# Patient Record
Sex: Male | Born: 1960 | Race: Black or African American | Hispanic: No | Marital: Married | Smoking: Never smoker
Health system: Southern US, Community
[De-identification: ages and names within clinical notes are randomized; demographics above are authoritative.]

## PROBLEM LIST (undated history)

## (undated) DIAGNOSIS — Z8719 Personal history of other diseases of the digestive system: Secondary | ICD-10-CM

## (undated) DIAGNOSIS — R002 Palpitations: Secondary | ICD-10-CM

## (undated) DIAGNOSIS — Z9989 Dependence on other enabling machines and devices: Secondary | ICD-10-CM

## (undated) DIAGNOSIS — E7800 Pure hypercholesterolemia, unspecified: Secondary | ICD-10-CM

## (undated) DIAGNOSIS — I208 Other forms of angina pectoris: Secondary | ICD-10-CM

## (undated) DIAGNOSIS — R7303 Prediabetes: Secondary | ICD-10-CM

## (undated) DIAGNOSIS — I10 Essential (primary) hypertension: Secondary | ICD-10-CM

## (undated) DIAGNOSIS — K219 Gastro-esophageal reflux disease without esophagitis: Secondary | ICD-10-CM

## (undated) DIAGNOSIS — M1990 Unspecified osteoarthritis, unspecified site: Secondary | ICD-10-CM

## (undated) DIAGNOSIS — G4733 Obstructive sleep apnea (adult) (pediatric): Secondary | ICD-10-CM

## (undated) DIAGNOSIS — J302 Other seasonal allergic rhinitis: Secondary | ICD-10-CM

## (undated) DIAGNOSIS — I2089 Other forms of angina pectoris: Secondary | ICD-10-CM

## (undated) HISTORY — PX: FRACTURE SURGERY: SHX138

---
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
## 1990-07-15 HISTORY — PX: NASAL SEPTUM SURGERY: SHX37

## 1998-04-18 ENCOUNTER — Ambulatory Visit (HOSPITAL_COMMUNITY): Admission: RE | Admit: 1998-04-18 | Discharge: 1998-04-18 | Payer: Self-pay | Admitting: *Deleted

## 2000-11-26 ENCOUNTER — Ambulatory Visit (HOSPITAL_COMMUNITY): Admission: RE | Admit: 2000-11-26 | Discharge: 2000-11-26 | Payer: Self-pay | Admitting: Surgery

## 2000-11-26 ENCOUNTER — Encounter (INDEPENDENT_AMBULATORY_CARE_PROVIDER_SITE_OTHER): Payer: Self-pay | Admitting: Specialist

## 2002-07-15 HISTORY — PX: LIPOMA EXCISION: SHX5283

## 2003-10-10 ENCOUNTER — Ambulatory Visit (HOSPITAL_COMMUNITY): Admission: RE | Admit: 2003-10-10 | Discharge: 2003-10-10 | Payer: Self-pay | Admitting: Family Medicine

## 2004-07-15 HISTORY — PX: ANKLE HARDWARE REMOVAL: SHX1149

## 2004-07-20 ENCOUNTER — Ambulatory Visit (HOSPITAL_BASED_OUTPATIENT_CLINIC_OR_DEPARTMENT_OTHER): Admission: RE | Admit: 2004-07-20 | Discharge: 2004-07-20 | Payer: Self-pay | Admitting: Family Medicine

## 2004-07-20 ENCOUNTER — Ambulatory Visit: Payer: Self-pay | Admitting: Internal Medicine

## 2005-11-13 ENCOUNTER — Encounter: Payer: Self-pay | Admitting: *Deleted

## 2006-07-15 HISTORY — PX: UVULOPALATOPHARYNGOPLASTY: SHX827

## 2006-07-15 HISTORY — PX: TONSILLECTOMY: SUR1361

## 2006-08-15 ENCOUNTER — Encounter (INDEPENDENT_AMBULATORY_CARE_PROVIDER_SITE_OTHER): Payer: Self-pay | Admitting: Specialist

## 2006-08-15 ENCOUNTER — Ambulatory Visit (HOSPITAL_COMMUNITY): Admission: RE | Admit: 2006-08-15 | Discharge: 2006-08-16 | Payer: Self-pay | Admitting: Otolaryngology

## 2006-10-30 ENCOUNTER — Ambulatory Visit (HOSPITAL_BASED_OUTPATIENT_CLINIC_OR_DEPARTMENT_OTHER): Admission: RE | Admit: 2006-10-30 | Discharge: 2006-10-30 | Payer: Self-pay | Admitting: Otolaryngology

## 2006-11-02 ENCOUNTER — Ambulatory Visit: Payer: Self-pay | Admitting: Internal Medicine

## 2006-12-16 ENCOUNTER — Other Ambulatory Visit: Admission: RE | Admit: 2006-12-16 | Discharge: 2006-12-16 | Payer: Self-pay | Admitting: Otolaryngology

## 2009-04-10 ENCOUNTER — Encounter: Admission: RE | Admit: 2009-04-10 | Discharge: 2009-04-10 | Payer: Self-pay | Admitting: Family Medicine

## 2010-01-19 ENCOUNTER — Encounter: Admission: RE | Admit: 2010-01-19 | Discharge: 2010-01-19 | Payer: Self-pay | Admitting: Family Medicine

## 2010-08-06 ENCOUNTER — Encounter: Payer: Self-pay | Admitting: Family Medicine

## 2010-11-30 NOTE — Op Note (Signed)
NAMEAURTHUR, WINGERTER NO.:  [id number]   MEDICAL RECORD NO.:  [id number]          PATIENT TYPE:  OIB   LOCATION:  2550                         FACILITY:  [facility]   PHYSICIAN:  Kinnie Scales. Annalee Genta, M.D.DATE OF BIRTH:  09/29/1960   DATE OF PROCEDURE:  08/15/2006  DATE OF DISCHARGE:                               OPERATIVE REPORT   PREOPERATIVE DIAGNOSES:  1. Severe obstructive sleep apnea.  2. Uvulopalatal hypertrophy.  3. Tonsillar hypertrophy.   POSTOPERATIVE DIAGNOSES:  1. Severe obstructive sleep apnea.  2. Uvulopalatal hypertrophy.  3. Tonsillar hypertrophy.   INDICATIONS FOR SURGERY:  1. Severe obstructive sleep apnea.  2. Uvulopalatal hypertrophy.  3. Tonsillar hypertrophy.   SURGICAL PROCEDURES:  1. Tonsillectomy.  2. Uvulopalatopharyngoplasty.   SURGEON:  Kinnie Scales. Annalee Genta, M.D.   COMPLICATIONS:  None.   BLOOD LOSS:  Approximately 50 mL.   ANESTHESIA:  General endotracheal.   The patient was transferred from the operating room to the recovery room  in stable condition.   BRIEF HISTORY:  Mr. Fludd is a 50 year old black male who is  referred for evaluation of obstructive sleep apnea.  The patient has  significant sleep apnea and had attempted long-term use of CPAP with  some limited success.  He is unable to wear the device comfortably on a  nightly basis.  He had a history of prior nasal surgery and had an  anterior nasal septal perforation.  Examination in the office revealed  significant enlargement of the tonsils, palate, uvula, and tongue base.  Given his history and physical examination including prior sleep study,  I recommended that he undertake tonsillectomy and  uvulopalatopharyngoplasty.  The risks, benefits and possible  complications of the surgical procedure were discussed in detail with  the patient, who understood and concurred with our plan for surgery,  which was scheduled at [facility] OR under  general  anesthesia.   PROCEDURE:  The patient was brought into the operating room August 15, 2006, placed in the supine position on the operating table.  General  endotracheal anesthesia was established without difficulty.  When the  patient was adequately anesthetized his oral cavity and oropharynx were  examined.  There were no loose or broken teeth, and the hard and soft  palate were intact.  The surgical procedure was begun with  tonsillectomy.  Using Bovie electrocautery and dissecting in a  subcapsular fashion, the entire left tonsil was resected from superior  pole to tongue base.  The right tonsil was removed in a similar fashion  and tonsil tissue was sent to pathology for gross and microscopic  evaluation.  The tonsillar fossae were gently abraded a dry tonsil  sponge and several areas of point hemorrhage were cauterized using  suction cautery.   When the tonsillectomy had been completed, uvulopalatopharyngoplasty was  undertaken.  The posterior extent of the soft palate was measured, and  the patient had approximately 1-1.5 cm of excessive soft tissue  including a significantly thickened uvula.  Dissection was carried out  along the anterior tonsillar pillars, dissecting through the anterior  palatal mucosa with Bovie electrocautery.  The  palatal musculature was  then dissected and a posterior mucosal flap was elevated.  The entire  uvula and a portion of the palate was resected and sent to pathology.  The posterior tonsillar pillars were left intact.  The area was  cauterized at several points of bleeding and then the soft tissue was  reconstructed using a 3-0 Vicryl suture on a tapered needle in an  interrupted fashion.  Along the posterior tonsillar pillars, horizontal  mattressing sutures were placed to advance the posterior pillars  anteriorly and close the tonsillar fossae along the poster palatal  margin.  Interrupted Vicryl sutures were placed in order to   reapproximate the mucosa.  The patient's oral cavity and oropharynx were  irrigated and suctioned and an orogastric tube was passed.  Stomach  contents were aspirated.  The Crowe-Davis mouth gag was released  reapplied.  There was no active bleeding.  Mouth gag was removed.  There  were no loose or broken teeth.  The patient was then awakened from his  anesthetic.  He was extubated.  He was then transferred from the  operating room to the recovery room in stable condition.  No  complications.  Blood loss less than 50 mL.           ______________________________  Kinnie Scales. Annalee Genta, M.D.     DLS/MEDQ  D:  16/04/9603  T:  08/15/2006  Job:  [id number]

## 2010-11-30 NOTE — Op Note (Signed)
[facility]  Patient:    Joshua Stanton, Joshua Stanton                 MRN: [id number] Proc. Date: 11/26/00 Adm. Date:  [id number] Attending:  Charlton Haws CC:         Arvella Merles, M.D.   Operative Report  ACCOUNT:  [id number]  [id number]  PREOPERATIVE DIAGNOSIS:  Recurrent lipoma of right posterior neck.  POSTOPERATIVE DIAGNOSIS:  Recurrent lipoma of right posterior neck.  OPERATION:  Excision of lipoma right posterior neck.  SURGEON:  Currie Paris, M.D.  ANESTHESIA:  General.  CLINICAL HISTORY:  This patient is a 50 year old male, who has got a recurrent lipoma following liposuction x 2 in the past.  It was getting somewhat larger and causing some discomfort, so he desired to have this removed.  DESCRIPTION OF PROCEDURE:  In the holding area, the area in question was identified and marked.  He was taken to the operating room and after satisfactory general endotracheal anesthesia had been obtained, was placed on the operating table in the prone position with appropriate padding.  However, this man has a fairly short neck, and this did not give [place] exposure and, in fact, made the mass more difficult to see.  We therefore moved him back to the stretcher and put him in the left side down lateral position with appropriate padding, and this gave [place] excellent exposure to the mass.  The neck was then prepped and draped.  I made an incision over it and excised some of the subcutaneous fatty tissue, trying to get around where I thought the mass was, but it was actually deeper and below some scar tissue and below a little bit of muscle tissue, but once I got down to it, it came out as a nice, well circumscribed lipoma.  It was excised in toto.  Bleeders were controlled with the cautery.  Once everything appeared to be dry, I injected some 0.25% Marcaine in the deeper layers to help with postoperative pain relief and then closed in two layers  using 3-0 Vicryl, one to close what appeared to be some old scar or perhaps fascia.  It was difficult to identify because of the prior liposuction, and then tacked the skin flaps down with some 3-0 Vicryl and then closed the skin with 4-0 Monocryl subcuticular plus Dermabond.  The patient tolerated the procedure well with no operative complications.  All counts were correct. DD:  11/26/00 TD:  11/26/00 Job: [id number]

## 2010-11-30 NOTE — Procedures (Signed)
NAME:  Joshua Stanton, Joshua Stanton NO.:  [id number]   MEDICAL RECORD NO.:  [id number]          PATIENT TYPE:  OUT   LOCATION:  SLEEP CENTER                 FACILITY:  [facility]   PHYSICIAN:  Clinton D. Maple Hudson, M.D. DATE OF BIRTH:  07-22-1960   DATE OF STUDY:                              NOCTURNAL POLYSOMNOGRAM   REFERRING PHYSICIAN:  Dr. Noberto Retort, IV.   INDICATION FOR STUDY AND HISTORY:  Hypersomnia with sleep apnea.   Epworth sleepiness score 13/24, BMI 40.7, weight 260 pounds, neck size 20  inches.   SLEEP ARCHITECTURE:  Short total sleep time 281 minutes with sleep  efficiency of 80%.  Stage I was 7%; stage II, 79%; Stages III and IV 50.  REM was 9% of total sleep time.  Latency to sleep onset 7 minutes, latency  to REM 73 minutes.  Awake after sleep onset 54 minutes.  Arousal index 40.   RESPIRATORY DATA:  Split study protocol.  Respiratory disturbance index  (RDI) 92.6 obstructive events per hour before CPAP.  This included 151  obstructive apneas and 48 hypopneas before CPAP.  Events were not  positional.  REM RDI 65.9 per hour.  CPAP was titrated to 13 CWP, RDI 3.9  per hour using a medium Comfort Gel mask with heated humidifier.   OXYGEN DATA:  Very loud snoring with oxygen desaturation to a nadir of 73%  before CPAP. After CPAP control, saturation held 92 to 96% on room air.  Snoring was eliminated by CPAP.   CARDIAC DATA:  Normal sinus rhythm.   MOVEMENT AND PARASOMNIA:  Occasional arousal by a leg jerk at an  insignificant 1.3 per hour.  Bathroom x 1.   IMPRESSION AND RECOMMENDATIONS:  1.  Severe obstructive sleep apnea/hypopnea syndrome, RDI 92.6 per hour with      oxygen desaturation to 73%.  2.  Successful CPAP titration to 13 CWP, RDI 3.9 per hour, using a medium      Comfort Gel mask with heated humidifier.   Clinton D. Maple Hudson, M.D.  Diplomate, Biomedical engineer of Sleep Medicine                                                           Clinton  D. Maple Hudson, M.D.  Diplomate, [employer] Board   CDY/MEDQ  D:  07/22/2004 12:20:28  T:  07/22/2004 14:44:50  Job:  [id number]   cc:   Noberto Retort, IV

## 2010-11-30 NOTE — Procedures (Signed)
NAMEHEARL, HEIKES NO.:  [id number]   MEDICAL RECORD NO.:  [id number]          PATIENT TYPE:  OUT   LOCATION:  SLEEP CENTER                 FACILITY:  [facility]   PHYSICIAN:  Clinton D. Maple Hudson, MD, FCCP, FACPDATE OF BIRTH:  [birth year]08-1960   DATE OF STUDY:  10/30/2006                            NOCTURNAL POLYSOMNOGRAM   REFERRING PHYSICIAN:  Onalee Hua L. Annalee Genta, M.D.   INDICATION FOR STUDY:  Hypersomnia with sleep apnea.   EPWORTH SLEEPINESS SCORE:  10/24.  BMI 39.1, weight 250 pounds.   MEDICATIONS:  Home medications are listed and reviewed.   A baseline study on 07/20/2004 recorded an AHI of 92.6 per hour.  CPAP  titration at that time to 13 CWP gave an AHI of 3.9 per hour. The  patient has since had surgery.   SLEEP ARCHITECTURE:  Total sleep time 304 minutes with sleep efficiency  79%.  Stage I is 11%, stage II 78%, stages III and IV absent. REM 12% of  total sleep time. Sleep latency 19 minutes, REM latency 148 minutes,  awake after sleep onset 52 minutes, arousal index 15.8.  Ambien 10 mg  was taken at bedtime.   RESPIRATORY DATA:  Split study protocol.  Apnea/hypopnea index (AHI,  RDI) 58.9 obstructive events per hour indicating severe obstructive  sleep apnea/hypopnea syndrome, significantly improved from original  study.  There were 75 obstructive apnea's and 53 hypopnea's.  Events  were not positional. REM AHI 60.  CPAP was titrated to 10 CWP, AHI 25.5  per hour within the available time.   OXYGEN DATA:  Moderate snoring with oxygen desaturation to a nadir of  79% before CPAP.  With CPAP control saturation was still around 91% on  room air.   CARDIAC DATA:  Normal sinus rhythm.   MOVEMENT-PARASOMNIA:  Occasional limb jerk, insignificant.   IMPRESSIONS-RECOMMENDATIONS:  1. Severe obstructive sleep apnea/hypopnea syndrome, AHI 58.9 per hour      with non positional events. Moderate snoring and oxygen      desaturation to a nadir of 79%.  2. CPAP  titration ended at 10 CWP with a residual AHI of 25. 5 per      hour.  Consider target CPAP range around 13 CWP.  A comfort fusion      Respironics mask (small) was used with heated humidifier.  3. Prior sleep study on 07/20/2004 had recorded an AHI of 92.6 per hour      with CPAP titrated to 13 CWP for an AHI of 3.9 at that date.      Clinton D. Maple Hudson, MD, [facility], FACP  Diplomate, Biomedical engineer of Sleep Medicine  Electronically Signed     CDY/MEDQ  D:  11/02/2006 14:32:13  T:  11/02/2006 21:22:53  Job:  [id number]

## 2011-09-05 ENCOUNTER — Other Ambulatory Visit: Payer: Self-pay | Admitting: Orthopedic Surgery

## 2011-09-17 ENCOUNTER — Encounter (HOSPITAL_BASED_OUTPATIENT_CLINIC_OR_DEPARTMENT_OTHER): Payer: Self-pay | Admitting: *Deleted

## 2011-09-17 NOTE — Progress Notes (Signed)
Pt instructed npo p mn 3/7.  To [facility] 3/8 at 0645.  Needs istat on arrival. Pt to do hibiclens shower hs 3/7.  ekg requested from Dr. Holley Bouche.

## 2011-09-19 NOTE — H&P (Signed)
  CC- Joshua Stanton is a 51 y.o. male who presents with right knee pain.  HPI- . Knee Pain: Patient presents with knee pain involving the  right knee. Onset of the symptoms was several months ago. Inciting event: none known. Current symptoms include crepitus sensation, giving out, pain located medially and stiffness. Pain is aggravated by going up and down stairs, kneeling, pivoting and squatting.  Patient has had no prior knee problems. Evaluation to date: MRI: abnormal medial meniscal tear.   Past Medical History  Diagnosis Date  . Sleep apnea     uses cpap  . Hypertension   . GERD (gastroesophageal reflux disease)     Past Surgical History  Procedure Date  . Tonsillectomy   . Fracture surgery 1981    orif l ankle  . Nasal septum surgery 1992  . Lipoma excision 2004    rt neck  . Ankle hardware removal 2006    left   . Uvulopalatopharyngoplasty 2008    Prior to Admission medications   Medication Sig Start Date End Date Taking? Authorizing Provider  eszopiclone (LUNESTA) 1 MG TABS Take 2 mg by mouth at bedtime. Take immediately before bedtime   Yes Historical Provider, MD  fexofenadine (ALLEGRA) 180 MG tablet Take 180 mg by mouth as needed.   Yes Historical Provider, MD  fluticasone (FLONASE) 50 MCG/ACT nasal spray Place 2 sprays into the nose as needed.   Yes Historical Provider, MD  testosterone (ANDROGEL) 50 MG/5GM GEL Place 5 g onto the skin daily.   Yes Historical Provider, MD  triamterene-hydrochlorothiazide (MAXZIDE-25) 37.5-25 MG per tablet Take 1 tablet by mouth daily.   Yes Historical Provider, MD   Knee Exam antalgic gait, soft tissue tenderness over medial joint line, reduced range of motion, negative pivot-shift, collateral ligaments intact, normal ipsilateral hip exam  Physical Examination: General appearance - alert, well appearing, and in no distress Mental status - alert, oriented to person, place, and time Chest - clear to auscultation, no wheezes,  rales or rhonchi, symmetric air entry Heart - normal rate, regular rhythm, normal S1, S2, no murmurs, rubs, clicks or gallops Abdomen - soft, nontender, nondistended, no masses or organomegaly Neurological - alert, oriented, normal speech, no focal findings or movement disorder noted   Asessment/Plan--- Right knee medial meniscal tear- - Plan right knee arthroscopy with meniscal debridement. Procedure risks and potential comps discussed with patient who elects to proceed. Goals are decreased pain and increased function with a high likelihood of achieving both

## 2011-09-20 ENCOUNTER — Encounter (HOSPITAL_BASED_OUTPATIENT_CLINIC_OR_DEPARTMENT_OTHER): Payer: Self-pay | Admitting: Anesthesiology

## 2011-09-20 ENCOUNTER — Ambulatory Visit (HOSPITAL_BASED_OUTPATIENT_CLINIC_OR_DEPARTMENT_OTHER)
Admission: RE | Admit: 2011-09-20 | Discharge: 2011-09-20 | Disposition: A | Discharge status: Home / Self Care | Payer: 59 | Source: Ambulatory Visit | Attending: Orthopedic Surgery | Admitting: Orthopedic Surgery

## 2011-09-20 ENCOUNTER — Encounter (HOSPITAL_BASED_OUTPATIENT_CLINIC_OR_DEPARTMENT_OTHER): Payer: Self-pay | Admitting: *Deleted

## 2011-09-20 ENCOUNTER — Encounter (HOSPITAL_BASED_OUTPATIENT_CLINIC_OR_DEPARTMENT_OTHER)
Admission: RE | Disposition: A | Discharge status: Home / Self Care | Payer: Self-pay | Source: Ambulatory Visit | Attending: Orthopedic Surgery

## 2011-09-20 ENCOUNTER — Ambulatory Visit (HOSPITAL_BASED_OUTPATIENT_CLINIC_OR_DEPARTMENT_OTHER): Payer: 59 | Admitting: Anesthesiology

## 2011-09-20 DIAGNOSIS — IMO0002 Reserved for concepts with insufficient information to code with codable children: Secondary | ICD-10-CM | POA: Insufficient documentation

## 2011-09-20 DIAGNOSIS — R269 Unspecified abnormalities of gait and mobility: Secondary | ICD-10-CM | POA: Insufficient documentation

## 2011-09-20 DIAGNOSIS — X58XXXA Exposure to other specified factors, initial encounter: Secondary | ICD-10-CM | POA: Insufficient documentation

## 2011-09-20 DIAGNOSIS — M25569 Pain in unspecified knee: Secondary | ICD-10-CM | POA: Insufficient documentation

## 2011-09-20 DIAGNOSIS — S83241A Other tear of medial meniscus, current injury, right knee, initial encounter: Secondary | ICD-10-CM

## 2011-09-20 DIAGNOSIS — Z79899 Other long term (current) drug therapy: Secondary | ICD-10-CM | POA: Insufficient documentation

## 2011-09-20 DIAGNOSIS — I10 Essential (primary) hypertension: Secondary | ICD-10-CM | POA: Insufficient documentation

## 2011-09-20 DIAGNOSIS — G4730 Sleep apnea, unspecified: Secondary | ICD-10-CM | POA: Insufficient documentation

## 2011-09-20 DIAGNOSIS — M9420 Chondromalacia, unspecified site: Secondary | ICD-10-CM | POA: Insufficient documentation

## 2011-09-20 DIAGNOSIS — K219 Gastro-esophageal reflux disease without esophagitis: Secondary | ICD-10-CM | POA: Insufficient documentation

## 2011-09-20 HISTORY — PX: KNEE ARTHROSCOPY: SHX127

## 2011-09-20 HISTORY — DX: Gastro-esophageal reflux disease without esophagitis: K21.9

## 2011-09-20 HISTORY — DX: Essential (primary) hypertension: I10

## 2011-09-20 LAB — POCT I-STAT 4, (NA,K, GLUC, HGB,HCT): Sodium: 143 mEq/L (ref 135–145)

## 2011-09-20 SURGERY — ARTHROSCOPY, KNEE
Anesthesia: General | Site: Knee | Laterality: Right

## 2011-09-20 MED ORDER — ONDANSETRON HCL 4 MG/2ML IJ SOLN
INTRAMUSCULAR | Status: DC | PRN
  Administered 2011-09-20: 4 mg via INTRAVENOUS

## 2011-09-20 MED ORDER — OXYCODONE-ACETAMINOPHEN 5-325 MG PO TABS
1.0000 | ORAL_TABLET | ORAL | Status: AC | PRN
Start: 2011-09-20 — End: 2011-09-30

## 2011-09-20 MED ORDER — SODIUM CHLORIDE 0.9 % IR SOLN
Status: DC | PRN
  Administered 2011-09-20: 6000 mL

## 2011-09-20 MED ORDER — METHOCARBAMOL 500 MG PO TABS
500.0000 mg | ORAL_TABLET | Freq: Three times a day (TID) | ORAL | Status: AC
  Administered 2011-09-20: 500 mg via ORAL

## 2011-09-20 MED ORDER — FENTANYL CITRATE 0.05 MG/ML IJ SOLN
INTRAMUSCULAR | Status: DC | PRN
  Administered 2011-09-20: 25 ug via INTRAVENOUS
  Administered 2011-09-20: 50 ug via INTRAVENOUS
  Administered 2011-09-20: 25 ug via INTRAVENOUS
  Administered 2011-09-20: 50 ug via INTRAVENOUS
  Administered 2011-09-20 (×2): 25 ug via INTRAVENOUS

## 2011-09-20 MED ORDER — OXYCODONE-ACETAMINOPHEN 5-325 MG PO TABS
1.0000 | ORAL_TABLET | ORAL | Status: AC | PRN
  Administered 2011-09-20: 1 via ORAL

## 2011-09-20 MED ORDER — LACTATED RINGERS IV SOLN
INTRAVENOUS | Status: DC
  Administered 2011-09-20 (×3): via INTRAVENOUS

## 2011-09-20 MED ORDER — CHLORHEXIDINE GLUCONATE 4 % EX LIQD: 60.0000 mL | Freq: Once | CUTANEOUS | Status: DC

## 2011-09-20 MED ORDER — DEXAMETHASONE SODIUM PHOSPHATE 4 MG/ML IJ SOLN
INTRAMUSCULAR | Status: DC | PRN
  Administered 2011-09-20: 8 mg via INTRAVENOUS

## 2011-09-20 MED ORDER — LIDOCAINE HCL (CARDIAC) 20 MG/ML IV SOLN
INTRAVENOUS | Status: DC | PRN
  Administered 2011-09-20: 100 mg via INTRAVENOUS

## 2011-09-20 MED ORDER — FENTANYL CITRATE 0.05 MG/ML IJ SOLN
25.0000 ug | INTRAMUSCULAR | Status: DC | PRN
  Administered 2011-09-20: 50 ug via INTRAVENOUS
  Administered 2011-09-20 (×2): 25 ug via INTRAVENOUS

## 2011-09-20 MED ORDER — CEFAZOLIN SODIUM-DEXTROSE 2-3 GM-% IV SOLR: 2.0000 g | INTRAVENOUS | Status: DC

## 2011-09-20 MED ORDER — PROPOFOL 10 MG/ML IV EMUL
INTRAVENOUS | Status: DC | PRN
  Administered 2011-09-20: 250 mg via INTRAVENOUS

## 2011-09-20 MED ORDER — DEXAMETHASONE SODIUM PHOSPHATE 10 MG/ML IJ SOLN: 10.0000 mg | Freq: Once | INTRAMUSCULAR | Status: DC

## 2011-09-20 MED ORDER — SODIUM CHLORIDE 0.9 % IV SOLN: INTRAVENOUS | Status: DC

## 2011-09-20 MED ORDER — METHOCARBAMOL 500 MG PO TABS: 500.0000 mg | ORAL_TABLET | Freq: Four times a day (QID) | ORAL | Status: AC

## 2011-09-20 MED ORDER — BUPIVACAINE-EPINEPHRINE 0.25% -1:200000 IJ SOLN
INTRAMUSCULAR | Status: DC | PRN
  Administered 2011-09-20: 20 mL

## 2011-09-20 MED ORDER — ACETAMINOPHEN 10 MG/ML IV SOLN
1000.0000 mg | Freq: Once | INTRAVENOUS | Status: AC
  Administered 2011-09-20: 1000 mg via INTRAVENOUS

## 2011-09-20 MED ORDER — LACTATED RINGERS IV SOLN: INTRAVENOUS | Status: DC

## 2011-09-20 SURGICAL SUPPLY — 37 items
BANDAGE ELASTIC 6 VELCRO ST LF (GAUZE/BANDAGES/DRESSINGS) ×2 IMPLANT
BLADE 4.2CUDA (BLADE) ×2 IMPLANT
BLADE CUDA SHAVER 3.5 (BLADE) IMPLANT
BLADE CUTTER GATOR 3.5 (BLADE) IMPLANT
CANISTER SUCT LVC 12 LTR MEDI- (MISCELLANEOUS) ×2 IMPLANT
CANISTER SUCTION 2500CC (MISCELLANEOUS) IMPLANT
CLOTH BEACON ORANGE TIMEOUT ST (SAFETY) ×2 IMPLANT
DRAPE ARTHROSCOPY W/POUCH 114 (DRAPES) ×2 IMPLANT
DRSG EMULSION OIL 3X3 NADH (GAUZE/BANDAGES/DRESSINGS) ×2 IMPLANT
DRSG PAD ABDOMINAL 8X10 ST (GAUZE/BANDAGES/DRESSINGS) ×2 IMPLANT
DURAPREP 26ML APPLICATOR (WOUND CARE) ×2 IMPLANT
ELECT MENISCUS 165MM 90D (ELECTRODE) IMPLANT
ELECT REM PT RETURN 9FT ADLT (ELECTROSURGICAL)
ELECTRODE REM PT RTRN 9FT ADLT (ELECTROSURGICAL) IMPLANT
GLOVE BIO SURGEON STRL SZ8 (GLOVE) ×2 IMPLANT
GLOVE BIOGEL PI IND STRL 6.5 (GLOVE) ×1 IMPLANT
GLOVE BIOGEL PI INDICATOR 6.5 (GLOVE) ×1
GLOVE ECLIPSE 6.5 STRL STRAW (GLOVE) ×2 IMPLANT
GLOVE INDICATOR 8.0 STRL GRN (GLOVE) ×2 IMPLANT
GOWN PREVENTION PLUS LG XLONG (DISPOSABLE) ×4 IMPLANT
IV NS IRRIG 3000ML ARTHROMATIC (IV SOLUTION) ×2 IMPLANT
KNEE WRAP E Z 3 GEL PACK (MISCELLANEOUS) ×2 IMPLANT
PACK ARTHROSCOPY DSU (CUSTOM PROCEDURE TRAY) ×2 IMPLANT
PACK BASIN DAY SURGERY FS (CUSTOM PROCEDURE TRAY) ×2 IMPLANT
PADDING CAST ABS 4INX4YD NS (CAST SUPPLIES) ×1
PADDING CAST ABS COTTON 4X4 ST (CAST SUPPLIES) ×1 IMPLANT
PADDING CAST COTTON 6X4 STRL (CAST SUPPLIES) ×2 IMPLANT
PADDING WEBRIL 6 STERILE (GAUZE/BANDAGES/DRESSINGS) ×2 IMPLANT
PENCIL BUTTON HOLSTER BLD 10FT (ELECTRODE) IMPLANT
SET ARTHROSCOPY TUBING (MISCELLANEOUS) ×1
SET ARTHROSCOPY TUBING LN (MISCELLANEOUS) ×1 IMPLANT
SPONGE GAUZE 4X4 12PLY (GAUZE/BANDAGES/DRESSINGS) ×2 IMPLANT
SUT ETHILON 4 0 PS 2 18 (SUTURE) ×2 IMPLANT
TOWEL OR 17X24 6PK STRL BLUE (TOWEL DISPOSABLE) ×2 IMPLANT
WAND 30 DEG SABER W/CORD (SURGICAL WAND) IMPLANT
WAND 90 DEG TURBOVAC W/CORD (SURGICAL WAND) ×2 IMPLANT
WATER STERILE IRR 500ML POUR (IV SOLUTION) ×2 IMPLANT

## 2011-09-20 NOTE — Transfer of Care (Signed)
Immediate Anesthesia Transfer of Care Note  Patient: Joshua Stanton  Procedure(s) Performed: Procedure(s) (LRB): ARTHROSCOPY KNEE (Right)  Patient Location: Patient transported to PACU with oxygen via face mask at 4 Liters / Min  Anesthesia Type: General  Level of Consciousness: awake and alert   Airway & Oxygen Therapy: Patient Spontanous Breathing and Patient connected to face mask oxygen  Post-op Assessment: Report given to PACU RN and Post -op Vital signs reviewed and stable  Post vital signs: Reviewed and stable  Dentition: Teeth and oropharynx remain in pre-op condition  Complications: No apparent anesthesia complications

## 2011-09-20 NOTE — Anesthesia Preprocedure Evaluation (Addendum)
Anesthesia Evaluation  Patient identified by MRN, date of birth, ID band Patient awake    Reviewed: Allergy & Precautions, H&P , NPO status , Patient's Chart, lab work & pertinent test results, reviewed documented beta blocker date and time   Airway Mallampati: III TM Distance: >3 FB Neck ROM: full    Dental No notable dental hx. (+) Teeth Intact and Dental Advisory Given   Pulmonary neg pulmonary ROS, sleep apnea and Continuous Positive Airway Pressure Ventilation ,  breath sounds clear to auscultation  Pulmonary exam normal       Cardiovascular Exercise Tolerance: Good hypertension, Pt. on medications negative cardio ROS  Rhythm:regular Rate:Normal     Neuro/Psych negative neurological ROS  negative psych ROS   GI/Hepatic negative GI ROS, Neg liver ROS, GERD-  Controlled,  Endo/Other  negative endocrine ROS  Renal/GU negative Renal ROS  negative genitourinary   Musculoskeletal   Abdominal   Peds  Hematology negative hematology ROS (+)   Anesthesia Other Findings   Reproductive/Obstetrics negative OB ROS                          Anesthesia Physical Anesthesia Plan  ASA: III  Anesthesia Plan: General   Post-op Pain Management:    Induction:   Airway Management Planned: LMA  Additional Equipment:   Intra-op Plan:   Post-operative Plan:   Informed Consent: I have reviewed the patients History and Physical, chart, labs and discussed the procedure including the risks, benefits and alternatives for the proposed anesthesia with the patient or authorized representative who has indicated his/her understanding and acceptance.   Dental Advisory Given  Plan Discussed with: CRNA and Surgeon  Anesthesia Plan Comments:        Anesthesia Quick Evaluation

## 2011-09-20 NOTE — Op Note (Signed)
Preoperative diagnosis-  Right knee medial meniscal tear  Postoperative diagnosis Right- knee medial meniscal tear   Plus Right  Medial chondral defect  Procedure- Right knee arthroscopy with medial  Meniscal debridement and chondroplasty  Surgeon- Gus Rankin. Ronit Cranfield, MD  Anesthesia-General  EBL-  minimal Complications- None  Condition- PACU - hemodynamically stable.  Brief clinical note- -Joshua Stanton is a 51 y.o.  male with a several month history of right knee pain and mechanical symptoms. Exam and history suggested medial meniscal tear confirmed by MRI. The patient presents now for arthroscopy and debridement   Procedure in detail -       After successful administration of General anesthetic, a tourmiquet is placed high on the Right  thigh and the Right lower extremity is prepped and draped in the usual sterile fashion. Time out is performed by the surgical team. Standard superomedial and inferolateral portal sites are marked and incisions made with an 11 blade. The inflow cannula is passed through the superomedial portal and camera through the inferolateral portal and inflow is initiated. Arthroscopic visualization proceeds.      The undersurface of the patella and trochlea are visualized and they appear normal. The medial and lateral gutters are visualized and there are no loose bodies. Flexion and valgus force is applied to the knee and the medial compartment is entered. A spinal needle is passed into the joint through the site marked for the inferomedial portal. A small incision is made and the dilator passed into the joint. The findings for the medial compartment are unstable tear of the body and posterior horn of the medial meniscus with a 1 x 1 cm medial femoral chondral defect . The tear is debrided to a stable base with baskets and a shaver and sealed off with the Arthrocare. The shaver is used to debride the unstable cartilage to a stable  cartilaginous base with stable  edges. It is probed and found to be stable.    The intercondylar notch is visualized and the ACL appears attenuated but intact. The lateral compartment is entered and the findings are normal .       The joint is again inspected and there are no other tears, defects or loose bodies identified. The arthroscopic equipment is then removed from the inferior portals which are closed with interrupted 4-0 nylon. 20 ml of .25% Marcaine with epinephrine are injected through the inflow cannula and the cannula is then removed and the portal closed with nylon. The incisions are cleaned and dried and a bulky sterile dressing is applied. The patient is then awakened and transported to recovery in stable condition.   09/20/2011, 9:29 AM

## 2011-09-20 NOTE — Anesthesia Postprocedure Evaluation (Signed)
  Anesthesia Post-op Note  Patient: Joshua Stanton  Procedure(s) Performed: Procedure(s) (LRB): ARTHROSCOPY KNEE (Right)  Patient Location: PACU  Anesthesia Type: General  Level of Consciousness: awake and alert   Airway and Oxygen Therapy: Patient Spontanous Breathing  Post-op Pain: mild  Post-op Assessment: Post-op Vital signs reviewed, Patient's Cardiovascular Status Stable, Respiratory Function Stable, Patent Airway and No signs of Nausea or vomiting  Post-op Vital Signs: stable  Complications: No apparent anesthesia complications

## 2011-09-20 NOTE — Interval H&P Note (Signed)
History and Physical Interval Note:  09/20/2011 8:28 AM  Joshua Stanton  has presented today for surgery, with the diagnosis of RIGHT KNEE MEDIAL MENISCAL TEAR  The various methods of treatment have been discussed with the patient and family. After consideration of risks, benefits and other options for treatment, the patient has consented to  Procedure(s) (LRB): ARTHROSCOPY KNEE (Right) as a surgical intervention .  The patients' history has been reviewed, patient examined, no change in status, stable for surgery.  I have reviewed the patients' chart and labs.  Questions were answered to the patient's satisfaction.     Loanne Drilling

## 2011-09-23 ENCOUNTER — Encounter (HOSPITAL_BASED_OUTPATIENT_CLINIC_OR_DEPARTMENT_OTHER): Payer: Self-pay | Admitting: Orthopedic Surgery

## 2011-09-26 ENCOUNTER — Encounter (HOSPITAL_BASED_OUTPATIENT_CLINIC_OR_DEPARTMENT_OTHER): Payer: Self-pay

## 2013-01-19 ENCOUNTER — Other Ambulatory Visit: Payer: Self-pay | Admitting: Family Medicine

## 2013-01-19 ENCOUNTER — Ambulatory Visit
Admission: RE | Admit: 2013-01-19 | Discharge: 2013-01-19 | Disposition: A | Discharge status: Home / Self Care | Payer: 59 | Source: Ambulatory Visit | Attending: Family Medicine | Admitting: Family Medicine

## 2013-01-19 DIAGNOSIS — M25572 Pain in left ankle and joints of left foot: Secondary | ICD-10-CM

## 2013-04-01 ENCOUNTER — Encounter (HOSPITAL_COMMUNITY): Payer: Self-pay

## 2013-04-01 ENCOUNTER — Other Ambulatory Visit: Payer: Self-pay | Admitting: Orthopedic Surgery

## 2013-04-01 ENCOUNTER — Encounter (HOSPITAL_COMMUNITY)
Admission: RE | Admit: 2013-04-01 | Discharge: 2013-04-01 | Disposition: A | Discharge status: Home / Self Care | Payer: 59 | Source: Ambulatory Visit | Attending: Orthopedic Surgery | Admitting: Orthopedic Surgery

## 2013-04-01 ENCOUNTER — Ambulatory Visit (HOSPITAL_COMMUNITY)
Admission: RE | Admit: 2013-04-01 | Discharge: 2013-04-01 | Disposition: A | Discharge status: Home / Self Care | Payer: 59 | Source: Ambulatory Visit | Attending: Orthopedic Surgery | Admitting: Orthopedic Surgery

## 2013-04-01 DIAGNOSIS — Z01812 Encounter for preprocedural laboratory examination: Secondary | ICD-10-CM | POA: Insufficient documentation

## 2013-04-01 DIAGNOSIS — I10 Essential (primary) hypertension: Secondary | ICD-10-CM | POA: Insufficient documentation

## 2013-04-01 DIAGNOSIS — I4510 Unspecified right bundle-branch block: Secondary | ICD-10-CM | POA: Insufficient documentation

## 2013-04-01 DIAGNOSIS — Z01818 Encounter for other preprocedural examination: Secondary | ICD-10-CM | POA: Insufficient documentation

## 2013-04-01 DIAGNOSIS — Z01810 Encounter for preprocedural cardiovascular examination: Secondary | ICD-10-CM | POA: Insufficient documentation

## 2013-04-01 DIAGNOSIS — I517 Cardiomegaly: Secondary | ICD-10-CM | POA: Insufficient documentation

## 2013-04-01 HISTORY — DX: Other seasonal allergic rhinitis: J30.2

## 2013-04-01 HISTORY — DX: Unspecified osteoarthritis, unspecified site: M19.90

## 2013-04-01 LAB — CBC
HCT: 43.7 % (ref 39.0–52.0)
Hemoglobin: 14.9 g/dL (ref 13.0–17.0)
MCH: 29.4 pg (ref 26.0–34.0)
MCV: 86.2 fL (ref 78.0–100.0)
RBC: 5.07 MIL/uL (ref 4.22–5.81)

## 2013-04-01 LAB — BASIC METABOLIC PANEL
BUN: 10 mg/dL (ref 6–23)
CO2: 32 mEq/L (ref 19–32)
Calcium: 9.5 mg/dL (ref 8.4–10.5)
Creatinine, Ser: 0.93 mg/dL (ref 0.50–1.35)
Glucose, Bld: 99 mg/dL (ref 70–99)

## 2013-04-01 NOTE — Patient Instructions (Addendum)
[name] [facility]  04/01/2013   Your procedure is scheduled on:  04/07/13  [facility]  Report to [facility] at  1115     AM.  Call this number if you have problems the morning of surgery: [phone]     BRING CPAP MASK AND TUBING WITH YOU TO HOSPITAL  Remember:   Do not eat food  :After Midnight.TUESDAY NIGHT-  MAY DRINK CLEAR LIQUIDS UNTIL 0745 am-  THEN NOTHING BY MOUTH   Take these medicines the morning of surgery with A SIP OF WATER: DEXILIANT                                   May use Flonase  if needed,   May take [facility] or TRAMADOL IF NEEDED   .  Contacts, dentures or partial plates can not be worn to surgery  Leave suitcase in the car. After surgery it may be brought to your room.  For patients admitted to the hospital, checkout time is 11:00 AM day of  discharge.             SPECIAL INSTRUCTIONS- SEE [place] PREPARING FOR SURGERY INSTRUCTION SHEET-     DO NOT WEAR JEWELRY, LOTIONS, POWDERS, OR PERFUMES.  WOMEN-- DO NOT SHAVE LEGS OR UNDERARMS FOR 12 HOURS BEFORE SHOWERS. MEN MAY SHAVE FACE.  Patients discharged the day of surgery will not be allowed to drive home. IF going home the day of surgery, you must have a driver and someone to stay with you for the first 24 hours  Name and phone number of your driver:    [name]                                                                                                Joshua Stanton  PST [phone]  [phone]                 FAILURE TO FOLLOW THESE INSTRUCTIONS MAY RESULT IN  CANCELLATION   OF YOUR SURGERY                                                  Patient Signature _____________________________

## 2013-04-01 NOTE — Progress Notes (Signed)
Drew-  NEED PRE OP ORDERS please    Thank you

## 2013-04-01 NOTE — Progress Notes (Signed)
Faxed BMET and chest x ray to Dr Lequita Halt through [facility]

## 2013-04-02 ENCOUNTER — Other Ambulatory Visit: Payer: Self-pay | Admitting: Orthopedic Surgery

## 2013-04-02 NOTE — Progress Notes (Signed)
Preoperative surgical orders have been place into the Epic hospital system for Joshua Stanton on 04/02/2013, 12:23 PM  by Patrica Duel for surgery on 04/07/2013.  Preop Knee Scope orders including IV Tylenol and IV Decadron as long as there are no contraindications to the above medications. Avel Peace, PA-C

## 2013-04-06 DIAGNOSIS — S83249A Other tear of medial meniscus, current injury, unspecified knee, initial encounter: Secondary | ICD-10-CM

## 2013-04-06 NOTE — H&P (Signed)
  CC- Joshua Stanton is a 51 y.o. male who presents with left knee pain.  HPI- . Knee Pain: Patient presents with knee pain involving the  left knee. Onset of the symptoms was several months ago. Inciting event: none known. Current symptoms include giving out, pain located medial joint line, stiffness and swelling. Pain is aggravated by inactivity, lateral movements, pivoting, rising after sitting, squatting and walking.  Patient has had no prior knee problems. Evaluation to date: MRI: abnormal medial meniscal tear. Treatment to date: corticosteroid injection which was ineffective.  Past Medical History  Diagnosis Date  . Sleep apnea     uses cpap  . Hypertension   . GERD (gastroesophageal reflux disease)   . Seasonal allergies   . Arthritis     Past Surgical History  Procedure Laterality Date  . Tonsillectomy    . Fracture surgery  1981    orif l ankle  . Nasal septum surgery  1992  . Lipoma excision  2004    rt neck  . Ankle hardware removal  2006    left   . Uvulopalatopharyngoplasty  2008  . Knee arthroscopy  09/20/2011    Procedure: ARTHROSCOPY KNEE;  Surgeon: Loanne Drilling, MD;  Location: [facility];  Service: Orthopedics;  Laterality: Right;  Medial meniscal DEBRIDEMENT with chondroplasty, right knee    Prior to Admission medications   Medication Sig Start Date End Date Taking? Authorizing Provider  Dexlansoprazole (DEXILANT PO) Take 1 tablet by mouth as needed.     Historical Provider, MD  eszopiclone (LUNESTA) 1 MG TABS Take 2 mg by mouth at bedtime. Take immediately before bedtime    Historical Provider, MD  fexofenadine (ALLEGRA) 180 MG tablet Take 180 mg by mouth as needed.    Historical Provider, MD  fluticasone (FLONASE) 50 MCG/ACT nasal spray Place 2 sprays into the nose as needed.    Historical Provider, MD  naproxen (NAPROSYN) 500 MG tablet Take 500 mg by mouth 2 (two) times daily with a meal.    Historical Provider, MD  testosterone  (ANDROGEL) 50 MG/5GM GEL Place 5 g onto the skin daily.    Historical Provider, MD  traMADol (ULTRAM) 50 MG tablet Take 50 mg by mouth every 6 (six) hours as needed for pain.    Historical Provider, MD  triamterene-hydrochlorothiazide (MAXZIDE-25) 37.5-25 MG per tablet Take 1 tablet by mouth daily.    Historical Provider, MD   KNEE EXAM antalgic gait, soft tissue tenderness over medial joint line, effusion, reduced range of motion, negative pivot-shift, collateral ligaments intact  Physical Examination: General appearance - alert, well appearing, and in no distress Mental status - alert, oriented to person, place, and time Chest - clear to auscultation, no wheezes, rales or rhonchi, symmetric air entry Heart - normal rate, regular rhythm, normal S1, S2, no murmurs, rubs, clicks or gallops Abdomen - soft, nontender, nondistended, no masses or organomegaly Neurological - alert, oriented, normal speech, no focal findings or movement disorder noted   Asessment/Plan--- Left knee medial meniscal tear- - Plan left knee arthroscopy with meniscal debridement. Procedure risks and potential comps discussed with patient who elects to proceed. Goals are decreased pain and increased function with a high likelihood of achieving both

## 2013-04-07 ENCOUNTER — Encounter (HOSPITAL_COMMUNITY): Payer: Self-pay

## 2013-04-07 ENCOUNTER — Ambulatory Visit (HOSPITAL_COMMUNITY): Payer: 59 | Admitting: Certified Registered Nurse Anesthetist

## 2013-04-07 ENCOUNTER — Encounter (HOSPITAL_COMMUNITY)
Admission: RE | Disposition: A | Discharge status: Home / Self Care | Payer: Self-pay | Source: Ambulatory Visit | Attending: Orthopedic Surgery

## 2013-04-07 ENCOUNTER — Ambulatory Visit (HOSPITAL_COMMUNITY)
Admission: RE | Admit: 2013-04-07 | Discharge: 2013-04-07 | Disposition: A | Discharge status: Home / Self Care | Payer: 59 | Source: Ambulatory Visit | Attending: Orthopedic Surgery | Admitting: Orthopedic Surgery

## 2013-04-07 ENCOUNTER — Encounter (HOSPITAL_COMMUNITY): Payer: Self-pay | Admitting: Certified Registered Nurse Anesthetist

## 2013-04-07 DIAGNOSIS — M2240 Chondromalacia patellae, unspecified knee: Secondary | ICD-10-CM | POA: Insufficient documentation

## 2013-04-07 DIAGNOSIS — M25469 Effusion, unspecified knee: Secondary | ICD-10-CM | POA: Insufficient documentation

## 2013-04-07 DIAGNOSIS — I10 Essential (primary) hypertension: Secondary | ICD-10-CM | POA: Insufficient documentation

## 2013-04-07 DIAGNOSIS — Z79899 Other long term (current) drug therapy: Secondary | ICD-10-CM | POA: Insufficient documentation

## 2013-04-07 DIAGNOSIS — S83242A Other tear of medial meniscus, current injury, left knee, initial encounter: Secondary | ICD-10-CM

## 2013-04-07 DIAGNOSIS — IMO0002 Reserved for concepts with insufficient information to code with codable children: Secondary | ICD-10-CM | POA: Insufficient documentation

## 2013-04-07 DIAGNOSIS — S83249A Other tear of medial meniscus, current injury, unspecified knee, initial encounter: Secondary | ICD-10-CM

## 2013-04-07 DIAGNOSIS — G4730 Sleep apnea, unspecified: Secondary | ICD-10-CM | POA: Insufficient documentation

## 2013-04-07 DIAGNOSIS — K219 Gastro-esophageal reflux disease without esophagitis: Secondary | ICD-10-CM | POA: Insufficient documentation

## 2013-04-07 DIAGNOSIS — X58XXXA Exposure to other specified factors, initial encounter: Secondary | ICD-10-CM | POA: Insufficient documentation

## 2013-04-07 HISTORY — PX: KNEE ARTHROSCOPY: SHX127

## 2013-04-07 SURGERY — ARTHROSCOPY, KNEE
Anesthesia: General | Site: Knee | Laterality: Left | Wound class: Clean

## 2013-04-07 MED ORDER — PROPOFOL 10 MG/ML IV BOLUS
INTRAVENOUS | Status: DC | PRN
  Administered 2013-04-07: 300 mg via INTRAVENOUS

## 2013-04-07 MED ORDER — METHOCARBAMOL 500 MG PO TABS: 500.0000 mg | ORAL_TABLET | Freq: Four times a day (QID) | ORAL | Status: DC

## 2013-04-07 MED ORDER — PROMETHAZINE HCL 25 MG/ML IJ SOLN: 6.2500 mg | INTRAMUSCULAR | Status: DC | PRN

## 2013-04-07 MED ORDER — METHOCARBAMOL 500 MG PO TABS
500.0000 mg | ORAL_TABLET | Freq: Four times a day (QID) | ORAL | Status: DC
  Administered 2013-04-07: 500 mg via ORAL
  Filled 2013-04-07: qty 1

## 2013-04-07 MED ORDER — LACTATED RINGERS IV SOLN
INTRAVENOUS | Status: DC | PRN
  Administered 2013-04-07: 13:00:00 via INTRAVENOUS

## 2013-04-07 MED ORDER — HYDROCODONE-ACETAMINOPHEN 5-325 MG PO TABS
1.0000 | ORAL_TABLET | Freq: Four times a day (QID) | ORAL | Status: DC | PRN
  Administered 2013-04-07: 2 via ORAL
  Filled 2013-04-07: qty 2

## 2013-04-07 MED ORDER — HYDROCODONE-ACETAMINOPHEN 5-325 MG PO TABS: 1.0000 | ORAL_TABLET | Freq: Four times a day (QID) | ORAL | Status: DC | PRN

## 2013-04-07 MED ORDER — ONDANSETRON HCL 4 MG/2ML IJ SOLN
INTRAMUSCULAR | Status: DC | PRN
  Administered 2013-04-07: 4 mg via INTRAVENOUS

## 2013-04-07 MED ORDER — CHLORHEXIDINE GLUCONATE 4 % EX LIQD
60.0000 mL | Freq: Once | CUTANEOUS | Status: DC
  Filled 2013-04-07: qty 60

## 2013-04-07 MED ORDER — BUPIVACAINE-EPINEPHRINE PF 0.25-1:200000 % IJ SOLN
INTRAMUSCULAR | Status: AC
  Filled 2013-04-07: qty 30

## 2013-04-07 MED ORDER — CEFAZOLIN SODIUM-DEXTROSE 2-3 GM-% IV SOLR
INTRAVENOUS | Status: AC
  Filled 2013-04-07: qty 50

## 2013-04-07 MED ORDER — DEXTROSE 5 % IV SOLN
3.0000 g | INTRAVENOUS | Status: AC
  Administered 2013-04-07: 3 g via INTRAVENOUS
  Filled 2013-04-07: qty 3000

## 2013-04-07 MED ORDER — HYDROMORPHONE HCL PF 1 MG/ML IJ SOLN
0.2500 mg | INTRAMUSCULAR | Status: DC | PRN
  Administered 2013-04-07 (×4): 0.5 mg via INTRAVENOUS

## 2013-04-07 MED ORDER — BUPIVACAINE-EPINEPHRINE 0.25% -1:200000 IJ SOLN
INTRAMUSCULAR | Status: DC | PRN
  Administered 2013-04-07: 20 mL

## 2013-04-07 MED ORDER — MEPERIDINE HCL 50 MG/ML IJ SOLN: 6.2500 mg | INTRAMUSCULAR | Status: DC | PRN

## 2013-04-07 MED ORDER — STERILE WATER FOR IRRIGATION IR SOLN
Status: DC | PRN
  Administered 2013-04-07: 500 mL

## 2013-04-07 MED ORDER — HYDROMORPHONE HCL PF 1 MG/ML IJ SOLN
INTRAMUSCULAR | Status: AC
  Filled 2013-04-07: qty 1

## 2013-04-07 MED ORDER — SODIUM CHLORIDE 0.9 % IV SOLN: INTRAVENOUS | Status: DC

## 2013-04-07 MED ORDER — LIDOCAINE HCL (CARDIAC) 20 MG/ML IV SOLN
INTRAVENOUS | Status: DC | PRN
  Administered 2013-04-07: 100 mg via INTRAVENOUS

## 2013-04-07 MED ORDER — LACTATED RINGERS IR SOLN
Status: DC | PRN
  Administered 2013-04-07 (×2): 3000 mL

## 2013-04-07 MED ORDER — FENTANYL CITRATE 0.05 MG/ML IJ SOLN
INTRAMUSCULAR | Status: DC | PRN
  Administered 2013-04-07: 50 ug via INTRAVENOUS
  Administered 2013-04-07: 25 ug via INTRAVENOUS
  Administered 2013-04-07 (×2): 50 ug via INTRAVENOUS
  Administered 2013-04-07: 25 ug via INTRAVENOUS

## 2013-04-07 MED ORDER — ACETAMINOPHEN 10 MG/ML IV SOLN
1000.0000 mg | Freq: Once | INTRAVENOUS | Status: AC
  Administered 2013-04-07: 1000 mg via INTRAVENOUS
  Filled 2013-04-07: qty 100

## 2013-04-07 MED ORDER — CEFAZOLIN SODIUM 1-5 GM-% IV SOLN
INTRAVENOUS | Status: AC
  Filled 2013-04-07: qty 50

## 2013-04-07 MED ORDER — DEXAMETHASONE SODIUM PHOSPHATE 10 MG/ML IJ SOLN: 10.0000 mg | Freq: Once | INTRAMUSCULAR | Status: DC

## 2013-04-07 MED ORDER — MIDAZOLAM HCL 5 MG/5ML IJ SOLN
INTRAMUSCULAR | Status: DC | PRN
  Administered 2013-04-07: 2 mg via INTRAVENOUS

## 2013-04-07 SURGICAL SUPPLY — 24 items
BANDAGE ELASTIC 6 VELCRO ST LF (GAUZE/BANDAGES/DRESSINGS) ×2 IMPLANT
BLADE 4.2CUDA (BLADE) ×2 IMPLANT
CLOTH BEACON ORANGE TIMEOUT ST (SAFETY) ×2 IMPLANT
CUFF TOURN SGL QUICK 34 (TOURNIQUET CUFF) ×1
CUFF TRNQT CYL 34X4X40X1 (TOURNIQUET CUFF) ×1 IMPLANT
DRAPE U-SHAPE 47X51 STRL (DRAPES) ×2 IMPLANT
DRSG EMULSION OIL 3X3 NADH (GAUZE/BANDAGES/DRESSINGS) ×2 IMPLANT
DURAPREP 26ML APPLICATOR (WOUND CARE) ×2 IMPLANT
GLOVE BIO SURGEON STRL SZ8 (GLOVE) ×2 IMPLANT
GLOVE BIOGEL PI IND STRL 8 (GLOVE) ×2 IMPLANT
GLOVE BIOGEL PI INDICATOR 8 (GLOVE) ×2
GOWN PREVENTION PLUS LG XLONG (DISPOSABLE) ×2 IMPLANT
MANIFOLD NEPTUNE II (INSTRUMENTS) ×4 IMPLANT
PACK ARTHROSCOPY WL (CUSTOM PROCEDURE TRAY) ×2 IMPLANT
PACK ICE MAXI GEL EZY WRAP (MISCELLANEOUS) ×6 IMPLANT
PADDING CAST COTTON 6X4 STRL (CAST SUPPLIES) ×2 IMPLANT
POSITIONER SURGICAL ARM (MISCELLANEOUS) ×2 IMPLANT
SET ARTHROSCOPY TUBING (MISCELLANEOUS) ×1
SET ARTHROSCOPY TUBING LN (MISCELLANEOUS) ×1 IMPLANT
SPONGE GAUZE 4X4 12PLY (GAUZE/BANDAGES/DRESSINGS) ×2 IMPLANT
SUT ETHILON 4 0 PS 2 18 (SUTURE) ×2 IMPLANT
TOWEL OR 17X26 10 PK STRL BLUE (TOWEL DISPOSABLE) ×2 IMPLANT
WAND 90 DEG TURBOVAC W/CORD (SURGICAL WAND) ×2 IMPLANT
WRAP KNEE MAXI GEL POST OP (GAUZE/BANDAGES/DRESSINGS) ×4 IMPLANT

## 2013-04-07 NOTE — Preoperative (Signed)
Beta Blockers   Reason not to administer Beta Blockers:Not Applicable 

## 2013-04-07 NOTE — Transfer of Care (Signed)
Immediate Anesthesia Transfer of Care Note  Patient: Joshua Stanton  Procedure(s) Performed: Procedure(s) (LRB): LEFT KNEE ARTHROSCOPY WITH MEDIAL MENISCUS  DEBRIDEMENT  (Left)  Patient Location: PACU  Anesthesia Type: General  Level of Consciousness: sedated, patient cooperative and responds to stimulation  Airway & Oxygen Therapy: Patient Spontanous Breathing and Patient connected to face mask oxgen  Post-op Assessment: Report given to PACU RN and Post -op Vital signs reviewed and stable  Post vital signs: Reviewed and stable  Complications: No apparent anesthesia complications

## 2013-04-07 NOTE — Op Note (Signed)
Preoperative diagnosis-  Left knee medial meniscal tear  Postoperative diagnosis Left- knee medial meniscal tear   Procedure- Left knee arthroscopy with medial meniscal debridement     Surgeon- Gus Rankin. Lizabeth Fellner, MD  Anesthesia-General  EBL-  minimal Complications- None  Condition- PACU - hemodynamically stable.  Brief clinical note- -Joshua Stanton is a 52 y.o.  male with a several week history of left knee pain and mechanical symptoms. Exam and history suggested medial meniscal tear confirmed by MRI. The patient presents now for arthroscopy and debridement   Procedure in detail -       After successful administration of General anesthetic, a tourmiquet is placed high on the Left  thigh and the Left lower extremity is prepped and draped in the usual sterile fashion. Time out is performed by the surgical team. Standard superomedial and inferolateral portal sites are marked and incisions made with an 11 blade. The inflow cannula is passed through the superomedial portal and camera through the inferolateral portal and inflow is initiated. Arthroscopic visualization proceeds.      The undersurface of the patella and trochlea are visualized and there is mild patellar chondromalacia without any unstable cartilage lesions.. The medial and lateral gutters are visualized and there are  no loose bodies. Flexion and valgus force is applied to the knee and the medial compartment is entered. A spinal needle is passed into the joint through the site marked for the inferomedial portal. A small incision is made and the dilator passed into the joint. The findings for the medial compartment are unstable tear body and posterior horn medial meniscus with flipped fragment . The tear is debrided to a stable base with baskets and a shaver and sealed off with the Arthrocare. The chondral surfaces are normal.    The intercondylar notch is visualized and the ACL appears normal. The lateral compartment is entered  and the findings are normal .      The joint is again inspected and there are no other tears, defects or loose bodies identified. The arthroscopic equipment is then removed from the inferior portals which are closed with interrupted 4-0 nylon. 20 ml of .25% Marcaine with epinephrine are injected through the inflow cannula and the cannula is then removed and the portal closed with nylon. The incisions are cleaned and dried and a bulky sterile dressing is applied. The patient is then awakened and transported to recovery in stable condition.   04/07/2013, 3:12 PM

## 2013-04-07 NOTE — Anesthesia Preprocedure Evaluation (Signed)
Anesthesia Evaluation  Patient identified by MRN, date of birth, ID band Patient awake    Reviewed: Allergy & Precautions, H&P , NPO status , Patient's Chart, lab work & pertinent test results, reviewed documented beta blocker date and time   Airway Mallampati: III TM Distance: >3 FB Neck ROM: full    Dental no notable dental hx. (+) Teeth Intact and Dental Advisory Given   Pulmonary neg pulmonary ROS, sleep apnea and Continuous Positive Airway Pressure Ventilation ,  breath sounds clear to auscultation  Pulmonary exam normal       Cardiovascular Exercise Tolerance: Good hypertension, Pt. on medications negative cardio ROS  Rhythm:regular Rate:Normal     Neuro/Psych negative neurological ROS  negative psych ROS   GI/Hepatic negative GI ROS, Neg liver ROS, GERD-  Controlled,  Endo/Other  negative endocrine ROS  Renal/GU negative Renal ROS     Musculoskeletal   Abdominal   Peds  Hematology negative hematology ROS (+)   Anesthesia Other Findings   Reproductive/Obstetrics                           Anesthesia Physical  Anesthesia Plan  ASA: III  Anesthesia Plan: General   Post-op Pain Management:    Induction:   Airway Management Planned: LMA  Additional Equipment:   Intra-op Plan:   Post-operative Plan: Extubation in OR  Informed Consent: I have reviewed the patients History and Physical, chart, labs and discussed the procedure including the risks, benefits and alternatives for the proposed anesthesia with the patient or authorized representative who has indicated his/her understanding and acceptance.   Dental advisory given  Plan Discussed with: CRNA  Anesthesia Plan Comments:         Anesthesia Quick Evaluation

## 2013-04-07 NOTE — Interval H&P Note (Signed)
History and Physical Interval Note:  04/07/2013 2:13 PM  Joshua Stanton  has presented today for surgery, with the diagnosis of LEFT KNEE MEDIAL MENISCUS TEAR   The various methods of treatment have been discussed with the patient and family. After consideration of risks, benefits and other options for treatment, the patient has consented to  Procedure(s): LEFT KNEE ARTHROSCOPY WITH DEBRIDEMENT  (Left) as a surgical intervention .  The patient's history has been reviewed, patient examined, no change in status, stable for surgery.  I have reviewed the patient's chart and labs.  Questions were answered to the patient's satisfaction.     Loanne Drilling

## 2013-04-07 NOTE — Anesthesia Postprocedure Evaluation (Signed)
Anesthesia Post Note  Patient: Joshua Stanton  Procedure(s) Performed: Procedure(s) (LRB): LEFT KNEE ARTHROSCOPY WITH MEDIAL MENISCUS  DEBRIDEMENT  (Left)  Anesthesia type: General  Patient location: PACU  Post pain: Pain level controlled  Post assessment: Post-op Vital signs reviewed  Last Vitals:  Filed Vitals:   04/07/13 1614  BP: 145/72  Pulse: 80  Temp: 36.7 C  Resp: 16    Post vital signs: Reviewed  Level of consciousness: sedated  Complications: No apparent anesthesia complications

## 2013-04-08 ENCOUNTER — Encounter (HOSPITAL_COMMUNITY): Payer: Self-pay | Admitting: Orthopedic Surgery

## 2013-12-15 ENCOUNTER — Other Ambulatory Visit: Payer: Self-pay | Admitting: Family Medicine

## 2013-12-15 DIAGNOSIS — R110 Nausea: Secondary | ICD-10-CM

## 2013-12-15 DIAGNOSIS — R109 Unspecified abdominal pain: Secondary | ICD-10-CM

## 2013-12-16 ENCOUNTER — Ambulatory Visit
Admission: RE | Admit: 2013-12-16 | Discharge: 2013-12-16 | Disposition: A | Discharge status: Home / Self Care | Payer: 59 | Source: Ambulatory Visit | Attending: Family Medicine | Admitting: Family Medicine

## 2013-12-16 DIAGNOSIS — R109 Unspecified abdominal pain: Secondary | ICD-10-CM

## 2013-12-16 DIAGNOSIS — R110 Nausea: Secondary | ICD-10-CM

## 2013-12-16 MED ORDER — IOHEXOL 300 MG/ML  SOLN
125.0000 mL | Freq: Once | INTRAMUSCULAR | Status: AC | PRN
  Administered 2013-12-16: 125 mL via INTRAVENOUS

## 2014-09-09 ENCOUNTER — Other Ambulatory Visit: Payer: Self-pay | Admitting: Gastroenterology

## 2015-09-13 HISTORY — PX: TRANSTHORACIC ECHOCARDIOGRAM: SHX275

## 2015-09-20 ENCOUNTER — Observation Stay (HOSPITAL_COMMUNITY)
Admission: EM | Admit: 2015-09-20 | Discharge: 2015-09-22 | Disposition: A | Discharge status: Home / Self Care | Payer: 59 | Attending: Internal Medicine | Admitting: Internal Medicine

## 2015-09-20 ENCOUNTER — Emergency Department (HOSPITAL_COMMUNITY): Payer: 59

## 2015-09-20 ENCOUNTER — Encounter (HOSPITAL_COMMUNITY): Payer: Self-pay

## 2015-09-20 DIAGNOSIS — Z791 Long term (current) use of non-steroidal anti-inflammatories (NSAID): Secondary | ICD-10-CM | POA: Diagnosis not present

## 2015-09-20 DIAGNOSIS — G4733 Obstructive sleep apnea (adult) (pediatric): Secondary | ICD-10-CM | POA: Diagnosis present

## 2015-09-20 DIAGNOSIS — K219 Gastro-esophageal reflux disease without esophagitis: Secondary | ICD-10-CM | POA: Diagnosis not present

## 2015-09-20 DIAGNOSIS — R9431 Abnormal electrocardiogram [ECG] [EKG]: Secondary | ICD-10-CM | POA: Diagnosis present

## 2015-09-20 DIAGNOSIS — Z79890 Hormone replacement therapy (postmenopausal): Secondary | ICD-10-CM | POA: Diagnosis not present

## 2015-09-20 DIAGNOSIS — Z79899 Other long term (current) drug therapy: Secondary | ICD-10-CM | POA: Insufficient documentation

## 2015-09-20 DIAGNOSIS — I208 Other forms of angina pectoris: Secondary | ICD-10-CM | POA: Diagnosis not present

## 2015-09-20 DIAGNOSIS — R079 Chest pain, unspecified: Secondary | ICD-10-CM

## 2015-09-20 DIAGNOSIS — R7303 Prediabetes: Secondary | ICD-10-CM | POA: Diagnosis not present

## 2015-09-20 DIAGNOSIS — I10 Essential (primary) hypertension: Secondary | ICD-10-CM | POA: Diagnosis not present

## 2015-09-20 DIAGNOSIS — I209 Angina pectoris, unspecified: Secondary | ICD-10-CM

## 2015-09-20 DIAGNOSIS — R7989 Other specified abnormal findings of blood chemistry: Secondary | ICD-10-CM

## 2015-09-20 DIAGNOSIS — R748 Abnormal levels of other serum enzymes: Secondary | ICD-10-CM | POA: Insufficient documentation

## 2015-09-20 LAB — BASIC METABOLIC PANEL
Anion gap: 11 (ref 5–15)
BUN: 13 mg/dL (ref 6–20)
CALCIUM: 9.4 mg/dL (ref 8.9–10.3)
CO2: 29 mmol/L (ref 22–32)
CREATININE: 1.13 mg/dL (ref 0.61–1.24)
Chloride: 101 mmol/L (ref 101–111)
GFR calc Af Amer: >60 mL/min (ref >60)
GLUCOSE: 90 mg/dL (ref 65–99)
Potassium: 3.1 mmol/L — ABNORMAL LOW (ref 3.5–5.1)
Sodium: 141 mmol/L (ref 135–145)

## 2015-09-20 LAB — D-DIMER, QUANTITATIVE: D-Dimer, Quant: 0.29 ug/mL-FEU (ref 0.00–0.50)

## 2015-09-20 LAB — TROPONIN I: Troponin I: 0.08 ng/mL — ABNORMAL HIGH (ref <0.031)

## 2015-09-20 LAB — I-STAT TROPONIN, ED: Troponin i, poc: 0.06 ng/mL (ref 0.00–0.08)

## 2015-09-20 LAB — CBC
HEMATOCRIT: 42.3 % (ref 39.0–52.0)
Hemoglobin: 14.3 g/dL (ref 13.0–17.0)
MCH: 29.5 pg (ref 26.0–34.0)
MCHC: 33.8 g/dL (ref 30.0–36.0)
MCV: 87.2 fL (ref 78.0–100.0)
PLATELETS: 256 10*3/uL (ref 150–400)
RBC: 4.85 MIL/uL (ref 4.22–5.81)
RDW: 13.6 % (ref 11.5–15.5)
WBC: 8.4 10*3/uL (ref 4.0–10.5)

## 2015-09-20 MED ORDER — ACETAMINOPHEN 325 MG PO TABS
650.0000 mg | ORAL_TABLET | ORAL | Status: DC | PRN
  Administered 2015-09-21 – 2015-09-22 (×4): 650 mg via ORAL
  Filled 2015-09-20 (×4): qty 2

## 2015-09-20 MED ORDER — ONDANSETRON HCL 4 MG/2ML IJ SOLN: 4.0000 mg | Freq: Four times a day (QID) | INTRAMUSCULAR | Status: DC | PRN

## 2015-09-20 MED ORDER — ASPIRIN EC 325 MG PO TBEC
325.0000 mg | DELAYED_RELEASE_TABLET | Freq: Every day | ORAL | Status: DC
  Administered 2015-09-21 – 2015-09-22 (×2): 325 mg via ORAL
  Filled 2015-09-20 (×2): qty 1

## 2015-09-20 MED ORDER — ZOLPIDEM TARTRATE 5 MG PO TABS
5.0000 mg | ORAL_TABLET | Freq: Every evening | ORAL | Status: DC | PRN
  Administered 2015-09-21 (×2): 5 mg via ORAL
  Filled 2015-09-20 (×2): qty 1

## 2015-09-20 MED ORDER — POTASSIUM CHLORIDE CRYS ER 20 MEQ PO TBCR
40.0000 meq | EXTENDED_RELEASE_TABLET | Freq: Once | ORAL | Status: AC
  Administered 2015-09-20: 40 meq via ORAL
  Filled 2015-09-20: qty 2

## 2015-09-20 MED ORDER — HEPARIN SODIUM (PORCINE) 5000 UNIT/ML IJ SOLN
5000.0000 [IU] | Freq: Three times a day (TID) | INTRAMUSCULAR | Status: DC
  Administered 2015-09-20 – 2015-09-22 (×5): 5000 [IU] via SUBCUTANEOUS
  Filled 2015-09-20 (×5): qty 1

## 2015-09-20 NOTE — ED Provider Notes (Signed)
CSN: [id number]     Arrival date & time 09/20/15  1825 History   First MD Initiated Contact with Patient 09/20/15 1825     Chief Complaint  Patient presents with  . Chest Pain     (Consider location/radiation/quality/duration/timing/severity/associated sxs/prior Treatment) HPI  55 year old male with a history of hypertension, sleep apnea, and prediabetes presents with chest pain that started around 9 AM. Patient states that after he got to work he noticed it. It is hard for the patient described occasionally was sharp. It seemed to wax and wane all day. He went to his PCPs office or he was given 4 baby aspirin and one nitroglycerin. After that his pain has resolved and stayed away. No associate shorts of breath, back pain, nausea, vomiting, or diaphoresis. No pain similar to this. Patient states he worked out this morning but did not think he injured his chest. Sent from PCP due to new EKG changes per his PCP's office. No known CAD.  Past Medical History  Diagnosis Date  . Sleep apnea     uses cpap  . Hypertension   . GERD (gastroesophageal reflux disease)   . Seasonal allergies   . Arthritis    Past Surgical History  Procedure Laterality Date  . Tonsillectomy    . Fracture surgery  1981    orif l ankle  . Nasal septum surgery  1992  . Lipoma excision  2004    rt neck  . Ankle hardware removal  2006    left   . Uvulopalatopharyngoplasty  2008  . Knee arthroscopy  09/20/2011    Procedure: ARTHROSCOPY KNEE;  Surgeon: Gearlean Alf, MD;  Location: [facility];  Service: Orthopedics;  Laterality: Right;  Medial meniscal DEBRIDEMENT with chondroplasty, right knee  . Knee arthroscopy Left 04/07/2013    Procedure: LEFT KNEE ARTHROSCOPY WITH MEDIAL MENISCUS  DEBRIDEMENT ;  Surgeon: Gearlean Alf, MD;  Location: [facility];  Service: Orthopedics;  Laterality: Left;   No family history on file. Social History  Substance Use Topics  . Smoking status: Never Smoker   .  Smokeless tobacco: Former Systems developer    Quit date: 09/16/2001  . Alcohol Use: Yes     Comment: socially    Review of Systems  Constitutional: Negative for fever.  Respiratory: Negative for shortness of breath.   Cardiovascular: Positive for chest pain. Negative for leg swelling.  Gastrointestinal: Negative for nausea and abdominal pain.  Musculoskeletal: Negative for back pain.  All other systems reviewed and are negative.     Allergies  Oxycodone-acetaminophen  Home Medications   Prior to Admission medications   Medication Sig Start Date End Date Taking? Authorizing Provider  HYDROcodone-acetaminophen (NORCO) 5-325 MG per tablet Take 1-2 tablets by mouth every 6 (six) hours as needed for pain. 04/07/13   Gaynelle Arabian, MD  methocarbamol (ROBAXIN) 500 MG tablet Take 1 tablet (500 mg total) by mouth 4 (four) times daily. As needed for muscle spasm 04/07/13   Gaynelle Arabian, MD  naproxen sodium (ANAPROX) 550 MG tablet Take 550 mg by mouth 2 (two) times daily with a meal.    Historical Provider, MD  ranitidine (ZANTAC) 150 MG tablet Take 150 mg by mouth 2 (two) times daily.    Historical Provider, MD  testosterone (ANDROGEL) 50 MG/5GM GEL Place 5 g onto the skin daily.    Historical Provider, MD  traMADol-acetaminophen (ULTRACET) 37.5-325 MG per tablet Take 1 tablet by mouth every 6 (six) hours as needed.  02/25/13   Historical Provider, MD  triamterene-hydrochlorothiazide (MAXZIDE-25) 37.5-25 MG per tablet Take 1 tablet by mouth daily.    Historical Provider, MD  zolpidem (AMBIEN) 10 MG tablet Take 10 mg by mouth at bedtime as needed for sleep.    Historical Provider, MD   BP 152/92 mmHg  Pulse 68  Temp(Src) 98.1 F (36.7 C) (Oral)  Resp 18  SpO2 98% Physical Exam  Constitutional: He is oriented to person, place, and time. He appears well-developed and well-nourished.  HENT:  Head: Normocephalic and atraumatic.  Right Ear: External ear normal.  Left Ear: External ear normal.  Nose:  Nose normal.  Eyes: Right eye exhibits no discharge. Left eye exhibits no discharge.  Neck: Neck supple.  Cardiovascular: Normal rate, regular rhythm, normal heart sounds and intact distal pulses.   Pulmonary/Chest: Effort normal and breath sounds normal. He exhibits no tenderness.  Abdominal: Soft. He exhibits no distension. There is no tenderness.  Musculoskeletal: He exhibits no edema.  Neurological: He is alert and oriented to person, place, and time.  Skin: Skin is warm and dry. He is not diaphoretic.  Nursing note and vitals reviewed.   ED Course  Procedures (including critical care time) Labs Review Labs Reviewed  BASIC METABOLIC PANEL - Abnormal; Notable for the following:    Potassium 3.1 (*)    All other components within normal limits   All other components within normal limits  MRSA PCR SCREENING  CBC  D-DIMER, QUANTITATIVE (NOT AT [facility])  TROPONIN I  TROPONIN I  I-STAT TROPOININ, ED    Imaging Review Dg Chest 2 View  09/20/2015  CLINICAL DATA:  Chest pain since 0900 today EXAM: CHEST  2 VIEW COMPARISON:  04/01/2013 and multiple prior studies FINDINGS: Heart size upper normal. Vascular pattern normal. Lungs clear. No pleural effusion. IMPRESSION: No active cardiopulmonary disease. Electronically Signed   By: Skipper Cliche M.D.   On: 09/20/2015 19:45   I have personally reviewed and evaluated these images and lab results as part of my medical decision-making.   EKG Interpretation   Date/Time:  Wednesday September 20 2015 18:46:45 EST Ventricular Rate:  67 PR Interval:  167 QRS Duration: 116 QT Interval:  432 QTC Calculation: 456 R Axis:   14 Text Interpretation:  Sinus rhythm Nonspecific intraventricular conduction  delay Repol abnrm, severe global ischemia (LM/MVD) ST depressions V4-6 are  new compared to 2014 Confirmed by Saylah Ketner  MD, [place] ([id number]) on 09/20/2015  6:50:52 PM      MDM   Final diagnoses:  None  Chest Pain EKG changes  Patient presents  with atypical chest pain but new EKG changes since 2014. Patient's EKG likely represents LVH. His EKG has not changed from the doctor's office when he was having acute pain to now when he is pain-free. He has remained pain-free in the ED. Low risk for pulmonary embolism and his d-dimer is negative, no further workup indicated. Given his ST depressions, his HEART score is a 5. Already given aspirin and nitroglycerin by PCP. Discussed with cardiology, Dr. Radford Pax, who recommends hospitalist admission and they will consult in the morning.    Sherwood Gambler, MD 09/21/15 0010

## 2015-09-20 NOTE — H&P (Signed)
[name] History and Physical  Joshua Stanton [id number] DOB: 18-Jun-1961 DOA: 09/20/2015  Referring physician:  PCP: [name], MD   Chief Complaint: "I kept having this pain in my chest."  HPI: Joshua Stanton is a 55 y.o. male   Pt states he's emngaged in pghsyciocal acttvity this AM. Did well. On and off CP from 9am-2pm and then dedcided to get checked out. Denies radiuation of pain, N/V & diaphoresis.  Pt went to his PCP's office for his CP. Had EKG done at the office. They gave him asa and ntg in office and brought by EMS to the ED due to EKG changes.  No hx of MI, VTE. No trauma. No recent URI in last 30d.  No fmhx of MI.  MEDS - no recent change   Review of Systems:  Constitutional: No  Fevers, chills, fatigue.  HEENT:  No headaches, Difficulty swallowing Cardio-vascular: See HPI GI:  No abdominal pain, nausea, vomiting, diarrhea, change in bowel habits, loss of appetite  Resp:  No shortness of breath with exertion or at rest. No excess mucus, no productive cough, No non-productive cough, No coughing up of blood.No change in color of mucus.No wheezing. Skin:  no rash or lesions.  GU:  no dysuria, change in color of urine, no urgency or frequency Musculoskeletal:   No joint pain or swelling. No decreased range of motion. Psych:  No change in mood or affect. No depression or anxiety. No memory loss.  Neuro:  No change in sensation, unilateral strength, or cognitive abilities  All other systems were reviewed and are negative.  Past Medical History  Diagnosis Date  . Sleep apnea     uses cpap  . Hypertension   . GERD (gastroesophageal reflux disease)   . Seasonal allergies   . Arthritis    Past Surgical History  Procedure Laterality Date  . Tonsillectomy    . Fracture surgery  1981    orif l ankle  . Nasal septum surgery  1992  . Lipoma excision  2004    rt neck  . Ankle hardware removal  2006    left   .  Uvulopalatopharyngoplasty  2008  . Knee arthroscopy  09/20/2011    Procedure: ARTHROSCOPY KNEE;  Surgeon: Gearlean Alf, MD;  Location: [facility];  Service: Orthopedics;  Laterality: Right;  Medial meniscal DEBRIDEMENT with chondroplasty, right knee  . Knee arthroscopy Left 04/07/2013    Procedure: LEFT KNEE ARTHROSCOPY WITH MEDIAL MENISCUS  DEBRIDEMENT ;  Surgeon: Gearlean Alf, MD;  Location: [facility];  Service: Orthopedics;  Laterality: Left;   Social History:  reports that he has never smoked. He quit smokeless tobacco use about 14 years ago. He reports that he drinks alcohol. He reports that he uses illicit drugs (Marijuana).  Allergies  Allergen Reactions  . Oxycodone-Acetaminophen Other (See Comments)    Nausea    No family history on file.   Prior to Admission medications   Medication Sig Start Date End Date Taking? Authorizing Provider  naproxen sodium (ANAPROX) 550 MG tablet Take 550 mg by mouth daily as needed for mild pain.    Yes Historical Provider, MD  testosterone (ANDROGEL) 50 MG/5GM GEL Place 5 g onto the skin daily.   Yes Historical Provider, MD  triamterene-hydrochlorothiazide (MAXZIDE-25) 37.5-25 MG per tablet Take 1 tablet by mouth daily.   Yes Historical Provider, MD  zolpidem (AMBIEN) 10 MG tablet Take 10 mg by mouth at bedtime as needed for  sleep.   Yes Historical Provider, MD  HYDROcodone-acetaminophen (NORCO) 5-325 MG per tablet Take 1-2 tablets by mouth every 6 (six) hours as needed for pain. Patient not taking: Reported on 09/20/2015 04/07/13   Gaynelle Arabian, MD  methocarbamol (ROBAXIN) 500 MG tablet Take 1 tablet (500 mg total) by mouth 4 (four) times daily. As needed for muscle spasm Patient not taking: Reported on 09/20/2015 04/07/13   Gaynelle Arabian, MD  ranitidine (ZANTAC) 150 MG tablet Take 150 mg by mouth 2 (two) times daily. Reported on 09/20/2015    Historical Provider, MD  traMADol-acetaminophen (ULTRACET) 37.5-325 MG per tablet Take 1 tablet  by mouth every 6 (six) hours as needed. Reported on 09/20/2015 02/25/13   Historical Provider, MD   Meds - triemteren, testin, zolpidem  Physical Exam: Filed Vitals:   09/20/15 1900 09/20/15 1915 09/20/15 1930  BP: 138/89 119/75 144/75  Pulse: 63 60 62  Resp: 19 13 14   SpO2: 100% 98% 98%    Wt Readings from Last 3 Encounters:  04/01/13 125.646 kg (277 lb)  09/17/11 120.203 kg (265 lb)    General: Appears calm and comfortable Eyes:  PERRL, EOMI, normal lids, iris ENT: grossly normal hearing, lips & tongue; uvula surgically excised Neck:  no LAD, masses or thyromegaly Cardiovascular: RRR, no m/r/g. No LE edema.  Respiratory: CTA bilaterally, no w/r/r. Normal respiratory effort. Abdomen: soft, ntnd Skin: no rash or induration seen on limited exam Musculoskeletal: grossly normal tone BUE/BLE Psychiatric: grossly normal mood and affect, speech fluent and appropriate Neurologic: CN 2-12 grossly intact, moves all extremities in coordinated fashion.          Labs on Admission:  Basic Metabolic Panel:  Recent Labs Lab 09/20/15 1932  NA 141  K 3.1*  CL 101  CO2 29  GLUCOSE 90  BUN 13  CREATININE 1.13  CALCIUM 9.4   Liver Function Tests: No results for input(s): AST, ALT, ALKPHOS, BILITOT, PROT, ALBUMIN in the last 168 hours. No results for input(s): LIPASE, AMYLASE in the last 168 hours. No results for input(s): AMMONIA in the last 168 hours. CBC:  Recent Labs Lab 09/20/15 1932  WBC 8.4  HGB 14.3  HCT 42.3  MCV 87.2  PLT 256   Cardiac Enzymes: No results for input(s): CKTOTAL, CKMB, CKMBINDEX, TROPONINI in the last 168 hours.  BNP (last 3 results) No results for input(s): BNP in the last 8760 hours.  ProBNP (last 3 results) No results for input(s): PROBNP in the last 8760 hours.   Creatinine clearance cannot be calculated (Unknown ideal weight.)  CBG: No results for input(s): GLUCAP in the last 168 hours.  Radiological Exams on Admission: Dg Chest 2  View  09/20/2015  CLINICAL DATA:  Chest pain since 0900 today EXAM: CHEST  2 VIEW COMPARISON:  04/01/2013 and multiple prior studies FINDINGS: Heart size upper normal. Vascular pattern normal. Lungs clear. No pleural effusion. IMPRESSION: No active cardiopulmonary disease. Electronically Signed   By: Skipper Cliche M.D.   On: 09/20/2015 19:45    EKG: Independently reviewed. No stemi. Flipped t waves in V4-V6. ST depression in V5-V6.  Assessment/Plan Principal Problem:   Chest pain Active Problems:   Atypical chest pain   HTN (hypertension)   Obstructive sleep apnea   1) CP Given hear score of 5 in ED, twave abn - CP zero in Ed after ntg - serial trop ordered, initial neg - prn EKG CP - prn moprhine CP; premedicate with benadryl - prn ntg cp - asa  in ED and QD - ECHO and Cardiology ordered for AM - tele bed, cardiac monitoring - ambien for sleep prn - zofran prn for nausea Update - called about trop on floor and CP. Pt given nitro. 2nd trop ordered stat. Poss start heparin based on next trop.  Sleep apnea - CPAP  HTN - prn hydralazine - holding op triamterene   Low K - replace PO  GERD - pepcid qd  Code Status: Full DVT Prophylaxis: heparin Family Communication: Wife Disposition Plan: Pending Improvement    Elwin Mocha, MD Family Medicine [name] [email or web address] Password [email or web address]

## 2015-09-20 NOTE — ED Notes (Signed)
PER EMS: pt here for intermittent left sided, sharp CP, onset 9am. He went to his PCP and was noted to have some changes to his EKG so his PCP sent him here. Pt was given 1 nitro at doctors office and his CP has been relieved and has not had any more CP since the one nitro. He also received 324 aspirin by EMS. A&Ox4. BP-160/94, HR-74, O2-99%. Posterior EKG done by EMS as well.

## 2015-09-21 ENCOUNTER — Encounter (HOSPITAL_COMMUNITY): Payer: Self-pay | Admitting: Cardiology

## 2015-09-21 DIAGNOSIS — I10 Essential (primary) hypertension: Secondary | ICD-10-CM | POA: Diagnosis not present

## 2015-09-21 DIAGNOSIS — E876 Hypokalemia: Secondary | ICD-10-CM | POA: Diagnosis not present

## 2015-09-21 DIAGNOSIS — K219 Gastro-esophageal reflux disease without esophagitis: Secondary | ICD-10-CM | POA: Diagnosis not present

## 2015-09-21 DIAGNOSIS — R9431 Abnormal electrocardiogram [ECG] [EKG]: Secondary | ICD-10-CM | POA: Diagnosis present

## 2015-09-21 DIAGNOSIS — G4733 Obstructive sleep apnea (adult) (pediatric): Secondary | ICD-10-CM | POA: Diagnosis present

## 2015-09-21 DIAGNOSIS — R7989 Other specified abnormal findings of blood chemistry: Secondary | ICD-10-CM | POA: Diagnosis not present

## 2015-09-21 DIAGNOSIS — I208 Other forms of angina pectoris: Secondary | ICD-10-CM

## 2015-09-21 DIAGNOSIS — R079 Chest pain, unspecified: Secondary | ICD-10-CM | POA: Diagnosis not present

## 2015-09-21 DIAGNOSIS — I209 Angina pectoris, unspecified: Secondary | ICD-10-CM

## 2015-09-21 DIAGNOSIS — R7303 Prediabetes: Secondary | ICD-10-CM | POA: Diagnosis not present

## 2015-09-21 LAB — MRSA PCR SCREENING: MRSA BY PCR: NEGATIVE

## 2015-09-21 LAB — PHOSPHORUS: Phosphorus: 3.4 mg/dL (ref 2.5–4.6)

## 2015-09-21 LAB — LIPID PANEL
CHOL/HDL RATIO: 4.8 ratio
Cholesterol: 184 mg/dL (ref 0–200)
HDL: 38 mg/dL — ABNORMAL LOW (ref >40)
LDL Cholesterol: 125 mg/dL — ABNORMAL HIGH (ref 0–99)
Triglycerides: 106 mg/dL (ref <150)
VLDL: 21 mg/dL (ref 0–40)

## 2015-09-21 LAB — TROPONIN I
TROPONIN I: 0.08 ng/mL — AB (ref <0.031)
TROPONIN I: 0.06 ng/mL — AB (ref <0.031)

## 2015-09-21 LAB — MAGNESIUM: MAGNESIUM: 2.1 mg/dL (ref 1.7–2.4)

## 2015-09-21 MED ORDER — SODIUM CHLORIDE 0.9 % WEIGHT BASED INFUSION: 375.0000 mL/h | INTRAVENOUS | Status: AC

## 2015-09-21 MED ORDER — CARVEDILOL 3.125 MG PO TABS
3.1250 mg | ORAL_TABLET | Freq: Two times a day (BID) | ORAL | Status: DC
Start: 2015-09-21 — End: 2015-09-22
  Administered 2015-09-21 – 2015-09-22 (×3): 3.125 mg via ORAL
  Filled 2015-09-21 (×3): qty 1

## 2015-09-21 MED ORDER — METHOCARBAMOL 500 MG PO TABS
500.0000 mg | ORAL_TABLET | Freq: Three times a day (TID) | ORAL | Status: DC
  Administered 2015-09-21 – 2015-09-22 (×6): 500 mg via ORAL
  Filled 2015-09-21 (×6): qty 1

## 2015-09-21 MED ORDER — HYDRALAZINE HCL 20 MG/ML IJ SOLN: 10.0000 mg | Freq: Three times a day (TID) | INTRAMUSCULAR | Status: DC | PRN

## 2015-09-21 MED ORDER — SODIUM CHLORIDE 0.9% FLUSH
3.0000 mL | Freq: Two times a day (BID) | INTRAVENOUS | Status: DC
  Administered 2015-09-21 – 2015-09-22 (×2): 3 mL via INTRAVENOUS

## 2015-09-21 MED ORDER — LORAZEPAM 0.5 MG PO TABS: 0.5000 mg | ORAL_TABLET | Freq: Once | ORAL | Status: DC

## 2015-09-21 MED ORDER — DIPHENHYDRAMINE HCL 50 MG/ML IJ SOLN
25.0000 mg | Freq: Every day | INTRAMUSCULAR | Status: DC | PRN
  Filled 2015-09-21: qty 1

## 2015-09-21 MED ORDER — ATORVASTATIN CALCIUM 80 MG PO TABS
80.0000 mg | ORAL_TABLET | Freq: Every day | ORAL | Status: DC
  Administered 2015-09-21 – 2015-09-22 (×2): 80 mg via ORAL
  Filled 2015-09-21 (×2): qty 1

## 2015-09-21 MED ORDER — SODIUM CHLORIDE 0.9 % WEIGHT BASED INFUSION: 125.0000 mL/h | INTRAVENOUS | Status: DC

## 2015-09-21 MED ORDER — SODIUM CHLORIDE 0.9 % IV SOLN: 250.0000 mL | INTRAVENOUS | Status: DC | PRN

## 2015-09-21 MED ORDER — POTASSIUM CHLORIDE CRYS ER 20 MEQ PO TBCR
20.0000 meq | EXTENDED_RELEASE_TABLET | Freq: Once | ORAL | Status: AC
  Administered 2015-09-21: 20 meq via ORAL
  Filled 2015-09-21: qty 1

## 2015-09-21 MED ORDER — FAMOTIDINE 20 MG PO TABS
20.0000 mg | ORAL_TABLET | Freq: Every day | ORAL | Status: DC
  Administered 2015-09-21 – 2015-09-22 (×2): 20 mg via ORAL
  Filled 2015-09-21 (×2): qty 1

## 2015-09-21 MED ORDER — ASPIRIN 81 MG PO CHEW
81.0000 mg | CHEWABLE_TABLET | ORAL | Status: AC
  Administered 2015-09-22: 81 mg via ORAL
  Filled 2015-09-21: qty 1

## 2015-09-21 MED ORDER — NITROGLYCERIN 0.4 MG SL SUBL
0.4000 mg | SUBLINGUAL_TABLET | SUBLINGUAL | Status: DC | PRN
  Administered 2015-09-21: 0.4 mg via SUBLINGUAL

## 2015-09-21 MED ORDER — SODIUM CHLORIDE 0.9% FLUSH: 3.0000 mL | INTRAVENOUS | Status: DC | PRN

## 2015-09-21 MED ORDER — MORPHINE SULFATE (PF) 2 MG/ML IV SOLN
2.0000 mg | INTRAVENOUS | Status: DC | PRN
  Administered 2015-09-21 (×2): 2 mg via INTRAVENOUS
  Filled 2015-09-21 (×2): qty 1

## 2015-09-21 NOTE — Consult Note (Signed)
Patient ID: Joshua Stanton MRN: [id number], DOB/AGE: 1961-06-21   Admit date: 09/20/2015   Primary Physician: [name], MD Primary Cardiologist: New "Dr. [name]"  Pt. Profile:  55 year-old African-American male with history of treated hypertension as well as prediabetes and history of GERD, admitted to the hospital for chest pain evaluation.   Problem List  Past Medical History  Diagnosis Date  . Sleep apnea     uses cpap  . Hypertension   . GERD (gastroesophageal reflux disease)   . Seasonal allergies   . Arthritis     Past Surgical History  Procedure Laterality Date  . Tonsillectomy    . Fracture surgery  1981    orif l ankle  . Nasal septum surgery  1992  . Lipoma excision  2004    rt neck  . Ankle hardware removal  2006    left   . Uvulopalatopharyngoplasty  2008  . Knee arthroscopy  09/20/2011    Procedure: ARTHROSCOPY KNEE;  Surgeon: Gearlean Alf, MD;  Location: [facility];  Service: Orthopedics;  Laterality: Right;  Medial meniscal DEBRIDEMENT with chondroplasty, right knee  . Knee arthroscopy Left 04/07/2013    Procedure: LEFT KNEE ARTHROSCOPY WITH MEDIAL MENISCUS  DEBRIDEMENT ;  Surgeon: Gearlean Alf, MD;  Location: [facility];  Service: Orthopedics;  Laterality: Left;     Allergies  Allergies  Allergen Reactions  . Oxycodone-Acetaminophen Other (See Comments)    Nausea    HPI  55 y/o AA male with no prior cardiac history, admitted for Chest pain evaluation. His cardiac risk factors are significant for HTN and prediabetes. Additional PMH includes OSA compliant with CPAP and GERD. He denies any family history of cardiac disease or sudden cardiac death. No history of tobacco use. He is active, engaging a routine physical activity. He participates in a cycling class several days a week and also does light weight training. He has been attempting to manage his prediabetes through diet and exercise. He has been going to the gym  routinely since August 2016 and has not had any issues with exertional chest pain or dyspnea.  He reports that he was at the gym 2 nights ago doing his usual cycle routine and had no difficulties. He also did some light weight lifting after that session. Later that night while at home he felt piercing chest discomfort localized to his left chest. This did not radiate. The piercing/stabbing pain was brief only lasting seconds at a time. He denies any chest pressure or tightness. No associated dyspnea. Symptoms were not worsened by exertion, movement or palpation. This continued off and on the following day, prompting him to report to his PCP office for evaluation. He reports that his PCP obtained an EKG and was concerned by its appearance. While at his PCP office he was given sublingual nitroglycerin and aspirin and transferred to [facility]. He reports that his discomfort resolved after taking the aspirin and nitroglycerin. He denies any recurrent pain since that time. He was admitted by internal medicine. Cardiac enzymes were cycled 3 and were mildly abnormal at 0.08, 0.08 and 0.06. He is currently CP free. He does not feel that he may have pulled a muscle at the gym.   Home Medications  Prior to Admission medications   Medication Sig Start Date End Date Taking? Authorizing Provider  methocarbamol (ROBAXIN) 500 MG tablet Take 1 tablet (500 mg total) by mouth 4 (four) times daily. As needed for muscle  spasm Patient not taking: Reported on 09/20/2015 04/07/13   Gaynelle Arabian, MD  ranitidine (ZANTAC) 150 MG tablet Take 150 mg by mouth 2 (two) times daily. Reported on 09/20/2015    Historical Provider, MD    Family History  Family History  Problem Relation Age of Onset  . Hypertension Mother     Social History  Social History   Social History  . Marital Status: Married    Spouse Name: N/A  . Number of Children: N/A  . Years of Education: N/A   Occupational History  . Not on file.    Social History Main Topics  . Smoking status: Never Smoker   . Smokeless tobacco: Former Systems developer    Quit date: 09/16/2001  . Alcohol Use: Yes     Comment: socially  . Drug Use: Yes    Special: Marijuana     Comment: 20 years ago  . Sexual Activity: Not on file   Other Topics Concern  . Not on file   Social History Narrative     Review of Systems General:  No chills, fever, night sweats or weight changes.  Cardiovascular:  No chest pain, dyspnea on exertion, edema, orthopnea, palpitations, paroxysmal nocturnal dyspnea. Dermatological: No rash, lesions/masses Respiratory: No cough, dyspnea Urologic: No hematuria, dysuria Abdominal:   No nausea, vomiting, diarrhea, bright red blood per rectum, melena, or hematemesis Neurologic:  No visual changes, wkns, changes in mental status. All other systems reviewed and are otherwise negative except as noted above.  Physical Exam  Blood pressure 146/79, pulse 77, temperature 97.8 F (36.6 C), temperature source Oral, resp. rate 17, SpO2 95 %.  General: Pleasant, NAD Psych: Normal affect. Neuro: Alert and oriented X 3. Moves all extremities spontaneously. HEENT: Normal  Neck: Supple without bruits or JVD. Lungs:  Resp regular and unlabored, CTA. Heart: RRR no s3, s4, or murmurs. Abdomen: Soft, non-tender, non-distended, BS + x 4.  Extremities: No clubbing, cyanosis or edema. DP/PT/Radials 2+ and equal bilaterally.  Labs  Troponin [facility] Test)  Recent Labs  09/20/15 1944  TROPIPOC 0.06    Recent Labs  09/20/15 2151 09/21/15 0207 09/21/15 0448  TROPONINI 0.08* 0.08* 0.06*   Lab Results  Component Value Date   WBC 8.4 09/20/2015   HGB 14.3 09/20/2015   HCT 42.3 09/20/2015   MCV 87.2 09/20/2015   PLT 256 09/20/2015     Recent Labs Lab 09/20/15 1932  NA 141  K 3.1*  CL 101  CO2 29  BUN 13  CREATININE 1.13  CALCIUM 9.4  GLUCOSE 90   Lab Results  Component Value Date   CHOL 184 09/21/2015   HDL 38*  09/21/2015   LDLCALC 125* 09/21/2015   TRIG 106 09/21/2015   Lab Results  Component Value Date   DDIMER 0.29 09/20/2015     Radiology/Studies  Dg Chest 2 View  09/20/2015  CLINICAL DATA:  Chest pain since 0900 today EXAM: CHEST  2 VIEW COMPARISON:  04/01/2013 and multiple prior studies FINDINGS: Heart size upper normal. Vascular pattern normal. Lungs clear. No pleural effusion. IMPRESSION: No active cardiopulmonary disease. Electronically Signed   By: Skipper Cliche M.D.   On: 09/20/2015 19:45    ECG  NSR. Left ventricular hypertrophy with repolarization abnormality; there are T-wave inversions noted in I and aVL that seem to be chronic, however there are biphasic T-wave inversions mild ST depression in V5 and V6 with biphasic T waves in the 3 and V4.  These findings are  new when compared to 2014.   ASSESSMENT AND PLAN  Principal Problem:   Atypical angina (HCC) Active Problems:   New-onset angina ([place])   Essential hypertension   Chest pain   Obstructive sleep apnea    1. ACS/unstable angina (new onset atypical angina) The patient's recent symptoms are atypical however he's had mildly elevated troponins 3 although very flat trend. He also had relief of symptoms with nitroglycerin and aspirin. His admission EKG was also concerning for possible inferolateral ischemia given ST/T-wave abnormalities. This is new compared to prior EKG in 2014. He is currently CP free and resting comfortably. Given his concerning EKGs, we will need to  r/o underlying coronary ischemia. We will arrange for definitive LHC in the am. For now continue ASA. Recommend statin given elevated LDL of 125. We will start Lipitor 80 mg.   2. HTN: His BP is mildly elevated. Given concerns for CAD, he will benefit from addition of a BB. Will add Coreg 3.25 mg BID.   3. HLD: Recommend statin given elevated LDL of 125. Will start Lipitor 80 mg.    4. OSA: compliant with CPAP.    Signed, Lyda Jester,  PA-C 09/21/2015, 3:05 PM  I saw and evaluated the patient this afternoon after Ellen Henri, PA-C saw him. We reviewed the patient's chart and all available data. We discussed his presentation. I agree with her findings, examination and recommendations.  Long talk with Mr. Wiland about his presentation. His symptoms of pinching pinpoint types of chest discomfort is very atypical for anginal symptom. He could not tell me that it was sterilely exacerbated by any particular activity. It occurred after exercising. He has not tried to necessarily exert himself since the onset of symptoms however.  The symptoms are relatively atypical, however they have now become persistent over the last 2 days. What is more concerning is that there is a low level troponin elevation and abnormal findings on EKG that are concerning for ischemia.  I discussed several options with the patient as far as how to manage this. My inclination would be that with the EKG changes and mild troponin elevation that we should consider this to be ACS in which case I would recommend conclusive evaluation with cardiac catheterization. I did tell him the options would be to consider a Myoview stress test tomorrow versus cardiac catheterization tomorrow. He indicated that he would prefer for her to discuss with his wife and "pray on it".  He is on aspirin. We have started statin and low-dose carvedilol given concerns for possible CAD.  I have tentatively scheduled him for cardiac catheterization tomorrow afternoon, but also tentatively ordered a nuclear stress test. We will discuss his thoughts in the morning.  Continue CPAP.   Leonie Man, M.D., M.S. Interventional Cardiologist   Pager # [phone] Phone # [phone] [address] [place], [place] [address]

## 2015-09-21 NOTE — Progress Notes (Signed)
[name] PROGRESS NOTE  Joshua Stanton [id number] DOB: 01-Dec-1960 DOA: 09/20/2015 PCP: [name], MD  HPI Joshua Stanton is a 54 year old male who presented to the ED on 09/20/2015 due to recurrent chest pain from 0900-1400 that day. He describes the pain as "throbbing, pulsing" adjacent the lower left sternal boarder, not accompanied by diaphoresis, pain radiation or SOB.   Subjective: Patient reports feeling "great" today. He endorses one episode of CP early this morning, which was relieved by NTG. He calls CP "annoying". Each episode lasts 10-20 seconds and is a 6-7/10 in severity.   Assessment/Plan:  Principal Problem:  Chest pain: with atypical features, not similar to his usual GERD symptoms per patient.However has T-wave inversions in EKG. D Dimer negative. Await cardiology evaluation for risk stratification prior to discharge.   Active Problems:  HTN (hypertension): BP fairly well controlled since admission. Continue hydralazine as needed.Resume Maxzide prior to discharge.  Obstructive sleep apnea: Continue using CPAP.  GERD: Largely controlled with diet and exercise.  Continue Pepcid.  Hypokalemia: Appears to be a chronic problem likely due to diuretics. Oral potassium supplement provided x 2 days.  Code Status: Full Family Communication: None at bedside Disposition Plan: Remain inpatient DVT Prophylaxis: Heparin   Consultants:  Cardiology  Procedures:  None  Antibiotics:  None    Objective: Filed Vitals:   09/21/15 0128 09/21/15 0602  BP: 126/81 114/62  Pulse: 75 64  Temp:  97.8 F (36.6 C)  Resp:  18    Intake/Output Summary (Last 24 hours) at 09/21/15 1107 Last data filed at 09/21/15 0730  Gross per 24 hour  Intake      0 ml  Output      0 ml  Net      0 ml   There were no vitals filed for this visit.  Exam:   General: WDWN, in NAD. Alert. Supine in bed.   Cardiovascular: RRR no M/R/G. No  edema.  Respiratory: CTABL. No adventitious breath sounds.  Abdomen: Soft, NT, ND. +BS.  Musculoskeletal: Good tone. Moves all extremities against gravity.   Data Reviewed: Basic Metabolic Panel:  Recent Labs Lab 09/20/15 1932 09/21/15 0254  NA 141  --   K 3.1*  --   CL 101  --   CO2 29  --   GLUCOSE 90  --   BUN 13  --   CREATININE 1.13  --   CALCIUM 9.4  --   MG  --  2.1  PHOS  --  3.4   Liver Function Tests: No results for input(s): AST, ALT, ALKPHOS, BILITOT, PROT, ALBUMIN in the last 168 hours. No results for input(s): LIPASE, AMYLASE in the last 168 hours. No results for input(s): AMMONIA in the last 168 hours. CBC:  Recent Labs Lab 09/20/15 1932  WBC 8.4  HGB 14.3  HCT 42.3  MCV 87.2  PLT 256   Cardiac Enzymes:  Recent Labs Lab 09/20/15 2151 09/21/15 0207 09/21/15 0448  TROPONINI 0.08* 0.08* 0.06*   BNP (last 3 results) No results for input(s): BNP in the last 8760 hours.  ProBNP (last 3 results) No results for input(s): PROBNP in the last 8760 hours.  CBG: No results for input(s): GLUCAP in the last 168 hours.  Recent Results (from the past 240 hour(s))  MRSA PCR Screening     Status: None   Collection Time: 09/20/15 11:50 PM  Result Value Ref Range Status   MRSA by PCR NEGATIVE NEGATIVE Final  Comment:        The GeneXpert MRSA Assay (FDA approved for NASAL specimens only), is one component of a comprehensive MRSA colonization surveillance program. It is not intended to diagnose MRSA infection nor to guide or monitor treatment for MRSA infections.      Studies: Dg Chest 2 View  09/20/2015  CLINICAL DATA:  Chest pain since 0900 today EXAM: CHEST  2 VIEW COMPARISON:  04/01/2013 and multiple prior studies FINDINGS: Heart size upper normal. Vascular pattern normal. Lungs clear. No pleural effusion. IMPRESSION: No active cardiopulmonary disease. Electronically Signed   By: Skipper Cliche M.D.   On: 09/20/2015 19:45    Scheduled  Meds: . aspirin EC  325 mg Oral Daily  . famotidine  20 mg Oral Daily  . heparin  5,000 Units Subcutaneous 3 times per day  . LORazepam  0.5 mg Oral Once  . methocarbamol  500 mg Oral TID AC & HS   Continuous Infusions:   Time spent 30 minutes-Greater than 50% of this time was spent in counseling, explanation of diagnosis, planning of further management, and coordination of care.   Marya Amsler, PhD, PA-S  [name] . If 7PM-7AM, please contact night-coverage at [email or web address], password [facility] 09/21/2015, 11:07 AM  LOS: 1 day    Attending MD note  Patient was seen, examined,treatment plan was discussed with the PA-S.  I have personally reviewed the clinical findings, lab, imaging studies and management of this patient in detail. I agree with the documentation, as recorded by the PA-S.   Patient is a 55 yo male with hx of HTN-presented with chest pain-mostly with atypical features-EKG with t wave inversions-mostly in lateral leads-D Dimer negative. Exam-pain is not reproducible. Await cards eval for risk stratification.   Rest as above.   [facility]

## 2015-09-21 NOTE — Progress Notes (Signed)
Patient lying in bed, pain meds given. Advised patient of cath in the morning. He does not want it at this time, feels like he should have a stress test done first. States he was not advised of the plan to skip the stress test and have the cath. Wants to talk to the physician in the morning. Advised patient that he would still be NPO after midnight. Call light within reach.

## 2015-09-21 NOTE — Progress Notes (Addendum)
RN paged because pt was c/o CP on the unit. NTG x 1 given and CP relieved. EKG repeated and the ST depression seen earlier is not appreciated on this EKG. VSS. 1st troponin .08. Continue to cycle. DDimer 0.29. The H&P for pt wasn't complete, so this NP called admitting MD, Dr. Aggie Moats, to talk about pt. Discussed labs and CP at present. Dr. Aggie Moats stated he was not thinking pt had PE-no risk factors, no hypoxia and no tachycardia. Will continue to cycle troponins and follow. Call cardiology as warranted.  KJKG, NP [facility]  Update: 2nd troponin being run. Went to bedside to talk with pt. Pt says his CP now is a 0/10. Says CP was a 7/10 upon admission and a 5/10 at last occurrence. He describes the pain as "throbbing, pulsing" pain that is dull sometimes and sharp others.Location is left sternum. No radiation of pain to the jaw or arm. No n/v or diaphoresis associated with the pain. No SOB. He denies any "heart" problems in the past. Had a negative stress test "years ago". Never had MI. Doesn't take cholesterol meds. The pain is not reproducible with palpation on exam. He appears very well and comfortable at present. No tachycardia. O2 sat normal on RA. BP stable.  Troponin pending now. If further elevated, will consider Heparin gtt and call cards. Check troponins also at 5am and 11am. Check lipids at 0900. NPO except water and black coffee til then. Sugars normal since admission. No hx DM II on chart.  KJKG, NP [facility] Update: 2nd troponin same at 0.08. Calling cardiology to discuss pt.  Discussed case with cardio fellow, Dr. Marsa Aris. He reviewed chart and EKG x 2. Agrees that 2nd EKG appears better than one in ED. Since troponins are flat, will not start Heparin at this time. Continue to cycle troponins. Can get official cards consult in am if needed.  KJKG, NP [facility] Update: 3rd troponin decreased to 0.06. No further c/o CP. VS good.  KJKG, NP [facility]

## 2015-09-22 ENCOUNTER — Inpatient Hospital Stay (HOSPITAL_COMMUNITY): Payer: 59

## 2015-09-22 ENCOUNTER — Inpatient Hospital Stay (HOSPITAL_BASED_OUTPATIENT_CLINIC_OR_DEPARTMENT_OTHER): Payer: 59

## 2015-09-22 ENCOUNTER — Other Ambulatory Visit: Payer: Self-pay | Admitting: Cardiology

## 2015-09-22 ENCOUNTER — Encounter (HOSPITAL_COMMUNITY)
Admission: EM | Disposition: A | Discharge status: Home / Self Care | Payer: Self-pay | Source: Home / Self Care | Attending: Family Medicine

## 2015-09-22 DIAGNOSIS — K219 Gastro-esophageal reflux disease without esophagitis: Secondary | ICD-10-CM

## 2015-09-22 DIAGNOSIS — I209 Angina pectoris, unspecified: Secondary | ICD-10-CM

## 2015-09-22 DIAGNOSIS — I208 Other forms of angina pectoris: Secondary | ICD-10-CM | POA: Diagnosis not present

## 2015-09-22 DIAGNOSIS — I10 Essential (primary) hypertension: Secondary | ICD-10-CM | POA: Diagnosis not present

## 2015-09-22 DIAGNOSIS — R7989 Other specified abnormal findings of blood chemistry: Secondary | ICD-10-CM | POA: Diagnosis not present

## 2015-09-22 DIAGNOSIS — G4733 Obstructive sleep apnea (adult) (pediatric): Secondary | ICD-10-CM | POA: Diagnosis not present

## 2015-09-22 DIAGNOSIS — R9431 Abnormal electrocardiogram [ECG] [EKG]: Secondary | ICD-10-CM | POA: Diagnosis not present

## 2015-09-22 LAB — CBC
HEMATOCRIT: 43.4 % (ref 39.0–52.0)
Hemoglobin: 14 g/dL (ref 13.0–17.0)
MCH: 28.6 pg (ref 26.0–34.0)
MCHC: 32.3 g/dL (ref 30.0–36.0)
MCV: 88.6 fL (ref 78.0–100.0)
PLATELETS: 234 10*3/uL (ref 150–400)
RBC: 4.9 MIL/uL (ref 4.22–5.81)
RDW: 13.7 % (ref 11.5–15.5)
WBC: 7.4 10*3/uL (ref 4.0–10.5)

## 2015-09-22 LAB — BASIC METABOLIC PANEL
ANION GAP: 10 (ref 5–15)
BUN: 11 mg/dL (ref 6–20)
CHLORIDE: 104 mmol/L (ref 101–111)
CO2: 27 mmol/L (ref 22–32)
Calcium: 8.9 mg/dL (ref 8.9–10.3)
Creatinine, Ser: 1.09 mg/dL (ref 0.61–1.24)
Glucose, Bld: 112 mg/dL — ABNORMAL HIGH (ref 65–99)
POTASSIUM: 3.3 mmol/L — AB (ref 3.5–5.1)
SODIUM: 141 mmol/L (ref 135–145)

## 2015-09-22 LAB — PROTIME-INR
INR: 1.03 (ref 0.00–1.49)
PROTHROMBIN TIME: 13.7 s (ref 11.6–15.2)

## 2015-09-22 LAB — NM MYOCAR MULTI W/SPECT W/WALL MOTION / EF
CHL CUP NUCLEAR SDS: 0
LHR: 0.33
LV sys vol: 91 mL
LVDIAVOL: 163 mL (ref 62–150)
NUC STRESS TID: 1.2
SRS: 5
SSS: 5

## 2015-09-22 SURGERY — LEFT HEART CATH AND CORONARY ANGIOGRAPHY

## 2015-09-22 MED ORDER — TECHNETIUM TC 99M SESTAMIBI GENERIC - CARDIOLITE
10.0000 | Freq: Once | INTRAVENOUS | Status: AC | PRN
  Administered 2015-09-22: 10 via INTRAVENOUS

## 2015-09-22 MED ORDER — RANITIDINE HCL 150 MG PO TABS: 150.0000 mg | ORAL_TABLET | Freq: Two times a day (BID) | ORAL | Status: DC

## 2015-09-22 MED ORDER — ATORVASTATIN CALCIUM 20 MG PO TABS: 20.0000 mg | ORAL_TABLET | Freq: Every day | ORAL | Status: DC

## 2015-09-22 MED ORDER — ASPIRIN 81 MG PO TBEC: 81.0000 mg | DELAYED_RELEASE_TABLET | Freq: Every day | ORAL | Status: DC

## 2015-09-22 MED ORDER — CARVEDILOL 6.25 MG PO TABS
6.2500 mg | ORAL_TABLET | Freq: Two times a day (BID) | ORAL | Status: DC
Start: 2015-09-22 — End: 2015-10-10

## 2015-09-22 MED ORDER — REGADENOSON 0.4 MG/5ML IV SOLN
0.4000 mg | Freq: Once | INTRAVENOUS | Status: AC
  Administered 2015-09-22: 0.4 mg via INTRAVENOUS
  Filled 2015-09-22: qty 5

## 2015-09-22 MED ORDER — POTASSIUM CHLORIDE CRYS ER 20 MEQ PO TBCR
40.0000 meq | EXTENDED_RELEASE_TABLET | Freq: Once | ORAL | Status: AC
  Administered 2015-09-22: 40 meq via ORAL
  Filled 2015-09-22: qty 2

## 2015-09-22 MED ORDER — REGADENOSON 0.4 MG/5ML IV SOLN
INTRAVENOUS | Status: AC
  Filled 2015-09-22: qty 5

## 2015-09-22 MED ORDER — NITROGLYCERIN 0.4 MG SL SUBL: 0.4000 mg | SUBLINGUAL_TABLET | SUBLINGUAL | Status: DC | PRN

## 2015-09-22 NOTE — Progress Notes (Signed)
.    Patient Profile: 54 year-old African-American male with history of treated hypertension as well as prediabetes and history of GERD, admitted to the hospital for chest pain evaluation. Also with mild troponin elevation and abnormal EKG.  Subjective: No issues overnight. He denies any further CP. He remains hesitant about undergoing LHC. He considered his options overnight and wishes to start with NST first. He understands that if his stress test is abnormal, LHC would be the next step in making a diagnosis. He also has concerns regarding BB therapy, particularly potential side effect of ED.   Objective: Vital signs in last 24 hours: Temp:  [97.8 F (36.6 C)-98.2 F (36.8 C)] 98.2 F (36.8 C) (03/10 0316) Pulse Rate:  [64-80] 64 (03/10 0316) Resp:  [17-18] 18 (03/10 0316) BP: (110-148)/(63-93) 110/63 mmHg (03/10 0316) SpO2:  [93 %-98 %] 93 % (03/10 0316) Last BM Date: 09/21/15  Intake/Output from previous day: 03/09 0701 - 03/10 0700 In: 600 [P.O.:600] Out: 2000 [Urine:2000] Intake/Output this shift:    Medications Current Facility-Administered Medications  Medication Dose Route Frequency Provider Last Rate Last Dose  . 0.9 %  sodium chloride infusion  250 mL Intravenous PRN Brittainy Erie Noe, PA-C      . 0.9% sodium chloride infusion  125 mL/hr Intravenous Continuous Brittainy M Simmons, PA-C      . acetaminophen (TYLENOL) tablet 650 mg  650 mg Oral Q4H PRN Elwin Mocha, MD   650 mg at 09/22/15 0427  . aspirin EC tablet 325 mg  325 mg Oral Daily Elwin Mocha, MD   325 mg at 09/21/15 0841  . atorvastatin (LIPITOR) tablet 80 mg  80 mg Oral q1800 Brittainy Erie Noe, PA-C   80 mg at 09/21/15 1754  . carvedilol (COREG) tablet 3.125 mg  3.125 mg Oral BID WC Brittainy M Simmons, PA-C   3.125 mg at 09/22/15 [id number]  . diphenhydrAMINE (BENADRYL) injection 25 mg  25 mg Intravenous Daily PRN Elwin Mocha, MD      . famotidine (PEPCID) tablet 20 mg  20 mg Oral Daily Elwin Mocha, MD   20 mg at 09/21/15 0841  . heparin injection 5,000 Units  5,000 Units Subcutaneous 3 times per day Elwin Mocha, MD   5,000 Units at 09/22/15 [phone]  . hydrALAZINE (APRESOLINE) injection 10 mg  10 mg Intravenous Q8H PRN Elwin Mocha, MD      . LORazepam (ATIVAN) tablet 0.5 mg  0.5 mg Oral Once Gardiner Barefoot, NP      . methocarbamol (ROBAXIN) tablet 500 mg  500 mg Oral TID AC & HS Elwin Mocha, MD   500 mg at 09/21/15 2107  . morphine 2 MG/ML injection 2 mg  2 mg Intravenous Q2H PRN Elwin Mocha, MD   2 mg at 09/21/15 2105  . nitroGLYCERIN (NITROSTAT) SL tablet 0.4 mg  0.4 mg Sublingual Q5 min PRN Gardiner Barefoot, NP   0.4 mg at 09/21/15 0123  . ondansetron (ZOFRAN) injection 4 mg  4 mg Intravenous Q6H PRN Elwin Mocha, MD      . sodium chloride flush (NS) 0.9 % injection 3 mL  3 mL Intravenous Q12H Brittainy Erie Noe, PA-C   3 mL at 09/21/15 2107  . sodium chloride flush (NS) 0.9 % injection 3 mL  3 mL Intravenous PRN Brittainy M Simmons, PA-C      . zolpidem (AMBIEN) tablet 5 mg  5 mg Oral QHS PRN,MR X 1  Elwin Mocha, MD   5 mg at 09/21/15 2347    PE: General appearance: alert, cooperative and no distress Neck: no carotid bruit and no JVD Lungs: clear to auscultation bilaterally Heart: regular rate and rhythm, S1, S2 normal, no murmur, click, rub or gallop Extremities: no LEE Pulses: 2+ and symmetric Skin: warm and dry Neurologic: Grossly normal  Lab Results:   Recent Labs  09/20/15 1932 09/22/15 0302  WBC 8.4 7.4  HGB 14.3 14.0  HCT 42.3 43.4  PLT 256 234   BMET  Recent Labs  09/20/15 1932 09/22/15 0302  NA 141 141  K 3.1* 3.3*  CL 101 104  CO2 29 27  GLUCOSE 90 112*  BUN 13 11  CREATININE 1.13 1.09  CALCIUM 9.4 8.9   PT/INR  Recent Labs  09/22/15 0302  LABPROT 13.7  INR 1.03   Lipid Panel     Component Value Date/Time   CHOL 184 09/21/2015 0915   TRIG 106 09/21/2015 0915   HDL 38* 09/21/2015 0915   CHOLHDL 4.8  09/21/2015 0915   VLDL 21 09/21/2015 0915   LDLCALC 125* 09/21/2015 0915     Cardiac Panel (last 3 results)  Recent Labs  09/20/15 2151 09/21/15 0207 09/21/15 0448  TROPONINI 0.08* 0.08* 0.06*    Studies/Results: NST- pending   Assessment/Plan    Principal Problem:   Atypical angina (HCC) Active Problems:   Chest pain   Essential hypertension   Obstructive sleep apnea   New-onset angina (HCC)   Abnormal finding on EKG - anterolateral biphasic T waves with ST depression   Elevated troponin   1. ACS/unstable angina (new onset atypical angina):  remains CP free. No events overnight. NSR on telemetry. Definitive LHC was recommended, however patient remains hesitant at this point. He considered his options overnight and wishes to start with NST first, to assess for ischemia. He understands that if his stress test is abnormal, LHC would be the next step in making a diagnosis. For now, continue medical therapy with ASA, high intensity statin and BB.   2. HTN: BP is better controlled this morning. Given concerns for CAD, we added a low dose BB, Coreg 3.25 mg BID. BP and HR are both stable. Patient now has concerns regarding BB therapy, particularly potential side effect of ED. Will discuss with MD potentially changing him to nebivolol.   3. HLD: LDL is abnormal at 125 mg/dL. He has been started on high intensity statin therapy with Lipitor, 80 mg nightly. If ischemic w/u is positive for CAD, his LDL goal will be <70 mg/dL. However if negative ischemic w/u, can target LDL goal to <100 mg/dL. He will need repeat FLP and HFTs in 6 weeks.   4. Hypokalemia: K is 3.3. Will give supplemental K-Dur, 40 mEq x 1.     LOS: 2 days    Brittainy M. Rosita Fire, PA-C 09/22/2015 8:01 AM  I have seen, examined and evaluated the patient this AM along with Ms. Simmons,PA-c.  After reviewing all the available data and chart,  I agree with her findings, examination as well as impression  recommendations.  Concerning symptoms of atypical angina. He has had no further episodes and she has been in the hospital. He decided overnight. Prefer to proceed with noninvasive evaluation. Therefore he is on the schedule for a Myoview stress test today. We could do treadmill Myoview to get physiologic date as well. He is on aspirin and statin. I started low-dose beta blocker. He is concerns  about possible erectile dysfunction. If he does have a side effect, we can consider switching him to [employer].  His main focus is to exclude any significant coronary disease or other risks that would keep him from being alert get back into exercising. He would like to get back to his exercise routine in order to continue losing weight and work on glycemic control.  More to follow pending results of his nuclear stress test.   Leonie Man, M.D., M.S. Interventional Cardiologist   Pager # [phone] Phone # [phone] [address]. [place], [place] [address]

## 2015-09-22 NOTE — Discharge Summary (Signed)
PATIENT DETAILS Name: Joshua Stanton Age: 55 y.o. Sex: male Date of Birth: 08/24/1960 MRN: [id number]. Admitting Physician: Annita Brod, MD [id number], Gwyndolyn Saxon, MD  Admit Date: 09/20/2015 Discharge date: 09/22/2015  Recommendations for Outpatient Follow-up:  1. Please ensure follow-up with cardiology  2. If continues to have chest pain-may need a cardiac catheterization in the future.  3. New medications-aspirin, Lipitor, Coreg.  4. Please repeat CBC/BMET at next visit   PRIMARY DISCHARGE DIAGNOSIS:  Principal Problem:   Atypical angina (HCC) Active Problems:   Chest pain   Essential hypertension   Obstructive sleep apnea   New-onset angina (HCC)   Abnormal finding on EKG - anterolateral biphasic T waves with ST depression   Elevated troponin      PAST MEDICAL HISTORY: Past Medical History  Diagnosis Date  . Sleep apnea     uses cpap  . Hypertension   . GERD (gastroesophageal reflux disease)   . Seasonal allergies   . Arthritis     DISCHARGE MEDICATIONS: Current Discharge Medication List    START taking these medications   Details  aspirin EC 81 MG EC tablet Take 1 tablet (81 mg total) by mouth daily.    atorvastatin (LIPITOR) 20 MG tablet Take 1 tablet (20 mg total) by mouth daily at 6 PM. Qty: 30 tablet, Refills: 0    carvedilol (COREG) 6.25 MG tablet Take 1 tablet (6.25 mg total) by mouth 2 (two) times daily with a meal. Qty: 60 tablet, Refills: 0    nitroGLYCERIN (NITROSTAT) 0.4 MG SL tablet Place 1 tablet (0.4 mg total) under the tongue every 5 (five) minutes as needed for chest pain. Qty: 20 tablet, Refills: 0      CONTINUE these medications which have CHANGED   Details  ranitidine (ZANTAC) 150 MG tablet Take 1 tablet (150 mg total) by mouth 2 (two) times daily. Reported on 09/20/2015 Qty: 60 tablet, Refills: 0      STOP taking these medications     methocarbamol (ROBAXIN) 500 MG tablet         ALLERGIES:   Allergies  Allergen  Reactions  . Oxycodone-Acetaminophen Other (See Comments)    Nausea    BRIEF HPI:  See H&P, Labs, Consult and Test reports for all details in brief, patient was admitted for evaluation of chest pain.  CONSULTATIONS:   cardiology  PERTINENT RADIOLOGIC STUDIES: Dg Chest 2 View  09/20/2015  CLINICAL DATA:  Chest pain since 0900 today EXAM: CHEST  2 VIEW COMPARISON:  04/01/2013 and multiple prior studies FINDINGS: Heart size upper normal. Vascular pattern normal. Lungs clear. No pleural effusion. IMPRESSION: No active cardiopulmonary disease. Electronically Signed   By: Skipper Cliche M.D.   On: 09/20/2015 19:45   Nm Myocar Multi W/spect W/wall Motion / Ef  09/22/2015   T wave inversion was noted during stress.  This is a low risk study.  The left ventricular ejection fraction is moderately decreased (30-44%).  Nuclear stress EF: 44%.  1. Fixed, small mild basal to mid inferolateral defect.  Possible attenuation.  No ischemia. 2. EF 44% with diffuse hypokinesis. 3. Overall low risk study.     PERTINENT LAB RESULTS: CBC:  Recent Labs  09/20/15 1932 09/22/15 0302  WBC 8.4 7.4  HGB 14.3 14.0  HCT 42.3 43.4  PLT 256 234   CMET CMP     Component Value Date/Time   NA 141 09/22/2015 0302   K 3.3* 09/22/2015 0302   CL 104 09/22/2015 0302  CO2 27 09/22/2015 0302   GLUCOSE 112* 09/22/2015 0302   BUN 11 09/22/2015 0302   CREATININE 1.09 09/22/2015 0302   CALCIUM 8.9 09/22/2015 0302   GFRNONAA >60 09/22/2015 0302   GFRAA >60 09/22/2015 0302    GFR CrCl cannot be calculated (Unknown ideal weight.). No results for input(s): LIPASE, AMYLASE in the last 72 hours.  Recent Labs  09/20/15 2151 09/21/15 0207 09/21/15 0448  TROPONINI 0.08* 0.08* 0.06*   Invalid input(s): POCBNP  Recent Labs  09/20/15 1932  DDIMER 0.29   No results for input(s): HGBA1C in the last 72 hours.  Recent Labs  09/21/15 0915  CHOL 184  HDL 38*  LDLCALC 125*  TRIG 106  CHOLHDL 4.8   No  results for input(s): TSH, T4TOTAL, T3FREE, THYROIDAB in the last 72 hours.  Invalid input(s): FREET3 No results for input(s): VITAMINB12, FOLATE, FERRITIN, TIBC, IRON, RETICCTPCT in the last 72 hours. Coags:  Recent Labs  09/22/15 0302  INR 1.03   Microbiology: Recent Results (from the past 240 hour(s))  MRSA PCR Screening     Status: None   Collection Time: 09/20/15 11:50 PM  Result Value Ref Range Status   MRSA by PCR NEGATIVE NEGATIVE Final    Comment:        The GeneXpert MRSA Assay (FDA approved for NASAL specimens only), is one component of a comprehensive MRSA colonization surveillance program. It is not intended to diagnose MRSA infection nor to guide or monitor treatment for MRSA infections.      BRIEF HOSPITAL COURSE:   Principal Problem:  Chest pain: With mostly atypical features, however had new T-wave inversions. Cardiology was consulted, patient was offered a cardiac catheterization, however he refused and wanted to have a nuclear stress test instead. He subsequently underwent a nuclear stress test on 09/22/15, nuclear stress stress is read as a low risk study. Case was discussed with Dr. Nicole Cella the phone, he recommends that we continue antiplatelets, statin and beta blockers on discharge. If patient were to have recurrent chest pain in the future, at that point a cardiac catheterization would be indicated. This was discussed with the patient and family at bedside, he was agreeable. Please note, d-dimer was negative. Please also note no further chest pain since admission.  Active Problems: Hypertension: Will transition to Coreg on discharge. We will discontinue Maxzide. Please evaluate and optimize at next visit.  Obstructive sleep apnea: Continue CPAP.  Hypokalemia: Likely secondary to diuretics, repeated-please recheck at next visit.  GERD: Continue H2 blocker.  TODAY-DAY OF DISCHARGE:  Subjective:   Lavi Lavery today has no headache,no  chest abdominal pain,no new weakness tingling or numbness, feels much better wants to go home today.   Objective:   Blood pressure 139/86, pulse 87, temperature 98.2 F (36.8 C), temperature source Oral, resp. rate 18, SpO2 93 %.  Intake/Output Summary (Last 24 hours) at 09/22/15 1703 Last data filed at 09/21/15 1843  Gross per 24 hour  Intake      0 ml  Output    650 ml  Net   -650 ml   There were no vitals filed for this visit.  Exam Awake Alert, Oriented *3, No new F.N deficits, Normal affect Canadian.AT,PERRAL Supple Neck,No JVD, No cervical lymphadenopathy appriciated.  Symmetrical Chest wall movement, Good air movement bilaterally, CTAB RRR,No Gallops,Rubs or new Murmurs, No Parasternal Heave +ve B.Sounds, Abd Soft, Non tender, No organomegaly appriciated, No rebound -guarding or rigidity. No Cyanosis, Clubbing or edema, No new Rash or  bruise  DISCHARGE CONDITION: Stable  DISPOSITION: Home  DISCHARGE INSTRUCTIONS:    Activity:  As tolerated   Get Medicines reviewed and adjusted: Please take all your medications with you for your next visit with your Primary MD  Please request your Primary MD to go over all hospital tests and procedure/radiological results at the follow up, please ask your Primary MD to get all Hospital records sent to his/her office.  If you experience worsening of your admission symptoms, develop shortness of breath, life threatening emergency, suicidal or homicidal thoughts you must seek medical attention immediately by calling 911 or calling your MD immediately  if symptoms less severe.  You must read complete instructions/literature along with all the possible adverse reactions/side effects for all the Medicines you take and that have been prescribed to you. Take any new Medicines after you have completely understood and accpet all the possible adverse reactions/side effects.   Do not drive when taking Pain medications.   Do not take more than  prescribed Pain, Sleep and Anxiety Medications  Special Instructions: If you have smoked or chewed Tobacco  in the last 2 yrs please stop smoking, stop any regular Alcohol  and or any Recreational drug use.  Wear Seat belts while driving.  Please note  You were cared for by a hospitalist during your hospital stay. Once you are discharged, your primary care physician will handle any further medical issues. Please note that NO REFILLS for any discharge medications will be authorized once you are discharged, as it is imperative that you return to your primary care physician (or establish a relationship with a primary care physician if you do not have one) for your aftercare needs so that they can reassess your need for medications and monitor your lab values.   Diet recommendation: Heart Healthy diet  Discharge Instructions    Call MD for:  persistant nausea and vomiting    Complete by:  As directed      Call MD for:  severe uncontrolled pain    Complete by:  As directed      Diet - low sodium heart healthy    Complete by:  As directed      Increase activity slowly    Complete by:  As directed            Follow-up Information    Follow up with [name], MD. Schedule an appointment as soon as possible for a visit in 1 week.   Specialty:  Family Medicine   Contact information:   [place] [address] [phone]       Follow up with Leonie Man, MD.   Specialty:  Cardiology   Why:  Office will call with date/time   Contact information:   [address] [place] [address] [phone]      Total Time spent on discharge equals 25  minutes.  SignedOren Binet 09/22/2015 5:03 PM

## 2015-09-22 NOTE — Progress Notes (Signed)
Utilization review completed. Aubriana Ravelo, RN, BSN. 

## 2015-09-22 NOTE — Progress Notes (Signed)
Talked with patient again about prepping for Cath this afternoon. Told me that he was "leaning towards not doing it." Advised him that we will continue to keep him NPO. He did agree to go ahead and take the 81 mg aspirin.. Will advise day shift. Call light within reach.

## 2015-09-22 NOTE — Progress Notes (Signed)
Placed patient on CPAP for the night via auto-mode with minimum pressure set at 5cm and maximum pressure set at 20cm  

## 2015-09-22 NOTE — Progress Notes (Addendum)
Completed UR, changed to observation per medical advisor. Jonnie Finner RN CCM Case Mgmt phone [phone]

## 2015-09-22 NOTE — Progress Notes (Signed)
  Official read pending by [facility] following stress images. No information was transferred via [facility]. EKG results are printed out and available.  Signed, Erma Heritage, PA-C 09/22/2015, 2:23 PM Pager: [phone]

## 2015-09-22 NOTE — Care Management Note (Signed)
Case Management Note  Patient Details  Name: Joshua Stanton MRN: [id number] Date of Birth: 02-09-1961  Subjective/Objective:   Chest pain                 Action/Plan: NCM spoke to wife at bedside. Pt can afford medications at home. Pt is independent. No NCM needs identified.   PCP-William Harris MD  Expected Discharge Date:                  Expected Discharge Plan:  Home/Self Care  In-House Referral:  NA  Discharge planning Services  CM Consult  Post Acute Care Choice:  NA Choice offered to:  NA  DME Arranged:  N/A DME Agency:  NA  HH Arranged:  NA HH Agency:  NA  Status of Service:  Completed, signed off  Medicare Important Message Given:    Date Medicare IM Given:    Medicare IM give by:    Date Additional Medicare IM Given:    Additional Medicare Important Message give by:     If discussed at [place] of Stay Meetings, dates discussed:    Additional Comments:  Erenest Rasher, RN 09/22/2015, 6:05 PM

## 2015-09-27 ENCOUNTER — Observation Stay (HOSPITAL_COMMUNITY)
Admission: EM | Admit: 2015-09-27 | Discharge: 2015-09-29 | Disposition: A | Discharge status: Home / Self Care | Payer: 59 | Attending: Internal Medicine | Admitting: Internal Medicine

## 2015-09-27 ENCOUNTER — Encounter (HOSPITAL_COMMUNITY): Payer: Self-pay | Admitting: *Deleted

## 2015-09-27 ENCOUNTER — Emergency Department (HOSPITAL_COMMUNITY): Payer: 59

## 2015-09-27 DIAGNOSIS — Y838 Other surgical procedures as the cause of abnormal reaction of the patient, or of later complication, without mention of misadventure at the time of the procedure: Secondary | ICD-10-CM | POA: Insufficient documentation

## 2015-09-27 DIAGNOSIS — L7632 Postprocedural hematoma of skin and subcutaneous tissue following other procedure: Secondary | ICD-10-CM | POA: Diagnosis not present

## 2015-09-27 DIAGNOSIS — R079 Chest pain, unspecified: Secondary | ICD-10-CM

## 2015-09-27 DIAGNOSIS — I25110 Atherosclerotic heart disease of native coronary artery with unstable angina pectoris: Secondary | ICD-10-CM | POA: Diagnosis not present

## 2015-09-27 DIAGNOSIS — E785 Hyperlipidemia, unspecified: Secondary | ICD-10-CM | POA: Insufficient documentation

## 2015-09-27 DIAGNOSIS — I10 Essential (primary) hypertension: Secondary | ICD-10-CM | POA: Diagnosis not present

## 2015-09-27 DIAGNOSIS — Z7982 Long term (current) use of aspirin: Secondary | ICD-10-CM | POA: Diagnosis not present

## 2015-09-27 DIAGNOSIS — K219 Gastro-esophageal reflux disease without esophagitis: Secondary | ICD-10-CM | POA: Insufficient documentation

## 2015-09-27 DIAGNOSIS — G4733 Obstructive sleep apnea (adult) (pediatric): Secondary | ICD-10-CM | POA: Diagnosis present

## 2015-09-27 DIAGNOSIS — E876 Hypokalemia: Secondary | ICD-10-CM | POA: Insufficient documentation

## 2015-09-27 HISTORY — DX: Obstructive sleep apnea (adult) (pediatric): G47.33

## 2015-09-27 HISTORY — DX: Personal history of other diseases of the digestive system: Z87.19

## 2015-09-27 HISTORY — DX: Obstructive sleep apnea (adult) (pediatric): Z99.89

## 2015-09-27 HISTORY — DX: Other forms of angina pectoris: I20.89

## 2015-09-27 HISTORY — DX: Prediabetes: R73.03

## 2015-09-27 HISTORY — DX: Pure hypercholesterolemia, unspecified: E78.00

## 2015-09-27 HISTORY — DX: Other forms of angina pectoris: I20.8

## 2015-09-27 LAB — BASIC METABOLIC PANEL
Anion gap: 12 (ref 5–15)
BUN: 11 mg/dL (ref 6–20)
CO2: 27 mmol/L (ref 22–32)
CREATININE: 1.08 mg/dL (ref 0.61–1.24)
Calcium: 9.1 mg/dL (ref 8.9–10.3)
Chloride: 102 mmol/L (ref 101–111)
GFR calc Af Amer: >60 mL/min (ref >60)
GLUCOSE: 90 mg/dL (ref 65–99)
Potassium: 3.4 mmol/L — ABNORMAL LOW (ref 3.5–5.1)
SODIUM: 141 mmol/L (ref 135–145)

## 2015-09-27 LAB — I-STAT TROPONIN, ED: Troponin i, poc: 0.04 ng/mL (ref 0.00–0.08)

## 2015-09-27 LAB — CBC
HCT: 41.9 % (ref 39.0–52.0)
Hemoglobin: 13.6 g/dL (ref 13.0–17.0)
MCH: 28.7 pg (ref 26.0–34.0)
MCHC: 32.5 g/dL (ref 30.0–36.0)
MCV: 88.4 fL (ref 78.0–100.0)
PLATELETS: 247 10*3/uL (ref 150–400)
RBC: 4.74 MIL/uL (ref 4.22–5.81)
RDW: 13.4 % (ref 11.5–15.5)
WBC: 10.1 10*3/uL (ref 4.0–10.5)

## 2015-09-27 MED ORDER — GI COCKTAIL ~~LOC~~
30.0000 mL | Freq: Once | ORAL | Status: AC
Start: 2015-09-27 — End: 2015-09-27
  Administered 2015-09-27: 30 mL via ORAL
  Filled 2015-09-27: qty 30

## 2015-09-27 MED ORDER — MORPHINE SULFATE (PF) 4 MG/ML IV SOLN
4.0000 mg | Freq: Once | INTRAVENOUS | Status: AC
  Administered 2015-09-27: 4 mg via INTRAVENOUS
  Filled 2015-09-27: qty 1

## 2015-09-27 NOTE — ED Notes (Signed)
PA student at bedside.

## 2015-09-27 NOTE — ED Provider Notes (Signed)
CSN: [id number]     Arrival date & time 09/27/15  1941 History   First MD Initiated Contact with Patient 09/27/15 1957     Chief Complaint  Patient presents with  . Chest Pain     (Consider location/radiation/quality/duration/timing/severity/associated sxs/prior Treatment) HPI Comments: Patient presents to the emergency department with chief complaint of shortness breath and chest pressure. Patient states that he notices symptoms this afternoon when he was ambulating to his office. He states that he became "winded." He states that he does not normally become out of breath when walking to his office. He also reports having a chest pressure sensation, but denies pain. He states that the pain did not radiate to his arm, but does radiate to his jaw. He tried taking 2 doses of nitroglycerin with some relief. Ultimately, he called EMS, after the symptoms do not resolve completely, and was given additional nitroglycerin and aspirin. He states that he is still able to sense some chest pressure and has some shortness of breath. He denies any other symptoms. There are no other modifying factors.  The history is provided by the patient. No language interpreter was used.    Past Medical History  Diagnosis Date  . Sleep apnea     uses cpap  . Hypertension   . GERD (gastroesophageal reflux disease)   . Seasonal allergies   . Arthritis    Past Surgical History  Procedure Laterality Date  . Tonsillectomy    . Fracture surgery  1981    orif l ankle  . Nasal septum surgery  1992  . Lipoma excision  2004    rt neck  . Ankle hardware removal  2006    left   . Uvulopalatopharyngoplasty  2008  . Knee arthroscopy  09/20/2011    Procedure: ARTHROSCOPY KNEE;  Surgeon: Gearlean Alf, MD;  Location: [facility];  Service: Orthopedics;  Laterality: Right;  Medial meniscal DEBRIDEMENT with chondroplasty, right knee  . Knee arthroscopy Left 04/07/2013    Procedure: LEFT KNEE ARTHROSCOPY WITH  MEDIAL MENISCUS  DEBRIDEMENT ;  Surgeon: Gearlean Alf, MD;  Location: [facility];  Service: Orthopedics;  Laterality: Left;   Family History  Problem Relation Age of Onset  . Hypertension Mother    Social History  Substance Use Topics  . Smoking status: Never Smoker   . Smokeless tobacco: Former Systems developer    Quit date: 09/16/2001  . Alcohol Use: Yes     Comment: socially    Review of Systems  Constitutional: Negative for fever and chills.  Respiratory: Positive for shortness of breath.   Cardiovascular: Positive for chest pain.  Gastrointestinal: Negative for nausea, vomiting, diarrhea and constipation.  Genitourinary: Negative for dysuria.  All other systems reviewed and are negative.     Allergies  Oxycodone-acetaminophen  Home Medications   Prior to Admission medications   Medication Sig Start Date End Date Taking? Authorizing Provider  aspirin EC 81 MG EC tablet Take 1 tablet (81 mg total) by mouth daily. 09/22/15   Shanker Kristeen Mans, MD  atorvastatin (LIPITOR) 20 MG tablet Take 1 tablet (20 mg total) by mouth daily at 6 PM. 09/22/15   Jonetta Osgood, MD  carvedilol (COREG) 6.25 MG tablet Take 1 tablet (6.25 mg total) by mouth 2 (two) times daily with a meal. 09/22/15   Shanker Kristeen Mans, MD  nitroGLYCERIN (NITROSTAT) 0.4 MG SL tablet Place 1 tablet (0.4 mg total) under the tongue every 5 (five) minutes as needed for  chest pain. 09/22/15   Shanker Kristeen Mans, MD  ranitidine (ZANTAC) 150 MG tablet Take 1 tablet (150 mg total) by mouth 2 (two) times daily. Reported on 09/20/2015 09/22/15   Jonetta Osgood, MD   BP 113/75 mmHg  Pulse 56  Temp(Src) 98.3 F (36.8 C)  Resp 10  SpO2 100% Physical Exam  Constitutional: He is oriented to person, place, and time. He appears well-developed and well-nourished.  HENT:  Head: Normocephalic and atraumatic.  Eyes: Conjunctivae and EOM are normal. Pupils are equal, round, and reactive to light. Right eye exhibits no discharge. Left eye  exhibits no discharge. No scleral icterus.  Neck: Normal range of motion. Neck supple. No JVD present.  Cardiovascular: Normal rate, regular rhythm and normal heart sounds.  Exam reveals no gallop and no friction rub.   No murmur heard. Pulmonary/Chest: Effort normal and breath sounds normal. No respiratory distress. He has no wheezes. He has no rales. He exhibits no tenderness.  Abdominal: Soft. He exhibits no distension and no mass. There is no tenderness. There is no rebound and no guarding.  Musculoskeletal: Normal range of motion. He exhibits no edema or tenderness.  Neurological: He is alert and oriented to person, place, and time.  Skin: Skin is warm and dry.  Psychiatric: He has a normal mood and affect. His behavior is normal. Judgment and thought content normal.  Nursing note and vitals reviewed.   ED Course  Procedures (including critical care time) Labs Review Labs Reviewed  BASIC METABOLIC PANEL - Abnormal; Notable for the following:    Potassium 3.4 (*)    All other components within normal limits  CBC  I-STAT TROPOININ, ED  Randolm Idol, ED    Imaging Review Dg Chest 2 View  09/27/2015  CLINICAL DATA:  Chest pressure with shortness of breath onset today. EXAM: CHEST  2 VIEW COMPARISON:  Chest x-rays dated 09/20/2015 and 04/01/2013. FINDINGS: Mild cardiomegaly is stable. Overall cardiomediastinal silhouette is stable in size and configuration. There is a chronic mild interstitial prominence which is also stable. Lungs otherwise clear. No evidence of pneumonia. No pleural effusion. No pneumothorax seen. Osseous structures about the chest are unremarkable. IMPRESSION: No active cardiopulmonary disease. Electronically Signed   By: Franki Cabot M.D.   On: 09/27/2015 20:39   I have personally reviewed and evaluated these images and lab results as part of my medical decision-making.   EKG Interpretation   Date/Time:  Wednesday September 27 2015 20:05:30 EDT Ventricular  Rate:  60 PR Interval:  168 QRS Duration: 110 QT Interval:  433 QTC Calculation: 433 R Axis:   2 Text Interpretation:  Sinus rhythm LVH with IVCD and secondary repol abnrm  improvement in ST changes from 09/21/15 Confirmed by [facility] MD, Corene Cornea  [phone]) on 09/27/2015 8:18:04 PM      MDM   Final diagnoses:  Chest pain, unspecified chest pain type    Patient with chest pressure and shortness of breath. Onset this afternoon when walking to work. Some improved after taking nitroglycerin. Will treat patient with morphine. He has received aspirin and nitroglycerin already. Will check chest x-ray, and labs.  EKG shows improvement of ST segments when compared to EKG from 3/9.  10:13 PM Patient discussed with Dr. Radford Pax of cardiology, who recommends GI cocktail, delta troponin, and call her back after delta troponin.  Delta troponin reordered at midnight.  Patient discussed again with Dr. Radford Pax, who recommends hospitalist admission as patient is chest pain free.  Appreciate Dr. Hal Hope  for admitting the patient.    Montine Circle, PA-C 09/28/15 [id number]  Merrily Pew, MD 09/28/15 [phone]

## 2015-09-27 NOTE — ED Notes (Signed)
Pt has seen Dr. [name], no scheduled cath yet, suppose to have u/s next week.

## 2015-09-27 NOTE — ED Notes (Addendum)
Pt arrived via EMS from home. Pt was out shopping today, onset of cp, took nitro x 1, mild relief. Pt was seen here for the same about a week ago, suppose to f/u with cardiology for cath. 116/73, hr 64, resp 16, 96% room air, cbg 73, iv established. Pt was given 4 asa, and 3 nitro by ems, pain now 4/10

## 2015-09-28 ENCOUNTER — Encounter (HOSPITAL_COMMUNITY)
Admission: EM | Disposition: A | Discharge status: Home / Self Care | Payer: Self-pay | Source: Home / Self Care | Attending: Emergency Medicine

## 2015-09-28 ENCOUNTER — Encounter (HOSPITAL_COMMUNITY): Payer: Self-pay | Admitting: Internal Medicine

## 2015-09-28 DIAGNOSIS — R9431 Abnormal electrocardiogram [ECG] [EKG]: Secondary | ICD-10-CM

## 2015-09-28 DIAGNOSIS — K219 Gastro-esophageal reflux disease without esophagitis: Secondary | ICD-10-CM | POA: Diagnosis not present

## 2015-09-28 DIAGNOSIS — R079 Chest pain, unspecified: Secondary | ICD-10-CM | POA: Diagnosis not present

## 2015-09-28 DIAGNOSIS — R072 Precordial pain: Secondary | ICD-10-CM | POA: Diagnosis not present

## 2015-09-28 DIAGNOSIS — G4733 Obstructive sleep apnea (adult) (pediatric): Secondary | ICD-10-CM | POA: Diagnosis not present

## 2015-09-28 DIAGNOSIS — I10 Essential (primary) hypertension: Secondary | ICD-10-CM

## 2015-09-28 DIAGNOSIS — I209 Angina pectoris, unspecified: Secondary | ICD-10-CM

## 2015-09-28 DIAGNOSIS — E785 Hyperlipidemia, unspecified: Secondary | ICD-10-CM | POA: Diagnosis not present

## 2015-09-28 DIAGNOSIS — I25110 Atherosclerotic heart disease of native coronary artery with unstable angina pectoris: Secondary | ICD-10-CM | POA: Diagnosis not present

## 2015-09-28 DIAGNOSIS — I200 Unstable angina: Secondary | ICD-10-CM

## 2015-09-28 DIAGNOSIS — I517 Cardiomegaly: Secondary | ICD-10-CM | POA: Diagnosis not present

## 2015-09-28 HISTORY — PX: CARDIAC CATHETERIZATION: SHX172

## 2015-09-28 LAB — CBC
HEMATOCRIT: 41.8 % (ref 39.0–52.0)
HEMOGLOBIN: 13.5 g/dL (ref 13.0–17.0)
MCH: 28.7 pg (ref 26.0–34.0)
MCHC: 32.3 g/dL (ref 30.0–36.0)
MCV: 88.9 fL (ref 78.0–100.0)
Platelets: 231 10*3/uL (ref 150–400)
RBC: 4.7 MIL/uL (ref 4.22–5.81)
RDW: 13.5 % (ref 11.5–15.5)
WBC: 9 10*3/uL (ref 4.0–10.5)

## 2015-09-28 LAB — CREATININE, SERUM
CREATININE: 1.17 mg/dL (ref 0.61–1.24)
GFR calc Af Amer: >60 mL/min (ref >60)

## 2015-09-28 LAB — TROPONIN I
TROPONIN I: 0.03 ng/mL (ref <0.031)
TROPONIN I: 0.03 ng/mL (ref <0.031)
Troponin I: 0.03 ng/mL (ref <0.031)

## 2015-09-28 LAB — I-STAT TROPONIN, ED: Troponin i, poc: 0.02 ng/mL (ref 0.00–0.08)

## 2015-09-28 LAB — D-DIMER, QUANTITATIVE (NOT AT ARMC)

## 2015-09-28 SURGERY — LEFT HEART CATH AND CORONARY ANGIOGRAPHY

## 2015-09-28 MED ORDER — ENOXAPARIN SODIUM 40 MG/0.4ML ~~LOC~~ SOLN
40.0000 mg | SUBCUTANEOUS | Status: DC
  Administered 2015-09-28: 40 mg via SUBCUTANEOUS
  Filled 2015-09-28 (×2): qty 0.4

## 2015-09-28 MED ORDER — FAMOTIDINE 20 MG PO TABS
20.0000 mg | ORAL_TABLET | Freq: Two times a day (BID) | ORAL | Status: DC
  Administered 2015-09-28 – 2015-09-29 (×4): 20 mg via ORAL
  Filled 2015-09-28 (×4): qty 1

## 2015-09-28 MED ORDER — NITROGLYCERIN 0.4 MG SL SUBL
SUBLINGUAL_TABLET | SUBLINGUAL | Status: AC
  Filled 2015-09-28: qty 1

## 2015-09-28 MED ORDER — SODIUM CHLORIDE 0.9 % IV SOLN: INTRAVENOUS | Status: DC

## 2015-09-28 MED ORDER — MIDAZOLAM HCL 2 MG/2ML IJ SOLN
INTRAMUSCULAR | Status: DC | PRN
  Administered 2015-09-28: 1 mg via INTRAVENOUS

## 2015-09-28 MED ORDER — LIDOCAINE HCL (PF) 1 % IJ SOLN
INTRAMUSCULAR | Status: AC
  Filled 2015-09-28: qty 30

## 2015-09-28 MED ORDER — SODIUM CHLORIDE 0.9 % IV SOLN: 250.0000 mL | INTRAVENOUS | Status: DC | PRN

## 2015-09-28 MED ORDER — VERAPAMIL HCL 2.5 MG/ML IV SOLN
INTRAVENOUS | Status: AC
  Filled 2015-09-28: qty 2

## 2015-09-28 MED ORDER — SODIUM CHLORIDE 0.9% FLUSH: 3.0000 mL | INTRAVENOUS | Status: DC | PRN

## 2015-09-28 MED ORDER — LIDOCAINE HCL (PF) 1 % IJ SOLN
INTRAMUSCULAR | Status: DC | PRN
  Administered 2015-09-28: 15:00:00

## 2015-09-28 MED ORDER — ATORVASTATIN CALCIUM 20 MG PO TABS
20.0000 mg | ORAL_TABLET | Freq: Every day | ORAL | Status: DC
  Administered 2015-09-28: 20 mg via ORAL
  Filled 2015-09-28: qty 1

## 2015-09-28 MED ORDER — HEPARIN (PORCINE) IN NACL 2-0.9 UNIT/ML-% IJ SOLN
INTRAMUSCULAR | Status: AC
  Filled 2015-09-28: qty 1500

## 2015-09-28 MED ORDER — POTASSIUM CHLORIDE CRYS ER 20 MEQ PO TBCR
40.0000 meq | EXTENDED_RELEASE_TABLET | Freq: Once | ORAL | Status: AC
Start: 2015-09-28 — End: 2015-09-28
  Administered 2015-09-28: 40 meq via ORAL
  Filled 2015-09-28: qty 2

## 2015-09-28 MED ORDER — LOSARTAN POTASSIUM 50 MG PO TABS: 50.0000 mg | ORAL_TABLET | Freq: Every day | ORAL | Status: DC

## 2015-09-28 MED ORDER — HEPARIN SODIUM (PORCINE) 1000 UNIT/ML IJ SOLN
INTRAMUSCULAR | Status: AC
  Filled 2015-09-28: qty 1

## 2015-09-28 MED ORDER — NITROGLYCERIN 0.4 MG SL SUBL: 0.4000 mg | SUBLINGUAL_TABLET | SUBLINGUAL | Status: DC | PRN

## 2015-09-28 MED ORDER — LIDOCAINE HCL (PF) 1 % IJ SOLN
INTRAMUSCULAR | Status: DC | PRN
  Administered 2015-09-28: 4 mL

## 2015-09-28 MED ORDER — HEPARIN SODIUM (PORCINE) 1000 UNIT/ML IJ SOLN
INTRAMUSCULAR | Status: DC | PRN
  Administered 2015-09-28: 5000 [IU] via INTRAVENOUS

## 2015-09-28 MED ORDER — CARVEDILOL 6.25 MG PO TABS
6.2500 mg | ORAL_TABLET | Freq: Two times a day (BID) | ORAL | Status: DC
  Administered 2015-09-28 – 2015-09-29 (×3): 6.25 mg via ORAL
  Filled 2015-09-28 (×3): qty 1

## 2015-09-28 MED ORDER — ASPIRIN EC 81 MG PO TBEC: 81.0000 mg | DELAYED_RELEASE_TABLET | Freq: Every day | ORAL | Status: DC

## 2015-09-28 MED ORDER — ASPIRIN 81 MG PO CHEW: 81.0000 mg | CHEWABLE_TABLET | ORAL | Status: AC

## 2015-09-28 MED ORDER — IOHEXOL 350 MG/ML SOLN
INTRAVENOUS | Status: DC | PRN
  Administered 2015-09-28: 80 mL via INTRA_ARTERIAL

## 2015-09-28 MED ORDER — VERAPAMIL HCL 2.5 MG/ML IV SOLN
INTRAVENOUS | Status: DC | PRN
  Administered 2015-09-28: 10 mL via INTRA_ARTERIAL

## 2015-09-28 MED ORDER — FENTANYL CITRATE (PF) 100 MCG/2ML IJ SOLN
INTRAMUSCULAR | Status: DC | PRN
  Administered 2015-09-28: 50 ug via INTRAVENOUS

## 2015-09-28 MED ORDER — NITROGLYCERIN 0.4 MG SL SUBL
0.4000 mg | SUBLINGUAL_TABLET | SUBLINGUAL | Status: DC | PRN
  Administered 2015-09-28 (×5): 0.4 mg via SUBLINGUAL
  Filled 2015-09-28: qty 1

## 2015-09-28 MED ORDER — ONDANSETRON HCL 4 MG/2ML IJ SOLN
4.0000 mg | Freq: Four times a day (QID) | INTRAMUSCULAR | Status: DC | PRN
  Administered 2015-09-28: 4 mg via INTRAVENOUS
  Filled 2015-09-28: qty 2

## 2015-09-28 MED ORDER — PANTOPRAZOLE SODIUM 40 MG PO TBEC: 40.0000 mg | DELAYED_RELEASE_TABLET | Freq: Every day | ORAL | Status: DC

## 2015-09-28 MED ORDER — LOSARTAN POTASSIUM 50 MG PO TABS
50.0000 mg | ORAL_TABLET | Freq: Every day | ORAL | Status: DC
  Administered 2015-09-28 – 2015-09-29 (×2): 50 mg via ORAL
  Filled 2015-09-28 (×2): qty 1

## 2015-09-28 MED ORDER — SODIUM CHLORIDE 0.9% FLUSH: 3.0000 mL | Freq: Two times a day (BID) | INTRAVENOUS | Status: DC

## 2015-09-28 MED ORDER — SODIUM CHLORIDE 0.9% FLUSH
3.0000 mL | Freq: Two times a day (BID) | INTRAVENOUS | Status: DC
  Administered 2015-09-28: 3 mL via INTRAVENOUS

## 2015-09-28 MED ORDER — MIDAZOLAM HCL 2 MG/2ML IJ SOLN
INTRAMUSCULAR | Status: AC
  Filled 2015-09-28: qty 2

## 2015-09-28 MED ORDER — MORPHINE SULFATE (PF) 2 MG/ML IV SOLN
1.0000 mg | INTRAVENOUS | Status: DC | PRN
  Administered 2015-09-28 – 2015-09-29 (×2): 1 mg via INTRAVENOUS
  Filled 2015-09-28 (×2): qty 1

## 2015-09-28 MED ORDER — ZOLPIDEM TARTRATE 5 MG PO TABS: 10.0000 mg | ORAL_TABLET | Freq: Every evening | ORAL | Status: DC | PRN

## 2015-09-28 MED ORDER — ASPIRIN EC 325 MG PO TBEC
325.0000 mg | DELAYED_RELEASE_TABLET | Freq: Every day | ORAL | Status: DC
  Administered 2015-09-28 – 2015-09-29 (×2): 325 mg via ORAL
  Filled 2015-09-28 (×2): qty 1

## 2015-09-28 MED ORDER — FENTANYL CITRATE (PF) 100 MCG/2ML IJ SOLN
INTRAMUSCULAR | Status: AC
  Filled 2015-09-28: qty 2

## 2015-09-28 SURGICAL SUPPLY — 10 items
CATH INFINITI 5FR ANG PIGTAIL (CATHETERS) ×3 IMPLANT
CATH OPTITORQUE SARAH 4.5 5F (CATHETERS) ×3 IMPLANT
DEVICE RAD COMP TR BAND LRG (VASCULAR PRODUCTS) ×3 IMPLANT
GLIDESHEATH SLEND SS 6F .021 (SHEATH) ×3 IMPLANT
KIT HEART LEFT (KITS) ×3 IMPLANT
PACK CARDIAC CATHETERIZATION (CUSTOM PROCEDURE TRAY) ×3 IMPLANT
SYR MEDRAD MARK V 150ML (SYRINGE) ×3 IMPLANT
TRANSDUCER W/STOPCOCK (MISCELLANEOUS) ×3 IMPLANT
TUBING CIL FLEX 10 FLL-RA (TUBING) ×3 IMPLANT
WIRE SAFE-T 1.5MM-J .035X260CM (WIRE) ×3 IMPLANT

## 2015-09-28 NOTE — Progress Notes (Signed)
[facility]                                                                              Patient Demographics  Joshua Stanton, is a 55 y.o. male, DOB - 11-01-1960, [id number]  Admit date - 09/27/2015   Admitting Physician Rise Patience, MD  Outpatient Primary MD for the patient is [name], MD  LOS -   days    Chief Complaint  Patient presents with  . Chest Pain       Brief HPI   Patient is a 55 year old male with hypertension, hyperlipidemia, GERD, OSA on CPAP presented with chest pain. Patient was recently admitted last week for atypical angina and had new T-wave inversions on EKG. Patient was evaluated by cardiology and recommended cardiac catheterization however patient had refused. Patient underwent nuclear stress test which was deemed as low risk and discharged on aspirin, beta blocker and statin. Patient returned back with the chest discomfort, exertional, associated with shortness of breath, dizziness. Patient admitted for further workup.   Assessment & Plan    Principal Problem:   Chest pain typical, exertional, concerning for angina: - Currently chest pain resolved -  troponins negative so far, EKG with T-wave inversions in the lateral leads -Called cardiology consult, planning cardiac catheterization today - Continue aspirin, statin, beta blocker - Follow hemoglobin A1c, echocardiogram - D-dimer normal  Active Problems:   Essential hypertension - BP currently stable, continue beta blocker  Hyperlipidemia - Lipid panel on 3/9 showed LDL 125, patient was placed on Lipitor, will continue    Obstructive sleep apnea - On CPAP  Code Status: Full CODE STATUS  Family Communication: Discussed in detail with the patient, all imaging results, lab results explained to the patient    Disposition Plan: Pending cardiac cath  Time Spent in minutes   25 minutes  Procedures  None   Consults   cardiology  DVT Prophylaxis   Lovenox   Medications  Scheduled Meds: . aspirin EC  325 mg Oral Daily  . atorvastatin  20 mg Oral q1800  . carvedilol  6.25 mg Oral BID WC  . enoxaparin (LOVENOX) injection  40 mg Subcutaneous Q24H  . famotidine  20 mg Oral BID  . potassium chloride  40 mEq Oral Once   Continuous Infusions:  PRN Meds:.morphine injection, nitroGLYCERIN, ondansetron (ZOFRAN) IV, zolpidem   Antibiotics   Anti-infectives    None        Subjective:   [name] was seen and examined today. Patient denies dizziness, shortness of breath, abdominal pain, N/V/D/C, new weakness, numbess, tingling. No acute events overnight.  Currently chest pain resolved.  Objective:   Filed Vitals:   09/28/15 0100 09/28/15 0130 09/28/15 0153 09/28/15 0503  BP: 138/79 139/73 152/84 123/70  Pulse: 72 64 58 65  Temp:   97.5 F (36.4 C)   TempSrc:   Oral   Resp: 16 21 15 14   Weight:   121.428 kg (267 lb 11.2 oz)   SpO2: 98% 94% 94% 100%   No intake or output data in the 24 hours ending 09/28/15 1019   Wt Readings from Last  3 Encounters:  09/28/15 121.428 kg (267 lb 11.2 oz)  04/01/13 125.646 kg (277 lb)  09/17/11 120.203 kg (265 lb)     Exam  General: Alert and oriented x 3, NAD  HEENT:  PERRLA, EOMI, Anicteric Sclera, mucous membranes moist.   Neck: Supple, no JVD, no masses  CVS: S1 S2 auscultated, no rubs, murmurs or gallops. Regular rate and rhythm.  Respiratory: Clear to auscultation bilaterally, no wheezing, rales or rhonchi  Abdomen: Soft, nontender, nondistended, + bowel sounds  Ext: no cyanosis clubbing or edema  Neuro: AAOx3, Cr N's II- XII. Strength 5/5 upper and lower extremities bilaterally  Skin: No rashes  Psych: Normal affect and demeanor, alert and oriented x3    Data Reviewed:  I have personally reviewed following labs and imaging studies  Micro Results Recent Results (from the past 240 hour(s))  MRSA PCR Screening     Status: None   Collection Time:  09/20/15 11:50 PM  Result Value Ref Range Status   MRSA by PCR NEGATIVE NEGATIVE Final    Comment:        The GeneXpert MRSA Assay (FDA approved for NASAL specimens only), is one component of a comprehensive MRSA colonization surveillance program. It is not intended to diagnose MRSA infection nor to guide or monitor treatment for MRSA infections.     Radiology Reports Dg Chest 2 View  09/27/2015  CLINICAL DATA:  Chest pressure with shortness of breath onset today. EXAM: CHEST  2 VIEW COMPARISON:  Chest x-rays dated 09/20/2015 and 04/01/2013. FINDINGS: Mild cardiomegaly is stable. Overall cardiomediastinal silhouette is stable in size and configuration. There is a chronic mild interstitial prominence which is also stable. Lungs otherwise clear. No evidence of pneumonia. No pleural effusion. No pneumothorax seen. Osseous structures about the chest are unremarkable. IMPRESSION: No active cardiopulmonary disease. Electronically Signed   By: Franki Cabot M.D.   On: 09/27/2015 20:39   Dg Chest 2 View  09/20/2015  CLINICAL DATA:  Chest pain since 0900 today EXAM: CHEST  2 VIEW COMPARISON:  04/01/2013 and multiple prior studies FINDINGS: Heart size upper normal. Vascular pattern normal. Lungs clear. No pleural effusion. IMPRESSION: No active cardiopulmonary disease. Electronically Signed   By: Skipper Cliche M.D.   On: 09/20/2015 19:45   Nm Myocar Multi W/spect W/wall Motion / Ef  09/22/2015   T wave inversion was noted during stress.  This is a low risk study.  The left ventricular ejection fraction is moderately decreased (30-44%).  Nuclear stress EF: 44%.  1. Fixed, small mild basal to mid inferolateral defect.  Possible attenuation.  No ischemia. 2. EF 44% with diffuse hypokinesis. 3. Overall low risk study.    CBC  Recent Labs Lab 09/22/15 0302 09/27/15 2120 09/28/15 0216  WBC 7.4 10.1 9.0  HGB 14.0 13.6 13.5  HCT 43.4 41.9 41.8  PLT 234 247 231  MCV 88.6 88.4 88.9  MCH 28.6  28.7 28.7  MCHC 32.3 32.5 32.3  RDW 13.7 13.4 13.5    Chemistries   Recent Labs Lab 09/22/15 0302 09/27/15 2120 09/28/15 0216  NA 141 141  --   K 3.3* 3.4*  --   CL 104 102  --   CO2 27 27  --   GLUCOSE 112* 90  --   BUN 11 11  --   CREATININE 1.09 1.08 1.17  CALCIUM 8.9 9.1  --    ------------------------------------------------------------------------------------------------------------------ CrCl cannot be calculated (Unknown ideal weight.). ------------------------------------------------------------------------------------------------------------------ No results for input(s): HGBA1C in the  last 72 hours. ------------------------------------------------------------------------------------------------------------------ No results for input(s): CHOL, HDL, LDLCALC, TRIG, CHOLHDL, LDLDIRECT in the last 72 hours. ------------------------------------------------------------------------------------------------------------------ No results for input(s): TSH, T4TOTAL, T3FREE, THYROIDAB in the last 72 hours.  Invalid input(s): FREET3 ------------------------------------------------------------------------------------------------------------------ No results for input(s): VITAMINB12, FOLATE, FERRITIN, TIBC, IRON, RETICCTPCT in the last 72 hours.  Coagulation profile  Recent Labs Lab 09/22/15 0302  INR 1.03     Recent Labs  09/28/15 0744  DDIMER <0.27    Cardiac Enzymes  Recent Labs Lab 09/28/15 0216 09/28/15 0745  TROPONINI 0.03 0.03   ------------------------------------------------------------------------------------------------------------------ Invalid input(s): POCBNP  No results for input(s): GLUCAP in the last 72 hours.   Humbert Morozov M.D. [facility] 09/28/2015, 10:19 AM  Pager: [phone] Between 7am to 7pm - call Pager - [phone]  After 7pm go to [email or web address] - password [email or web address]  Call night coverage person covering after 7pm

## 2015-09-28 NOTE — ED Notes (Signed)
Pt to be NPO after 4am per admitting physician

## 2015-09-28 NOTE — Interval H&P Note (Signed)
History and Physical Interval Note:  09/28/2015 2:10 PM  Joshua Stanton  has presented today for surgery, with the diagnosis of unstable angina  The various methods of treatment have been discussed with the patient and family. After consideration of risks, benefits and other options for treatment, the patient has consented to  Procedure(s): Left Heart Cath and Coronary Angiography (N/A) as a surgical intervention .  The patient's history has been reviewed, patient examined, no change in status, stable for surgery.  I have reviewed the patient's chart and labs.  Questions were answered to the patient's satisfaction.     Kathlyn Sacramento

## 2015-09-28 NOTE — Progress Notes (Addendum)
Pt arrived to unit. VSS. Pt complaining of 1/10 chest pressure. MD paged. No new orders given. Will continue to monitor closely.  Kate Larock Ronne Binning

## 2015-09-28 NOTE — Consult Note (Signed)
CARDIOLOGY CONSULT NOTE   Patient ID: Joshua Stanton MRN: [id number] DOB/AGE: 1960/12/14 55 y.o.  Admit date: 09/27/2015  Primary Physician   [name], MD Primary Cardiologist   Dr. [name] Reason for Consultation   Chest pain Requesting Physician  Dr. Tana Coast  HPI: Joshua Stanton is a 55 y.o. male with a history of HTN, HL, GERD and sleep apnea on CPAP who presented with CP.   Admitted 3/8-3/10 for atypical anginal pain and new T-wave inversion. The patient was seen by Dr. [name] and recommended cardiac catheterization for definite evaluation however patient refused. She currently nuclear stress was low risks and discharged on aspirin/statin and beta blocker.  Since discharge he was doing well up until yesterday 3/15 when he had a few episodes of exertional chest pain. He took off from work Monday and Tuesday and went to work yesterday where he had a exertional chest pain associated with shortness of breath. He described the pain as a substernal chest pressure. His pain initially resolved with sublingual nitroglycerin. 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 His pain resolved with morphine. He did have radiation of pain to his left jaw. Denied nausea, vomiting or diaphoresis. The patient denies lower extremity edema, orthopnea, PND, syncope, blood in his stool or urine, dizziness, palpitation.  EKG shows sinus rhythm with improvement in ST changes compared to prior EKG of 09/21/15, especially in lead V5 and V6. Troponin negative. Chest x-ray without acute disease. D-dimer negative. He is currently chest pain-free.  The patient is scheduled to have a echocardiogram 10/04/15 as outpatient.   Past Medical History  Diagnosis Date  . Sleep apnea     uses cpap  . Hypertension   . GERD (gastroesophageal reflux disease)   . Seasonal allergies   .  Arthritis      Past Surgical History  Procedure Laterality Date  . Tonsillectomy    . Fracture surgery  1981    orif l ankle  . Nasal septum surgery  1992  . Lipoma excision  2004    rt neck  . Ankle hardware removal  2006    left   . Uvulopalatopharyngoplasty  2008  . Knee arthroscopy  09/20/2011    Procedure: ARTHROSCOPY KNEE;  Surgeon: Gearlean Alf, MD;  Location: [facility];  Service: Orthopedics;  Laterality: Right;  Medial meniscal DEBRIDEMENT with chondroplasty, right knee  . Knee arthroscopy Left 04/07/2013    Procedure: LEFT KNEE ARTHROSCOPY WITH MEDIAL MENISCUS  DEBRIDEMENT ;  Surgeon: Gearlean Alf, MD;  Location: [facility];  Service: Orthopedics;  Laterality: Left;    Allergies  Allergen Reactions  . Oxycodone-Acetaminophen Other (See Comments)    Nausea    I have reviewed the patient's current medications . aspirin EC  325 mg Oral Daily  . atorvastatin  20 mg Oral q1800  . carvedilol  6.25 mg Oral BID WC  . enoxaparin (LOVENOX) injection  40 mg Subcutaneous Q24H  . famotidine  20 mg Oral BID  . potassium chloride  40 mEq Oral Once     morphine injection, nitroGLYCERIN, ondansetron (ZOFRAN) IV, zolpidem  Prior to Admission medications   Medication Sig Start Date End Date Taking? Authorizing Provider  aspirin EC 81 MG EC tablet Take 1 tablet (81 mg total) by mouth daily. 09/22/15  Yes Shanker Kristeen Mans, MD  atorvastatin (LIPITOR) 20 MG tablet Take  1 tablet (20 mg total) by mouth daily at 6 PM. 09/22/15  Yes Shanker Kristeen Mans, MD  carvedilol (COREG) 6.25 MG tablet Take 1 tablet (6.25 mg total) by mouth 2 (two) times daily with a meal. 09/22/15  Yes Shanker Kristeen Mans, MD  ranitidine (ZANTAC) 150 MG tablet Take 1 tablet (150 mg total) by mouth 2 (two) times daily. Reported on 09/20/2015 09/22/15  Yes Shanker Kristeen Mans, MD  TESTIM 50 MG/5GM (1%) GEL Apply 5 g topically daily. 08/30/15  Yes Historical Provider, MD  zolpidem (AMBIEN) 10 MG tablet Take 10 mg by  mouth at bedtime as needed for sleep.  08/30/15  Yes Historical Provider, MD  nitroGLYCERIN (NITROSTAT) 0.4 MG SL tablet Place 1 tablet (0.4 mg total) under the tongue every 5 (five) minutes as needed for chest pain. Patient not taking: Reported on 09/27/2015 09/22/15   Jonetta Osgood, MD     Social History   Social History  . Marital Status: Married    Spouse Name: N/A  . Number of Children: N/A  . Years of Education: N/A   Occupational History  . Not on file.   Social History Main Topics  . Smoking status: Never Smoker   . Smokeless tobacco: Former Systems developer    Quit date: 09/16/2001  . Alcohol Use: Yes     Comment: socially  . Drug Use: Yes    Special: Marijuana     Comment: 20 years ago  . Sexual Activity: Not on file   Other Topics Concern  . Not on file   Social History Narrative    No family status information on file.   Family History  Problem Relation Age of Onset  . Hypertension Mother       ROS:  Full 14 point review of systems complete and found to be negative unless listed above.  Physical Exam: Blood pressure 123/70, pulse 65, temperature 97.5 F (36.4 C), temperature source Oral, resp. rate 14, weight 267 lb 11.2 oz (121.428 kg), SpO2 100 %.  General: Well developed, well nourished, male in no acute distress Head: Eyes PERRLA, No xanthomas. Normocephalic and atraumatic, oropharynx without edema or exudate.  Lungs: Resp regular and unlabored, CTA. Heart: RRR no s3, s4, or murmurs..   Neck: No carotid bruits. No lymphadenopathy.  JVD. Abdomen: Bowel sounds present, abdomen soft and non-tender without masses or hernias noted. Msk:  No spine or cva tenderness. No weakness, no joint deformities or effusions. Extremities: No clubbing, cyanosis or edema. DP/PT/Radials 2+ and equal bilaterally. Neuro: Alert and oriented X 3. No focal deficits noted. Psych:  Good affect, responds appropriately Skin: No rashes or lesions noted.  Labs:   Lab Results    Component Value Date   WBC 9.0 09/28/2015   HGB 13.5 09/28/2015   HCT 41.8 09/28/2015   MCV 88.9 09/28/2015   PLT 231 09/28/2015   No results for input(s): INR in the last 72 hours.  Recent Labs Lab 09/27/15 2120 09/28/15 0216  NA 141  --   K 3.4*  --   CL 102  --   CO2 27  --   BUN 11  --   CREATININE 1.08 1.17  CALCIUM 9.1  --   GLUCOSE 90  --    MAGNESIUM  Date Value Ref Range Status  09/21/2015 2.1 1.7 - 2.4 mg/dL Final    Recent Labs  09/28/15 0216 09/28/15 0745  TROPONINI 0.03 0.03    Recent Labs  09/27/15 2102 09/28/15 0033  TROPIPOC 0.04 0.02   No results found for: PROBNP Lab Results  Component Value Date   CHOL 184 09/21/2015   HDL 38* 09/21/2015   LDLCALC 125* 09/21/2015   TRIG 106 09/21/2015   Lab Results  Component Value Date   DDIMER <0.27 09/28/2015   No results found for: LIPASE, AMYLASE No results found for: TSH, T4TOTAL, T3FREE, THYROIDAB No results found for: VITAMINB12, FOLATE, FERRITIN, TIBC, IRON, RETICCTPCT  Echo:   ECG:   Vent. rate 60 BPM PR interval 168 ms QRS duration 110 ms QT/QTc 433/433 ms P-R-T axes 12 2 148  Radiology:  Dg Chest 2 View  09/27/2015  CLINICAL DATA:  Chest pressure with shortness of breath onset today. EXAM: CHEST  2 VIEW COMPARISON:  Chest x-rays dated 09/20/2015 and 04/01/2013. FINDINGS: Mild cardiomegaly is stable. Overall cardiomediastinal silhouette is stable in size and configuration. There is a chronic mild interstitial prominence which is also stable. Lungs otherwise clear. No evidence of pneumonia. No pleural effusion. No pneumothorax seen. Osseous structures about the chest are unremarkable. IMPRESSION: No active cardiopulmonary disease. Electronically Signed   By: Franki Cabot M.D.   On: 09/27/2015 20:39    ASSESSMENT AND PLAN:     1. Chest pain - Concerning for anginal pain. Responsive to sublingual nitroglycerin however reoccurred pain with exertion. Troponin negative. EKG shows  improvement in ST segment compared to prior EKG. Currently chest pain-free. - He is scheduled to have echocardiogram as an outpatient--> will do during this admission.  - For cath today. The patient understands that risks include but are not limited to stroke (1 in 1000), death (1 in 58), kidney failure [usually temporary] (1 in 500), bleeding (1 in 200), allergic reaction [possibly serious] (1 in 200), and agrees to proceed.  - Continue his aspirin and statin and beta blocker. - If develops chest pain start nitroglycerin paste and heparin.  2. HL -09/21/2015: Cholesterol 184; HDL 38*; LDL Cholesterol 125*; Triglycerides 106; VLDL 21  - Continue statin  3. Obstructive sleep apnea - On CPAP  4. HTN - Relatively stable. Continue BB.   5. Hypokalemia - Will give suppliment  Signed: Bhagat,Bhavinkumar, PA 09/28/2015, 9:45 AM Pager [phone]  Co-Sign MD  A 55 y.o. male with a history of HTN, HL, GERD and sleep apnea on CPAP who presented with CP, he is a patient of Dr [name], recently admitted for similar complain and had an ECG suspicious for ischemia, cardiac cath was recommended but the patient denied.  He is coming back with worsening chest pains. We will schedule for a cardiac cath today.  ECG still shows SR, LVH and possible ischemia in the anterolateral leads.  Continue ASA, atorvastatin increase to 40 mg po daily, continue carvedilol 6.25 mg po BID.  Dorothy Spark 09/28/2015

## 2015-09-28 NOTE — H&P (Signed)
[name] History and Physical  [name] [id number] DOB: 06-03-1961 DOA: 09/27/2015  Referring physician: Alden Server.PA. PCP: [name], MD  Specialists: None.  Chief Complaint: Chest pain.  HPI: Joshua Stanton is a 55 y.o. male history of hypertension sleep apnea and hyperlipidemia presents to the ER because of chest pressure. Patient's symptoms started yesterday afternoon. Patient had mild associated shortness of breath. Patient did take some sublingual nitroglycerin which did not relieve the pressure. EMS was called and patient was brought to the ER following morphine patient's symptoms improved. Presently patient is chest pain-free. EKG shows ST changes comparable to the old EKG. Troponin is negative. Patient during the last admission last week has stress test which was low risk study and EF was around 45%. At the time cardiologist as recommended intervention if patient has chest pain recurrence.   Review of Systems: As presented in the history of presenting illness, rest negative.  Past Medical History  Diagnosis Date  . Sleep apnea     uses cpap  . Hypertension   . GERD (gastroesophageal reflux disease)   . Seasonal allergies   . Arthritis    Past Surgical History  Procedure Laterality Date  . Tonsillectomy    . Fracture surgery  1981    orif l ankle  . Nasal septum surgery  1992  . Lipoma excision  2004    rt neck  . Ankle hardware removal  2006    left   . Uvulopalatopharyngoplasty  2008  . Knee arthroscopy  09/20/2011    Procedure: ARTHROSCOPY KNEE;  Surgeon: Gearlean Alf, MD;  Location: [facility];  Service: Orthopedics;  Laterality: Right;  Medial meniscal DEBRIDEMENT with chondroplasty, right knee  . Knee arthroscopy Left 04/07/2013    Procedure: LEFT KNEE ARTHROSCOPY WITH MEDIAL MENISCUS  DEBRIDEMENT ;  Surgeon: Gearlean Alf, MD;  Location: [facility];  Service: Orthopedics;  Laterality: Left;   Social  History:  reports that he has never smoked. He quit smokeless tobacco use about 14 years ago. He reports that he drinks alcohol. He reports that he uses illicit drugs (Marijuana). Where does patient live At home. Can patient participate in ADLs? Yes.  Allergies  Allergen Reactions  . Oxycodone-Acetaminophen Other (See Comments)    Nausea    Family History:  Family History  Problem Relation Age of Onset  . Hypertension Mother       Prior to Admission medications   Medication Sig Start Date End Date Taking? Authorizing Provider  aspirin EC 81 MG EC tablet Take 1 tablet (81 mg total) by mouth daily. 09/22/15  Yes Shanker Kristeen Mans, MD  atorvastatin (LIPITOR) 20 MG tablet Take 1 tablet (20 mg total) by mouth daily at 6 PM. 09/22/15  Yes Shanker Kristeen Mans, MD  carvedilol (COREG) 6.25 MG tablet Take 1 tablet (6.25 mg total) by mouth 2 (two) times daily with a meal. 09/22/15  Yes Shanker Kristeen Mans, MD  ranitidine (ZANTAC) 150 MG tablet Take 1 tablet (150 mg total) by mouth 2 (two) times daily. Reported on 09/20/2015 09/22/15  Yes Shanker Kristeen Mans, MD  TESTIM 50 MG/5GM (1%) GEL Apply 5 g topically daily. 08/30/15  Yes Historical Provider, MD  zolpidem (AMBIEN) 10 MG tablet Take 10 mg by mouth at bedtime as needed for sleep.  08/30/15  Yes Historical Provider, MD  nitroGLYCERIN (NITROSTAT) 0.4 MG SL tablet Place 1 tablet (0.4 mg total) under the tongue every 5 (five) minutes as needed for  chest pain. Patient not taking: Reported on 09/27/2015 09/22/15   Jonetta Osgood, MD    Physical Exam: Filed Vitals:   09/27/15 2300 09/27/15 2345 09/28/15 0015 09/28/15 0045  BP: 130/72 120/68 123/66 129/76  Pulse: 57 57 63 59  Temp:      Resp: 13 9 22 17   SpO2: 98% 97% 91% 88%     General:  Moderately built and nourished.  Eyes: Anicteric. No pallor.  ENT: No discharge from the ears eyes nose or mouth.  Neck: No mass felt.  Cardiovascular: S1-S2 heard.  Respiratory: No rhonchi or  crepitations.  Abdomen: Soft nontender bowel sounds present.  Skin: No rash.  Musculoskeletal: No edema.  Psychiatric: Appears normal.  Neurologic: Alert awake oriented to time place and person. Moves all extremities.  Labs on Admission:  Basic Metabolic Panel:  Recent Labs Lab 09/21/15 0254 09/22/15 0302 09/27/15 2120  NA  --  141 141  K  --  3.3* 3.4*  CL  --  104 102  CO2  --  27 27  GLUCOSE  --  112* 90  BUN  --  11 11  CREATININE  --  1.09 1.08  CALCIUM  --  8.9 9.1  MG 2.1  --   --   PHOS 3.4  --   --    Liver Function Tests: No results for input(s): AST, ALT, ALKPHOS, BILITOT, PROT, ALBUMIN in the last 168 hours. No results for input(s): LIPASE, AMYLASE in the last 168 hours. No results for input(s): AMMONIA in the last 168 hours. CBC:  Recent Labs Lab 09/22/15 0302 09/27/15 2120  WBC 7.4 10.1  HGB 14.0 13.6  HCT 43.4 41.9  MCV 88.6 88.4  PLT 234 247   Cardiac Enzymes:  Recent Labs Lab 09/21/15 0207 09/21/15 0448  TROPONINI 0.08* 0.06*    BNP (last 3 results) No results for input(s): BNP in the last 8760 hours.  ProBNP (last 3 results) No results for input(s): PROBNP in the last 8760 hours.  CBG: No results for input(s): GLUCAP in the last 168 hours.  Radiological Exams on Admission: Dg Chest 2 View  09/27/2015  CLINICAL DATA:  Chest pressure with shortness of breath onset today. EXAM: CHEST  2 VIEW COMPARISON:  Chest x-rays dated 09/20/2015 and 04/01/2013. FINDINGS: Mild cardiomegaly is stable. Overall cardiomediastinal silhouette is stable in size and configuration. There is a chronic mild interstitial prominence which is also stable. Lungs otherwise clear. No evidence of pneumonia. No pleural effusion. No pneumothorax seen. Osseous structures about the chest are unremarkable. IMPRESSION: No active cardiopulmonary disease. Electronically Signed   By: Franki Cabot M.D.   On: 09/27/2015 20:39    EKG: Independently reviewed. Normal sinus  rhythm with ST-T changes and T-wave inversion in the lateral leads comparable to the old EKG.  Assessment/Plan Principal Problem:   Chest pain Active Problems:   Essential hypertension   Obstructive sleep apnea   1. Chest pain - concerning for angina. Improved with morphine. At this time we will cycle cardiac markers keep patient nothing by mouth in anticipation of possible cardiac cath. Continue with aspirin and beta blockers and statins. When necessary nitroglycerin. Cardiology consult requested. 2. Hypertension - continue Coreg. 3. Hyperlipidemia on statins. 4. OSA on CPAP.   DVT Prophylaxis Lovenox.  Code Status: Full code.  Family Communication: Patient's family at the bedside.  Disposition Plan: Admit for observation.    Nailani Full N. [name] Pager [phone].  If 7PM-7AM, please contact night-coverage [email or web address]  Password [email or web address] 09/28/2015, 12:58 AM

## 2015-09-28 NOTE — H&P (View-Only) (Signed)
CARDIOLOGY CONSULT NOTE   Patient ID: Joshua Stanton MRN: [id number] DOB/AGE: Aug 19, 1960 55 y.o.  Admit date: 09/27/2015  Primary Physician   [name], MD Primary Cardiologist   Dr. [name] Reason for Consultation   Chest pain Requesting Physician  Dr. Tana Coast  HPI: Joshua Stanton is a 54 y.o. male with a history of HTN, HL, GERD and sleep apnea on CPAP who presented with CP.   Admitted 3/8-3/10 for atypical anginal pain and new T-wave inversion. The patient was seen by Dr. [name] and recommended cardiac catheterization for definite evaluation however patient refused. She currently nuclear stress was low risks and discharged on aspirin/statin and beta blocker.  Since discharge he was doing well up until yesterday 3/15 when he had a few episodes of exertional chest pain. He took off from work Monday and Tuesday and went to work yesterday where he had a exertional chest pain associated with shortness of breath. He described the pain as a substernal chest pressure. His pain initially resolved with sublingual nitroglycerin. 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 His pain resolved with morphine. He did have radiation of pain to his left jaw. Denied nausea, vomiting or diaphoresis. The patient denies lower extremity edema, orthopnea, PND, syncope, blood in his stool or urine, dizziness, palpitation.  EKG shows sinus rhythm with improvement in ST changes compared to prior EKG of 09/21/15, especially in lead V5 and V6. Troponin negative. Chest x-ray without acute disease. D-dimer negative. He is currently chest pain-free.  The patient is scheduled to have a echocardiogram 10/04/15 as outpatient.   Past Medical History  Diagnosis Date  . Sleep apnea     uses cpap  . Hypertension   . GERD (gastroesophageal reflux disease)   . Seasonal allergies   .  Arthritis      Past Surgical History  Procedure Laterality Date  . Tonsillectomy    . Fracture surgery  1981    orif l ankle  . Nasal septum surgery  1992  . Lipoma excision  2004    rt neck  . Ankle hardware removal  2006    left   . Uvulopalatopharyngoplasty  2008  . Knee arthroscopy  09/20/2011    Procedure: ARTHROSCOPY KNEE;  Surgeon: Gearlean Alf, MD;  Location: [facility];  Service: Orthopedics;  Laterality: Right;  Medial meniscal DEBRIDEMENT with chondroplasty, right knee  . Knee arthroscopy Left 04/07/2013    Procedure: LEFT KNEE ARTHROSCOPY WITH MEDIAL MENISCUS  DEBRIDEMENT ;  Surgeon: Gearlean Alf, MD;  Location: [facility];  Service: Orthopedics;  Laterality: Left;    Allergies  Allergen Reactions  . Oxycodone-Acetaminophen Other (See Comments)    Nausea    I have reviewed the patient's current medications . aspirin EC  325 mg Oral Daily  . atorvastatin  20 mg Oral q1800  . carvedilol  6.25 mg Oral BID WC  . enoxaparin (LOVENOX) injection  40 mg Subcutaneous Q24H  . famotidine  20 mg Oral BID  . potassium chloride  40 mEq Oral Once     morphine injection, nitroGLYCERIN, ondansetron (ZOFRAN) IV, zolpidem  Prior to Admission medications   Medication Sig Start Date End Date Taking? Authorizing Provider  aspirin EC 81 MG EC tablet Take 1 tablet (81 mg total) by mouth daily. 09/22/15  Yes Shanker Kristeen Mans, MD  atorvastatin (LIPITOR) 20 MG tablet Take  1 tablet (20 mg total) by mouth daily at 6 PM. 09/22/15  Yes Shanker Kristeen Mans, MD  carvedilol (COREG) 6.25 MG tablet Take 1 tablet (6.25 mg total) by mouth 2 (two) times daily with a meal. 09/22/15  Yes Shanker Kristeen Mans, MD  ranitidine (ZANTAC) 150 MG tablet Take 1 tablet (150 mg total) by mouth 2 (two) times daily. Reported on 09/20/2015 09/22/15  Yes Shanker Kristeen Mans, MD  TESTIM 50 MG/5GM (1%) GEL Apply 5 g topically daily. 08/30/15  Yes Historical Provider, MD  zolpidem (AMBIEN) 10 MG tablet Take 10 mg by  mouth at bedtime as needed for sleep.  08/30/15  Yes Historical Provider, MD  nitroGLYCERIN (NITROSTAT) 0.4 MG SL tablet Place 1 tablet (0.4 mg total) under the tongue every 5 (five) minutes as needed for chest pain. Patient not taking: Reported on 09/27/2015 09/22/15   Jonetta Osgood, MD     Social History   Social History  . Marital Status: Married    Spouse Name: N/A  . Number of Children: N/A  . Years of Education: N/A   Occupational History  . Not on file.   Social History Main Topics  . Smoking status: Never Smoker   . Smokeless tobacco: Former Systems developer    Quit date: 09/16/2001  . Alcohol Use: Yes     Comment: socially  . Drug Use: Yes    Special: Marijuana     Comment: 20 years ago  . Sexual Activity: Not on file   Other Topics Concern  . Not on file   Social History Narrative    No family status information on file.   Family History  Problem Relation Age of Onset  . Hypertension Mother       ROS:  Full 14 point review of systems complete and found to be negative unless listed above.  Physical Exam: Blood pressure 123/70, pulse 65, temperature 97.5 F (36.4 C), temperature source Oral, resp. rate 14, weight 267 lb 11.2 oz (121.428 kg), SpO2 100 %.  General: Well developed, well nourished, male in no acute distress Head: Eyes PERRLA, No xanthomas. Normocephalic and atraumatic, oropharynx without edema or exudate.  Lungs: Resp regular and unlabored, CTA. Heart: RRR no s3, s4, or murmurs..   Neck: No carotid bruits. No lymphadenopathy.  JVD. Abdomen: Bowel sounds present, abdomen soft and non-tender without masses or hernias noted. Msk:  No spine or cva tenderness. No weakness, no joint deformities or effusions. Extremities: No clubbing, cyanosis or edema. DP/PT/Radials 2+ and equal bilaterally. Neuro: Alert and oriented X 3. No focal deficits noted. Psych:  Good affect, responds appropriately Skin: No rashes or lesions noted.  Labs:   Lab Results    Component Value Date   WBC 9.0 09/28/2015   HGB 13.5 09/28/2015   HCT 41.8 09/28/2015   MCV 88.9 09/28/2015   PLT 231 09/28/2015   No results for input(s): INR in the last 72 hours.  Recent Labs Lab 09/27/15 2120 09/28/15 0216  NA 141  --   K 3.4*  --   CL 102  --   CO2 27  --   BUN 11  --   CREATININE 1.08 1.17  CALCIUM 9.1  --   GLUCOSE 90  --    MAGNESIUM  Date Value Ref Range Status  09/21/2015 2.1 1.7 - 2.4 mg/dL Final    Recent Labs  09/28/15 0216 09/28/15 0745  TROPONINI 0.03 0.03    Recent Labs  09/27/15 2102 09/28/15 0033  TROPIPOC 0.04 0.02   No results found for: PROBNP Lab Results  Component Value Date   CHOL 184 09/21/2015   HDL 38* 09/21/2015   LDLCALC 125* 09/21/2015   TRIG 106 09/21/2015   Lab Results  Component Value Date   DDIMER <0.27 09/28/2015   No results found for: LIPASE, AMYLASE No results found for: TSH, T4TOTAL, T3FREE, THYROIDAB No results found for: VITAMINB12, FOLATE, FERRITIN, TIBC, IRON, RETICCTPCT  Echo:   ECG:   Vent. rate 60 BPM PR interval 168 ms QRS duration 110 ms QT/QTc 433/433 ms P-R-T axes 12 2 148  Radiology:  Dg Chest 2 View  09/27/2015  CLINICAL DATA:  Chest pressure with shortness of breath onset today. EXAM: CHEST  2 VIEW COMPARISON:  Chest x-rays dated 09/20/2015 and 04/01/2013. FINDINGS: Mild cardiomegaly is stable. Overall cardiomediastinal silhouette is stable in size and configuration. There is a chronic mild interstitial prominence which is also stable. Lungs otherwise clear. No evidence of pneumonia. No pleural effusion. No pneumothorax seen. Osseous structures about the chest are unremarkable. IMPRESSION: No active cardiopulmonary disease. Electronically Signed   By: Franki Cabot M.D.   On: 09/27/2015 20:39    ASSESSMENT AND PLAN:     1. Chest pain - Concerning for anginal pain. Responsive to sublingual nitroglycerin however reoccurred pain with exertion. Troponin negative. EKG shows  improvement in ST segment compared to prior EKG. Currently chest pain-free. - He is scheduled to have echocardiogram as an outpatient--> will do during this admission.  - For cath today. The patient understands that risks include but are not limited to stroke (1 in 1000), death (1 in 93), kidney failure [usually temporary] (1 in 500), bleeding (1 in 200), allergic reaction [possibly serious] (1 in 200), and agrees to proceed.  - Continue his aspirin and statin and beta blocker. - If develops chest pain start nitroglycerin paste and heparin.  2. HL -09/21/2015: Cholesterol 184; HDL 38*; LDL Cholesterol 125*; Triglycerides 106; VLDL 21  - Continue statin  3. Obstructive sleep apnea - On CPAP  4. HTN - Relatively stable. Continue BB.   5. Hypokalemia - Will give suppliment  Signed: Bhagat,Bhavinkumar, PA 09/28/2015, 9:45 AM Pager [phone]  Co-Sign MD  A 55 y.o. male with a history of HTN, HL, GERD and sleep apnea on CPAP who presented with CP, he is a patient of Dr [name], recently admitted for similar complain and had an ECG suspicious for ischemia, cardiac cath was recommended but the patient denied.  He is coming back with worsening chest pains. We will schedule for a cardiac cath today.  ECG still shows SR, LVH and possible ischemia in the anterolateral leads.  Continue ASA, atorvastatin increase to 40 mg po daily, continue carvedilol 6.25 mg po BID.  Dorothy Spark 09/28/2015

## 2015-09-28 NOTE — Progress Notes (Signed)
Pt c/o Right arm pain at site of cath insertion.  Swelling around site noted.  No bleeding at site of dressing.  Per protocol, pressure dressing applied, Dr. Meda Coffee paged

## 2015-09-28 NOTE — Progress Notes (Signed)
Patient is refusing CPAP tonight, states he has tried it before and he could not tolerate it. Also stated he should be going home tomorrow. RT made patient aware that if he changed his mind to call and we would get a CPAP machine and setup for him

## 2015-09-28 NOTE — Progress Notes (Signed)
Per conversations with Dr. Debara Pickett with Cardiology and Dr. Tana Coast with internal medicine, we are to re-evaluate pt's right arm in an hour.  If any swelling at all remains, we are to keep pt overnight for observation.  If swelling has subsided, he is clear for discharge.  Dr. Tana Coast would like to be notified about the swelling after the hour is up.  Oncoming RN has also been made aware of the situation and the plan for the patient and will follow up with Dr. Tana Coast.  At this time, pt sitting up at bedside, R arm elevated in pressure dressing.  No complaints or needs at this time.

## 2015-09-28 NOTE — Progress Notes (Signed)
TR band removed gauze and tegaderm applied. Pulse palpated in R radial wrist, thumb warm to touch.

## 2015-09-29 ENCOUNTER — Encounter (HOSPITAL_COMMUNITY): Payer: Self-pay | Admitting: Cardiovascular Disease

## 2015-09-29 ENCOUNTER — Other Ambulatory Visit (HOSPITAL_COMMUNITY): Payer: 59

## 2015-09-29 DIAGNOSIS — G4733 Obstructive sleep apnea (adult) (pediatric): Secondary | ICD-10-CM | POA: Diagnosis not present

## 2015-09-29 DIAGNOSIS — R079 Chest pain, unspecified: Secondary | ICD-10-CM | POA: Diagnosis not present

## 2015-09-29 DIAGNOSIS — I10 Essential (primary) hypertension: Secondary | ICD-10-CM | POA: Diagnosis not present

## 2015-09-29 DIAGNOSIS — I209 Angina pectoris, unspecified: Secondary | ICD-10-CM | POA: Diagnosis not present

## 2015-09-29 DIAGNOSIS — I517 Cardiomegaly: Secondary | ICD-10-CM | POA: Diagnosis not present

## 2015-09-29 DIAGNOSIS — R072 Precordial pain: Secondary | ICD-10-CM

## 2015-09-29 LAB — BASIC METABOLIC PANEL
ANION GAP: 8 (ref 5–15)
BUN: 11 mg/dL (ref 6–20)
CO2: 27 mmol/L (ref 22–32)
Calcium: 8.7 mg/dL — ABNORMAL LOW (ref 8.9–10.3)
Chloride: 107 mmol/L (ref 101–111)
Creatinine, Ser: 1.05 mg/dL (ref 0.61–1.24)
GLUCOSE: 93 mg/dL (ref 65–99)
POTASSIUM: 3.5 mmol/L (ref 3.5–5.1)
SODIUM: 142 mmol/L (ref 135–145)

## 2015-09-29 LAB — CBC
HEMATOCRIT: 43.7 % (ref 39.0–52.0)
HEMOGLOBIN: 14.2 g/dL (ref 13.0–17.0)
MCH: 28.7 pg (ref 26.0–34.0)
MCHC: 32.5 g/dL (ref 30.0–36.0)
MCV: 88.3 fL (ref 78.0–100.0)
Platelets: 229 10*3/uL (ref 150–400)
RBC: 4.95 MIL/uL (ref 4.22–5.81)
RDW: 13.5 % (ref 11.5–15.5)
WBC: 7.7 10*3/uL (ref 4.0–10.5)

## 2015-09-29 LAB — HEMOGLOBIN A1C
HEMOGLOBIN A1C: 6.1 % — AB (ref 4.8–5.6)
MEAN PLASMA GLUCOSE: 128 mg/dL

## 2015-09-29 MED ORDER — PANTOPRAZOLE SODIUM 40 MG PO TBEC: 40.0000 mg | DELAYED_RELEASE_TABLET | Freq: Every day | ORAL | Status: DC

## 2015-09-29 MED ORDER — NITROGLYCERIN 0.4 MG SL SUBL: 0.4000 mg | SUBLINGUAL_TABLET | SUBLINGUAL | Status: DC | PRN

## 2015-09-29 MED ORDER — LOSARTAN POTASSIUM 50 MG PO TABS: 50.0000 mg | ORAL_TABLET | Freq: Every day | ORAL | Status: DC

## 2015-09-29 NOTE — Discharge Summary (Signed)
Physician Discharge Summary   Patient ID: KROY SAFFLE MRN: [id number] DOB/AGE: Mar 27, 1961 55 y.o.  Admit date: 09/27/2015 Discharge date: 09/29/2015  Primary Care Physician:  [name], MD  Discharge Diagnoses:    . Chest pain resolved  . Obstructive sleep apnea . Essential hypertension GERD Right wrist hematoma  Consults:  Cardiology  Recommendations for Outpatient Follow-up:  1. Please repeat CBC/BMET at next visit 2. Patient has previously scheduled echo on 3/22 3. Patient was placed on PPI, recommended endoscopy if he continues to have chest pain episodes 4. Patient started on losartan, follow BP closely   DIET: Heart healthy diet    Allergies:   Allergies  Allergen Reactions  . Oxycodone-Acetaminophen Other (See Comments)    Nausea     DISCHARGE MEDICATIONS: Current Discharge Medication List    START taking these medications   Details  losartan (COZAAR) 50 MG tablet Take 1 tablet (50 mg total) by mouth daily. Qty: 30 tablet, Refills: 3    pantoprazole (PROTONIX) 40 MG tablet Take 1 tablet (40 mg total) by mouth daily. Qty: 30 tablet, Refills: 2      CONTINUE these medications which have CHANGED   Details  nitroGLYCERIN (NITROSTAT) 0.4 MG SL tablet Place 1 tablet (0.4 mg total) under the tongue every 5 (five) minutes as needed for chest pain. Qty: 30 tablet, Refills: 3      CONTINUE these medications which have NOT CHANGED   Details  aspirin EC 81 MG EC tablet Take 1 tablet (81 mg total) by mouth daily.    atorvastatin (LIPITOR) 20 MG tablet Take 1 tablet (20 mg total) by mouth daily at 6 PM. Qty: 30 tablet, Refills: 0    carvedilol (COREG) 6.25 MG tablet Take 1 tablet (6.25 mg total) by mouth 2 (two) times daily with a meal. Qty: 60 tablet, Refills: 0    TESTIM 50 MG/5GM (1%) GEL Apply 5 g topically daily. Refills: 5    zolpidem (AMBIEN) 10 MG tablet Take 10 mg by mouth at bedtime as needed for sleep.  Refills: 3      STOP  taking these medications     ranitidine (ZANTAC) 150 MG tablet          Brief H and P: For complete details please refer to admission H and P, but in brief Patient is a 55 year old male with hypertension, hyperlipidemia, GERD, OSA on CPAP presented with chest pain. Patient was recently admitted last week for atypical angina and had new T-wave inversions on EKG. Patient was evaluated by cardiology and recommended cardiac catheterization however patient had refused. Patient underwent nuclear stress test which was deemed as low risk and discharged on aspirin, beta blocker and statin. Patient returned back with the chest discomfort, exertional, associated with shortness of breath, dizziness. Patient admitted for further workup.  Hospital Course:   Chest pain typical, exertional, concerning for angina: - Currently chest pain resolved. Troponins were negative so far, EKG with T-wave inversions in the lateral leads which were seen with previous admission as well. Patient had nuclear stress test during the previous admission which was deemed as low risk. Patient was however recommended cardiac catheterization which he had declined during the previous admission. Patient underwent cardiac catheterization this admission, which showed normal coronary arteries and EF of 55-60%. Patient likely has hypertensive heart disease and was recommended BP control, losartan. Continue Coreg. D-dimer was normal. Hemoglobin A1c 6.1.   Essential hypertension - BP currently stable, continue beta blocker, added losartan  Hyperlipidemia -  Lipid panel on 3/9 showed LDL 125, patient was placed on Lipitor   Obstructive sleep apnea - On CPAP  GERD Patient had been taking ranitidine prior to admission, changed to PPI. Patient was recommended to follow up with his PCP if he continues to have recurrent atypical chest pain episodes, may need endoscopy for further workup.  Day of Discharge BP 143/83 mmHg  Pulse 65   Temp(Src) 97.8 F (36.6 C) (Oral)  Resp 18  Wt 121.428 kg (267 lb 11.2 oz)  SpO2 99%  Physical Exam: General: Alert and awake oriented x3 not in any acute distress. HEENT: anicteric sclera, pupils reactive to light and accommodation CVS: S1-S2 clear no murmur rubs or gallops Chest: clear to auscultation bilaterally, no wheezing rales or rhonchi Abdomen: soft nontender, nondistended, normal bowel sounds Extremities: no cyanosis, clubbing or edema noted bilaterally Neuro: Cranial nerves II-XII intact, no focal neurological deficits   The results of significant diagnostics from this hospitalization (including imaging, microbiology, ancillary and laboratory) are listed below for reference.    LAB RESULTS: Basic Metabolic Panel:  Recent Labs Lab 09/27/15 2120 09/28/15 0216 09/29/15 0500  NA 141  --  142  K 3.4*  --  3.5  CL 102  --  107  CO2 27  --  27  GLUCOSE 90  --  93  BUN 11  --  11  CREATININE 1.08 1.17 1.05  CALCIUM 9.1  --  8.7*   Liver Function Tests: No results for input(s): AST, ALT, ALKPHOS, BILITOT, PROT, ALBUMIN in the last 168 hours. No results for input(s): LIPASE, AMYLASE in the last 168 hours. No results for input(s): AMMONIA in the last 168 hours. CBC:  Recent Labs Lab 09/28/15 0216 09/29/15 0500  WBC 9.0 7.7  HGB 13.5 14.2  HCT 41.8 43.7  MCV 88.9 88.3  PLT 231 229   Cardiac Enzymes:  Recent Labs Lab 09/28/15 0745 09/28/15 1509  TROPONINI 0.03 0.03   BNP: Invalid input(s): POCBNP CBG: No results for input(s): GLUCAP in the last 168 hours.  Significant Diagnostic Studies:  Dg Chest 2 View  09/27/2015  CLINICAL DATA:  Chest pressure with shortness of breath onset today. EXAM: CHEST  2 VIEW COMPARISON:  Chest x-rays dated 09/20/2015 and 04/01/2013. FINDINGS: Mild cardiomegaly is stable. Overall cardiomediastinal silhouette is stable in size and configuration. There is a chronic mild interstitial prominence which is also stable. Lungs  otherwise clear. No evidence of pneumonia. No pleural effusion. No pneumothorax seen. Osseous structures about the chest are unremarkable. IMPRESSION: No active cardiopulmonary disease. Electronically Signed   By: Franki Cabot M.D.   On: 09/27/2015 20:39      Disposition and Follow-up: Discharge Instructions    Diet - low sodium heart healthy    Complete by:  As directed      Discharge instructions    Complete by:  As directed   Please donot push, pull, lift weights with right hand/wrist. Keep previously schedule outpatient echo and post hospital appointment with APP.  No driving for 24 hours. No lifting over 5 lbs for 10 days. No sexual activity for 1 week. Keep procedure site clean & dry. If you notice increased pain, swelling, bleeding or pus, call/return! You may shower, but no soaking baths/hot tubs/pools for 1 week.     Increase activity slowly    Complete by:  As directed              Procedures    Left Heart Cath and Coronary  Angiography    Conclusion     The left ventricular systolic function is normal.  1. Normal coronary arteries. 2. Normal LV systolic function with an ejection fraction of 55-60%. 3. Moderate systemic hypertension and moderately elevated left ventricular end-diastolic pressure.  Recommendations: The patient likely has hypertensive heart disease. Recommend blood pressure control. I added losartan. He is already on carvedilol.     DISCHARGE FOLLOW-UP Follow-up Information    Follow up with [name], MD. Schedule an appointment as soon as possible for a visit in 10 days.   Specialty:  Family Medicine   Why:  for hospital follow-up   Contact information:   [place] [address] [phone]       Follow up with Lyda Jester, PA-C On 10/10/2015.   Specialties:  Cardiology, Radiology   Why:  at 1:30PM   Contact information:   [place] [address] [phone]        Follow up On 10/04/2015.   Why:  at 9:30. please keep appt for previously scheduled echo   Contact information:   ECHO        Time spent on Discharge: 25 minutes   Signed:   Brooks Stotz M.D. [name] 09/29/2015, 10:38 AM Pager: [phone]

## 2015-09-29 NOTE — Progress Notes (Signed)
Patient Name: Joshua Stanton Date of Encounter: 09/29/2015   SUBJECTIVE  Feeling well. No chest pain, sob or palpitations.   CURRENT MEDS . aspirin EC  325 mg Oral Daily  . atorvastatin  20 mg Oral q1800  . carvedilol  6.25 mg Oral BID WC  . enoxaparin (LOVENOX) injection  40 mg Subcutaneous Q24H  . famotidine  20 mg Oral BID  . losartan  50 mg Oral Daily  . sodium chloride flush  3 mL Intravenous Q12H    OBJECTIVE  Filed Vitals:   09/28/15 1545 09/28/15 2101 09/29/15 0553 09/29/15 0821  BP: 140/85 145/89 118/58 143/83  Pulse: 69 58 61 65  Temp:  97.7 F (36.5 C) 97.8 F (36.6 C)   TempSrc:  Oral Oral   Resp: 18 18 18    Weight:      SpO2: 93% 94% 99%     Intake/Output Summary (Last 24 hours) at 09/29/15 0937 Last data filed at 09/28/15 2101  Gross per 24 hour  Intake    240 ml  Output    200 ml  Net     40 ml   Filed Weights   09/28/15 0153  Weight: 267 lb 11.2 oz (121.428 kg)    PHYSICAL EXAM  General: Pleasant, NAD. Neuro: Alert and oriented X 3. Moves all extremities spontaneously. Psych: Normal affect. HEENT:  Normal  Neck: Supple without bruits or JVD. Lungs:  Resp regular and unlabored, CTA. Heart: RRR no s3, s4, or murmurs. Abdomen: Soft, non-tender, non-distended, BS + x 4.  Extremities: No clubbing, cyanosis or edema. DP/PT/Radials 2+ and equal bilaterally. R radial cath site has mild swelling and TTP.   Accessory Clinical Findings  CBC  Recent Labs  09/28/15 0216 09/29/15 0500  WBC 9.0 7.7  HGB 13.5 14.2  HCT 41.8 43.7  MCV 88.9 88.3  PLT 231 [id number]   Basic Metabolic Panel  Recent Labs  09/27/15 2120 09/28/15 0216 09/29/15 0500  NA 141  --  142  K 3.4*  --  3.5  CL 102  --  107  CO2 27  --  27  GLUCOSE 90  --  93  BUN 11  --  11  CREATININE 1.08 1.17 1.05  CALCIUM 9.1  --  8.7*   Liver Function Tests No results for input(s): AST, ALT, ALKPHOS, BILITOT, PROT, ALBUMIN in the last 72 hours. No results for input(s):  LIPASE, AMYLASE in the last 72 hours. Cardiac Enzymes  Recent Labs  09/28/15 0216 09/28/15 0745 09/28/15 1509  TROPONINI 0.03 0.03 0.03   BNP Invalid input(s): POCBNP D-Dimer  Recent Labs  09/28/15 0744  DDIMER <0.27   Hemoglobin A1C  Recent Labs  09/28/15 0745  HGBA1C 6.1*    TELE  Sinus rhythm   Left Heart Cath and Coronary Angiography    Conclusion     The left ventricular systolic function is normal.  1. Normal coronary arteries. 2. Normal LV systolic function with an ejection fraction of 55-60%. 3. Moderate systemic hypertension and moderately elevated left ventricular end-diastolic pressure.  Recommendations: The patient likely has hypertensive heart disease. Recommend blood pressure control. I added losartan. He is already on carvedilol.     Radiology/Studies  Dg Chest 2 View  09/27/2015  CLINICAL DATA:  Chest pressure with shortness of breath onset today. EXAM: CHEST  2 VIEW COMPARISON:  Chest x-rays dated 09/20/2015 and 04/01/2013. FINDINGS: Mild cardiomegaly is stable. Overall cardiomediastinal silhouette is stable in size and configuration. There is  a chronic mild interstitial prominence which is also stable. Lungs otherwise clear. No evidence of pneumonia. No pleural effusion. No pneumothorax seen. Osseous structures about the chest are unremarkable. IMPRESSION: No active cardiopulmonary disease. Electronically Signed   By: Franki Cabot M.D.   On: 09/27/2015 20:39   Dg Chest 2 View  09/20/2015  CLINICAL DATA:  Chest pain since 0900 today EXAM: CHEST  2 VIEW COMPARISON:  04/01/2013 and multiple prior studies FINDINGS: Heart size upper normal. Vascular pattern normal. Lungs clear. No pleural effusion. IMPRESSION: No active cardiopulmonary disease. Electronically Signed   By: Skipper Cliche M.D.   On: 09/20/2015 19:45   Nm Myocar Multi W/spect W/wall Motion / Ef  09/22/2015   T wave inversion was noted during stress.  This is a low risk study.   The left ventricular ejection fraction is moderately decreased (30-44%).  Nuclear stress EF: 44%.  1. Fixed, small mild basal to mid inferolateral defect.  Possible attenuation.  No ischemia. 2. EF 44% with diffuse hypokinesis. 3. Overall low risk study.    ASSESSMENT AND PLAN    1. Chest pain - Troponin negative. EKG shows improvement in ST segment compared to prior EKG. Chest pain-free. - Cath showed normal coronaries with ef of 55-60%. Moderate systemic hypertension and moderately elevated left ventricular end-diastolic pressure. - Continue ASA 81, coreg, losarta and lipitor  2. HL -09/21/2015: Cholesterol 184; HDL 38*; LDL Cholesterol 125*; Triglycerides 106; VLDL 21  - Continue statin  3. Obstructive sleep apnea - On CPAP  4. HTN - Relatively stable. Continue BB and ARB. Titrate as outpatient.   5. Hypokalemia - Resolved  6. R radial hematoma - improved overnight.   Dispo: keep previously schedule outpatient echo and post hospital appointment with APP. Ok to [employer].  No driving for 24 hours. No lifting over 5 lbs for 10 days. No sexual activity for 1 week. You may return to work on 09/24/15. Keep procedure site clean & dry. If you notice increased pain, swelling, bleeding or pus, call/return!  You may shower, but no soaking baths/hot tubs/pools for 1 week.   Signed, Leanor Kail PA-C Pager [phone]  The patient was seen, examined and discussed with Bhagat,Bhavinkumar PA-C and I agree with the above.   A 55 y.o. male with a history of HTN, HL, GERD and sleep apnea on CPAP who presented with CP, he is a patient of Dr [name], recently admitted for similar complain and had an ECG suspicious for ischemia, cardiac cath was recommended but the patient denied. Hecame back with worsening chest pains. Cardiac cath yesterday showed normal coronaries and normal LVEF. He has poorly controlled hypertension, LVH on ECG, echo is pending.  Continue ASA, atorvastatin increase to 40  mg po daily, continue carvedilol 6.25 mg po BID, we added losartan 50 mg po daily, I would add hydrochlorothiazide 25 mg po daily and discharge home, we will arrange for an outpatient follow up.   Dorothy Spark 09/29/2015

## 2015-09-29 NOTE — Progress Notes (Signed)
Discharge Note:  Patient alert and oriented X 4 and in no distress.  Patient and his wife given discharge instructions regarding signs and symptoms to report, medications, activity, diet, and upcoming appointments.  Both verbalized understanding of discharge instructions.  Peripheral IV and telemetry discontinued.  Patient confirmed that he had all of his personal belongings.  He was transported out via wheelchair by hospital volunteer.

## 2015-10-04 ENCOUNTER — Other Ambulatory Visit: Payer: Self-pay

## 2015-10-04 ENCOUNTER — Ambulatory Visit (HOSPITAL_COMMUNITY): Payer: 59 | Attending: Cardiology

## 2015-10-04 DIAGNOSIS — E785 Hyperlipidemia, unspecified: Secondary | ICD-10-CM | POA: Diagnosis not present

## 2015-10-04 DIAGNOSIS — I209 Angina pectoris, unspecified: Secondary | ICD-10-CM | POA: Insufficient documentation

## 2015-10-04 DIAGNOSIS — R079 Chest pain, unspecified: Secondary | ICD-10-CM | POA: Diagnosis present

## 2015-10-04 DIAGNOSIS — I071 Rheumatic tricuspid insufficiency: Secondary | ICD-10-CM | POA: Diagnosis not present

## 2015-10-04 DIAGNOSIS — I119 Hypertensive heart disease without heart failure: Secondary | ICD-10-CM | POA: Insufficient documentation

## 2015-10-04 MED ORDER — PERFLUTREN LIPID MICROSPHERE
2.0000 mL | INTRAVENOUS | Status: AC | PRN
  Administered 2015-10-04: 2 mL via INTRAVENOUS

## 2015-10-10 ENCOUNTER — Encounter: Payer: Self-pay | Admitting: Cardiology

## 2015-10-10 ENCOUNTER — Ambulatory Visit (INDEPENDENT_AMBULATORY_CARE_PROVIDER_SITE_OTHER): Payer: 59 | Admitting: Cardiology

## 2015-10-10 VITALS — BP 124/86 | HR 73 | Ht 67.0 in | Wt 263.0 lb

## 2015-10-10 DIAGNOSIS — I10 Essential (primary) hypertension: Secondary | ICD-10-CM | POA: Diagnosis not present

## 2015-10-10 NOTE — Patient Instructions (Addendum)
Medication Instructions:  Your physician recommends that you continue on your current medications as directed. Please refer to the Current Medication list given to you today.   Labwork: Have Dr. Kenton Kingfisher recheck your Cholesterol and Hepatic Function at your next visit with him  Testing/Procedures: None ordered  Follow-Up: Your physician wants you to follow-up in: [place] DR. HARDING   You will receive a reminder letter in the mail two months in advance. If you don't receive a letter, please call our office to schedule the follow-up appointment.    Any Other Special Instructions Will Be Listed Below (If Applicable).     If you need a refill on your cardiac medications before your next appointment, please call your pharmacy.

## 2015-10-10 NOTE — Progress Notes (Signed)
10/10/2015 Joshua Stanton [facility]   May 14, 1961  [id number]  Primary Physician [name], MD Primary Cardiologist: Dr. [name]  Reason for Visit/CC: [facility] F/U for CP.   HPI:  55 y/o AAM with h/o HTN, DM and HLD, presenting to clinic for post hospital f/u after 2 recent admissions for chest pain. He was initially admitted 09/21/15 with atypical chest pain, however with abnormal EKG findings of ST depressions. Troponin's were cycled and were mildly abnormal but with flat trend. LHC was initially recommended, however the patient opted to undergo NST instead. This showed a fixed small mild basal to mid inferolateral defect but no ischemia. EF was 44% with diffuse hypokinesis on stress test. This was an overall low risk study.  Medical therapy with antiplatelets, statin and beta blockers were recommended on discharge. It was advised that If patient were to have recurrent chest pain in the future, at that point a cardiac catheterization would be indicated. He was discharged from the hospital on 09/22/15. He returned to [facility] on 09/28/15 with recurrent CP, this time more typical, noting substernal chest pressure. Subsequently, he underwent a LHC by Dr. Fletcher Anon. This showed normal coronary arteries. EF was 55-60%. It was felt that he had hypertensive heart disease, as his BP and LV filling pressures were elevated. Losartan was added to his regimen.   He presents back to clinic today for post hospital f/u. He reports that he has done well. He denies any recurrent CP. His only complaint was intolerance to both Coreg and Losartan. He was seen by his PCP, Dr. Kenton Kingfisher, several days ago and decision was made to discontinue Coreg and to continue Losartan. He feels better after this change. Less fatigue. His BP remains well controlled at 128/86. He notes his PCP checked a BMP last week to check renal function and K. We will try to obtain these records.     Current Outpatient Prescriptions  Medication Sig  Dispense Refill  . aspirin EC 81 MG EC tablet Take 1 tablet (81 mg total) by mouth daily.    Marland Kitchen atorvastatin (LIPITOR) 20 MG tablet Take 1 tablet (20 mg total) by mouth daily at 6 PM. 30 tablet 0  . losartan (COZAAR) 50 MG tablet Take 1 tablet (50 mg total) by mouth daily. 30 tablet 3  . nitroGLYCERIN (NITROSTAT) 0.4 MG SL tablet Place 1 tablet (0.4 mg total) under the tongue every 5 (five) minutes as needed for chest pain. 30 tablet 3  . ranitidine (ZANTAC) 150 MG tablet Take 150 mg by mouth 2 (two) times daily.  0  . TESTIM 50 MG/5GM (1%) GEL Apply 5 g topically daily.  5  . tiZANidine (ZANAFLEX) 4 MG tablet Take 4 mg by mouth at bedtime as needed. (muscle spasms)  3  . zolpidem (AMBIEN) 10 MG tablet Take 10 mg by mouth at bedtime as needed for sleep.   3   No current facility-administered medications for this visit.    Allergies  Allergen Reactions  . Oxycodone-Acetaminophen Other (See Comments)    Nausea    Social History   Social History  . Marital Status: Married    Spouse Name: N/A  . Number of Children: N/A  . Years of Education: N/A   Occupational History  . Not on file.   Social History Main Topics  . Smoking status: Never Smoker   . Smokeless tobacco: Former Systems developer    Types: Chew    Quit date: 09/16/2001  . Alcohol Use: Yes  Comment: 09/28/2014 "couple beers/month"  . Drug Use: Yes    Special: Marijuana     Comment: "quit in the 1990s"  . Sexual Activity: Yes   Other Topics Concern  . Not on file   Social History Narrative     Review of Systems: General: negative for chills, fever, night sweats or weight changes.  Cardiovascular: negative for chest pain, dyspnea on exertion, edema, orthopnea, palpitations, paroxysmal nocturnal dyspnea or shortness of breath Dermatological: negative for rash Respiratory: negative for cough or wheezing Urologic: negative for hematuria Abdominal: negative for nausea, vomiting, diarrhea, bright red blood per rectum, melena,  or hematemesis Neurologic: negative for visual changes, syncope, or dizziness All other systems reviewed and are otherwise negative except as noted above.    Blood pressure 124/86, pulse 73, height 5\' 7"  (1.702 m), weight 263 lb (119.296 kg), SpO2 97 %.  General appearance: alert, cooperative and no distress Neck: no carotid bruit and no JVD Lungs: clear to auscultation bilaterally Heart: regular rate and rhythm, S1, S2 normal, no murmur, click, rub or gallop Extremities: no LEE Pulses: 2+ and symmetric Skin: warm and dry Neurologic: Grossly normal  EKG not performed.  ASSESSMENT AND PLAN:   1. Chest Pain: recent LHC showed normal coronaries and normal LVEF. He does have G1DD and LVH by echo. LHC suggested elevated LV filling pressures.  Suspect hypertensive heart disease. We dicussed the importance of BP control and the effects of untreated HTN on the heart. His BP is currently well controlled on current regimen. Volume is stable. Continue ASA and statin for primary prevention of CAD.   2. HTN: well controlled on current regimen. Will attempt to obtain recent BMP from PCP to ensure renal function and K is stable after starting Losartan. We discussed heart healthy diet, low sodium/ low fat + physical activity.   3. HLD: recent LDL was 125 mg/dL. Goal is <100 mg/dL. Continue Lipitor. Recheck FLP and HFTs in 4-6 weeks. If not at goal, can further increase Lipitor to 40 mg. Low fat diet + regular exercise recommended.   PLAN  F/u with Dr. [name] in 6 months.   Lyda Jester PA-C 10/10/2015 4:46 PM

## 2016-08-30 ENCOUNTER — Telehealth: Payer: Self-pay

## 2016-08-30 DIAGNOSIS — R0789 Other chest pain: Secondary | ICD-10-CM | POA: Diagnosis not present

## 2016-08-30 DIAGNOSIS — E119 Type 2 diabetes mellitus without complications: Secondary | ICD-10-CM | POA: Diagnosis not present

## 2016-08-30 DIAGNOSIS — E291 Testicular hypofunction: Secondary | ICD-10-CM | POA: Diagnosis not present

## 2016-08-30 NOTE — Telephone Encounter (Signed)
SENT TO SCHEDULING 

## 2016-09-10 ENCOUNTER — Ambulatory Visit: Payer: 59 | Admitting: Cardiology

## 2016-10-01 ENCOUNTER — Encounter: Payer: Self-pay | Admitting: Cardiology

## 2016-10-01 ENCOUNTER — Ambulatory Visit (INDEPENDENT_AMBULATORY_CARE_PROVIDER_SITE_OTHER): Payer: 59 | Admitting: Cardiology

## 2016-10-01 VITALS — BP 140/84 | HR 66 | Ht 67.0 in | Wt 265.8 lb

## 2016-10-01 DIAGNOSIS — I10 Essential (primary) hypertension: Secondary | ICD-10-CM

## 2016-10-01 MED ORDER — PANTOPRAZOLE SODIUM 40 MG PO TBEC: 40.0000 mg | DELAYED_RELEASE_TABLET | Freq: Every day | ORAL | 0 refills | Status: DC

## 2016-10-01 NOTE — Progress Notes (Signed)
10/01/2016 Joshua Stanton   1960/09/23  [id number]  Primary Physician [name], MD Primary Cardiologist: Dr. [name]    Reason for Visit/CC: Atypical Chest Pain  HPI:  The patient is a 56 y/o male, followed by Dr. [name]. He first established care with our practice in March 2017, when he presented to [facility] with CP, abnormal EKG and HTN. His EKG was abnormal with diffuse ST depressions. He initially underwent a NST, given the atypical nature of his CP, that showed no ischemia. However, given persistent pain and EKG findings, definitive cath was recommended. He underwent a LHC by Dr. Fletcher Anon. This showed normal coronary arteries. EF was 55-60%. It was felt that he had hypertensive heart disease, as his BP and LV filling pressures were elevated. Losartan was added to his regimen.   He presents back to clinic today again with atypical CP. He notes substernal chest pressure, more noticable after meals. Last only a few seconds at a time. No exertional CP but he does have exertional dyspnea, which he relates to probable deconditioning. He is 265 lb and does not work out regularly, but he is planing on increasing his physical activity.   His EKG shows continued diffuse TWIs, unchanged from prior. BP is 140/84. HR is 66 bpm. He is currently CP free. He reports that he did see his PCP, Dr. Kenton Kingfisher, who added Protonix. Pt reports this worked initially, however he only took if for 1 week then stopped.    Current Meds  Medication Sig  . aspirin EC 81 MG EC tablet Take 1 tablet (81 mg total) by mouth daily.  Marland Kitchen atorvastatin (LIPITOR) 20 MG tablet Take 1 tablet (20 mg total) by mouth daily at 6 PM.  . LORazepam (ATIVAN) 0.5 MG tablet Take 0.5 mg by mouth as needed.  Marland Kitchen losartan (COZAAR) 50 MG tablet Take 1 tablet (50 mg total) by mouth daily.  . nitroGLYCERIN (NITROSTAT) 0.4 MG SL tablet Place 1 tablet (0.4 mg total) under the tongue every 5 (five) minutes as needed for chest pain.  . ranitidine  (ZANTAC) 150 MG tablet Take 150 mg by mouth 2 (two) times daily.  . TESTIM 50 MG/5GM (1%) GEL Apply 5 g topically daily.  Marland Kitchen tiZANidine (ZANAFLEX) 4 MG tablet Take 4 mg by mouth at bedtime as needed. (muscle spasms)  . zolpidem (AMBIEN) 10 MG tablet Take 10 mg by mouth at bedtime as needed for sleep.    Allergies  Allergen Reactions  . Oxycodone-Acetaminophen Other (See Comments)    Nausea   Past Medical History:  Diagnosis Date  . Arthritis    "lower back" (09/28/2015)  . Atypical angina ([place]) dx'd 09/2015  . GERD (gastroesophageal reflux disease)   . High cholesterol   . History of hiatal hernia   . Hypertension   . OSA on CPAP   . Prediabetes   . Seasonal allergies    Family History  Problem Relation Age of Onset  . Hypertension Mother   . Dementia Mother    Past Surgical History:  Procedure Laterality Date  . ANKLE HARDWARE REMOVAL Left 2006   left   . CARDIAC CATHETERIZATION N/A 09/28/2015   Procedure: Left Heart Cath and Coronary Angiography;  Surgeon: Wellington Hampshire, MD;  Location: [place] [facility];  Service: Cardiovascular;  Laterality: N/A;  . FRACTURE SURGERY    . KNEE ARTHROSCOPY  09/20/2011   Procedure: ARTHROSCOPY KNEE;  Surgeon: Gearlean Alf, MD;  Location: [facility];  Service:  Orthopedics;  Laterality: Right;  Medial meniscal DEBRIDEMENT with chondroplasty, right knee  . KNEE ARTHROSCOPY Left 04/07/2013   Procedure: LEFT KNEE ARTHROSCOPY WITH MEDIAL MENISCUS  DEBRIDEMENT ;  Surgeon: Gearlean Alf, MD;  Location: [facility];  Service: Orthopedics;  Laterality: Left;  . LIPOMA EXCISION Right 2004   neck  . NASAL SEPTUM SURGERY  1992  . ORIF ANKLE FRACTURE Left 1981  . TONSILLECTOMY  2008  . UVULOPALATOPHARYNGOPLASTY  2008   Social History   Social History  . Marital status: Married    Spouse name: N/A  . Number of children: N/A  . Years of education: N/A   Occupational History  . Not on file.   Social History Main Topics  . Smoking  status: Never Smoker  . Smokeless tobacco: Former Systems developer    Types: [place] date: 09/16/2001  . Alcohol use Yes     Comment: 09/28/2014 "couple beers/month"  . Drug use: Yes    Types: Marijuana     Comment: "quit in the 1990s"  . Sexual activity: Yes   Other Topics Concern  . Not on file   Social History Narrative  . No narrative on file     Review of Systems: General: negative for chills, fever, night sweats or weight changes.  Cardiovascular: negative for chest pain, dyspnea on exertion, edema, orthopnea, palpitations, paroxysmal nocturnal dyspnea or shortness of breath Dermatological: negative for rash Respiratory: negative for cough or wheezing Urologic: negative for hematuria Abdominal: negative for nausea, vomiting, diarrhea, bright red blood per rectum, melena, or hematemesis Neurologic: negative for visual changes, syncope, or dizziness All other systems reviewed and are otherwise negative except as noted above.   Physical Exam:  Blood pressure 140/84, pulse 66, height 5\' 7"  (1.702 m), weight 265 lb 12.8 oz (120.6 kg).  General appearance: alert, cooperative, no distress and moderately obese Neck: no carotid bruit and no JVD Lungs: clear to auscultation bilaterally Heart: regular rate and rhythm, S1, S2 normal, no murmur, click, rub or gallop Extremities: extremities normal, atraumatic, no cyanosis or edema Pulses: 2+ and symmetric Skin: Skin color, texture, turgor normal. No rashes or lesions Neurologic: Grossly normal  EKG NSR. With diffuse TWIs, seen on prior EKGs. Unchanged.   ASSESSMENT AND PLAN:    1. Atypical CP: pt had recent [facility] 09/2015 that showed normal coronaries and normal LVEF. His recent CP is atypical and c/w with GERD. His PCP placed him on Protonix, which helped symptoms however patient only took his PPI x 1 week. I have recommended that he resume this to compete a 4 week course. We will restart Protonix 40 mg daily x 4 weeks. F/u with Dr. Kenton Kingfisher.  Continue control or cardiac risk factors for primary prevention of CAD. F/u with Dr. Kenton Kingfisher.   2. HTN: controlled. Pt instructed to monitor BP at home closely. He can f/u with Dr. Kenton Kingfisher if elevated home readings. Losartan can be further increased to 100 mg daily. Pt plans on increasing physical activity to help with BP control.    PLAN  Resume PPI. F/u with PCP for atypical CP. PCP to continue to manage cardiac risk factors. We discussed importance of weight loss/ increasing physical activity.  Follow-up in our practice PRN.   Lyda Jester PA-C 10/01/2016 4:34 PM

## 2016-10-01 NOTE — Patient Instructions (Signed)
Medication Instructions:  Please start Protonix 40 mg a day. Continue all other medications as listed.  Follow-Up: Follow up with PCP. Follow as needed with Dr [name].  Thank you for choosing [facility]!!

## 2016-11-21 DIAGNOSIS — R110 Nausea: Secondary | ICD-10-CM | POA: Diagnosis not present

## 2016-11-21 DIAGNOSIS — R5383 Other fatigue: Secondary | ICD-10-CM | POA: Diagnosis not present

## 2016-11-29 DIAGNOSIS — E7800 Pure hypercholesterolemia, unspecified: Secondary | ICD-10-CM | POA: Diagnosis not present

## 2016-11-29 DIAGNOSIS — E119 Type 2 diabetes mellitus without complications: Secondary | ICD-10-CM | POA: Diagnosis not present

## 2016-11-29 DIAGNOSIS — I10 Essential (primary) hypertension: Secondary | ICD-10-CM | POA: Diagnosis not present

## 2017-01-07 DIAGNOSIS — E7800 Pure hypercholesterolemia, unspecified: Secondary | ICD-10-CM | POA: Diagnosis not present

## 2017-01-07 DIAGNOSIS — E291 Testicular hypofunction: Secondary | ICD-10-CM | POA: Diagnosis not present

## 2017-01-08 DIAGNOSIS — M25562 Pain in left knee: Secondary | ICD-10-CM | POA: Diagnosis not present

## 2017-01-16 IMAGING — DX DG CHEST 2V
2 series · 2 of 2 positions shown · non-contrast
Comparison: Chest x-rays dated 09/20/2015 and 04/01/2013.

CLINICAL DATA: Chest pressure with shortness of breath onset today.

EXAM:
CHEST  2 VIEW

[w chest pa]
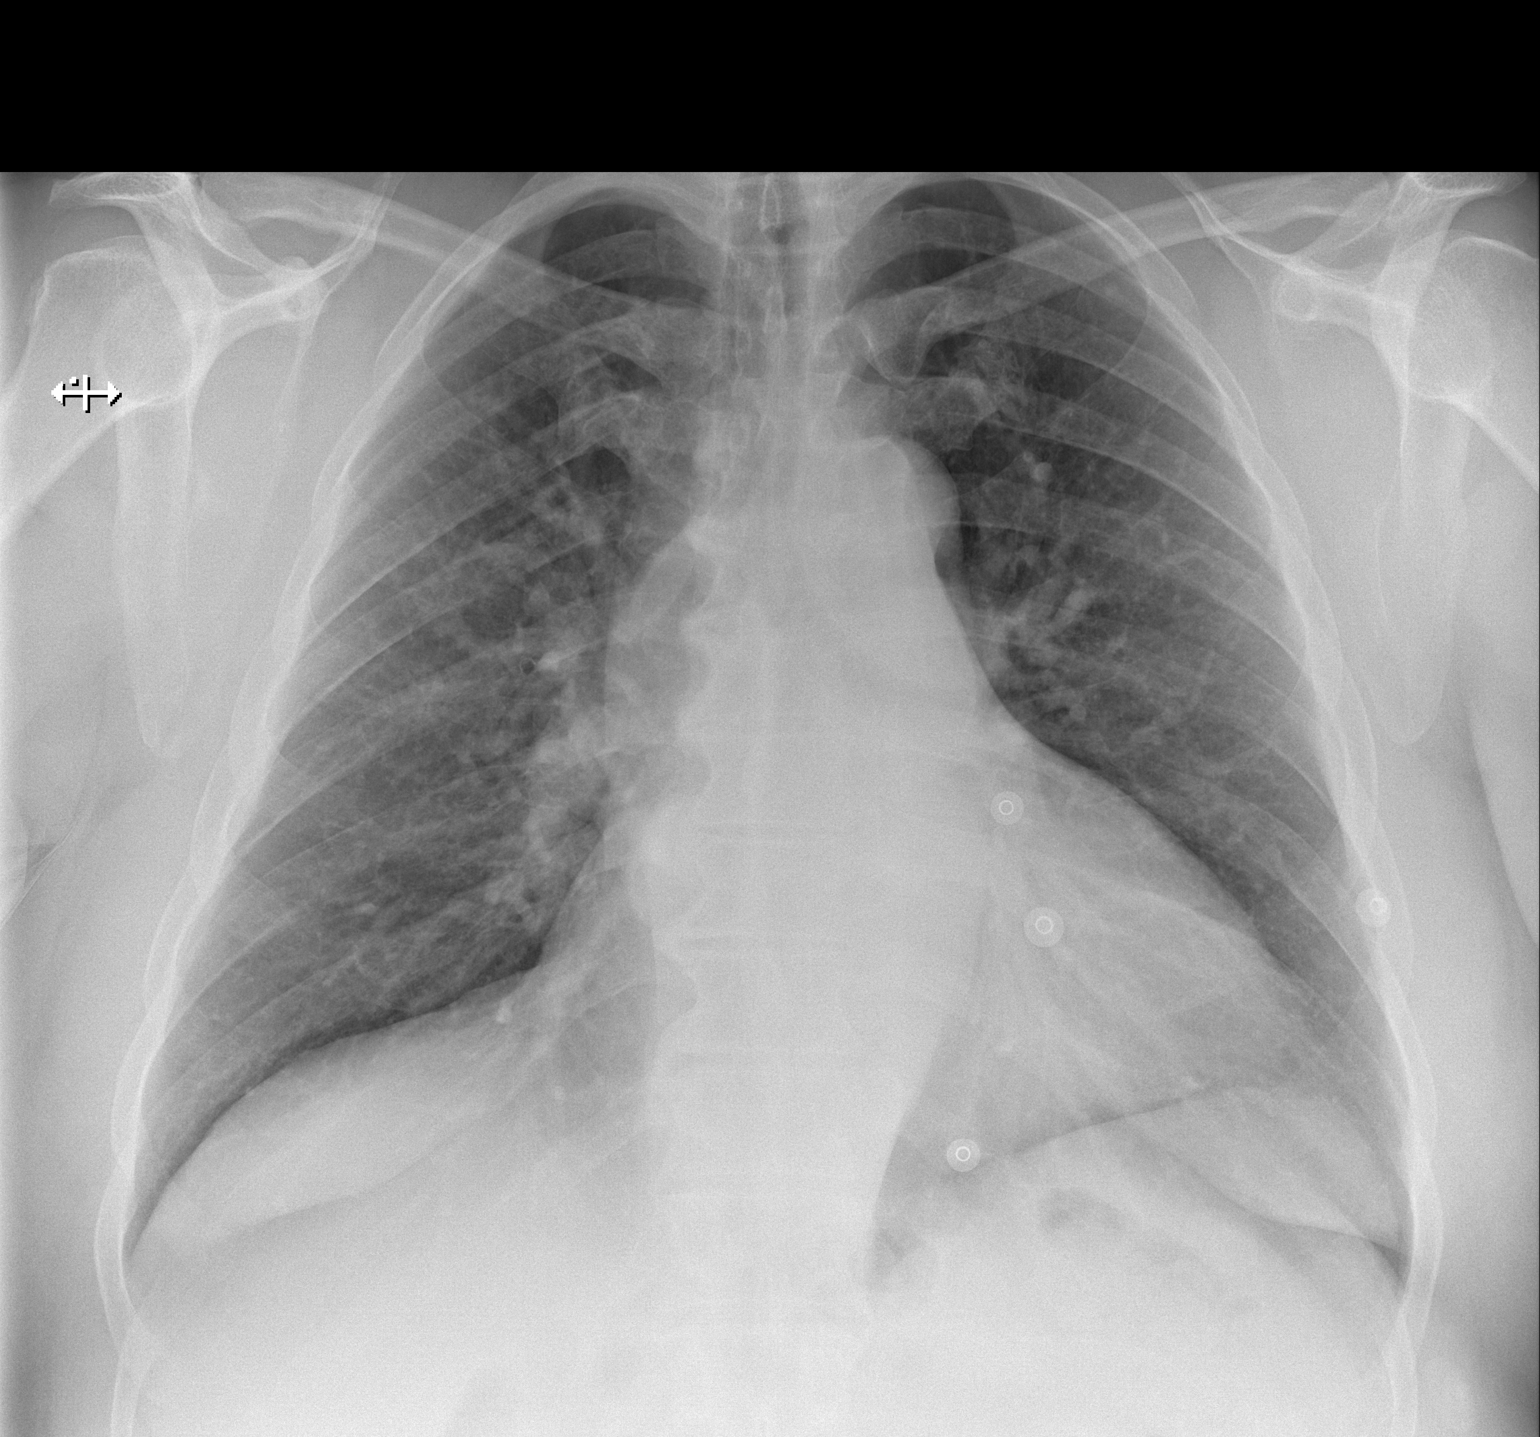

[w chest lat]
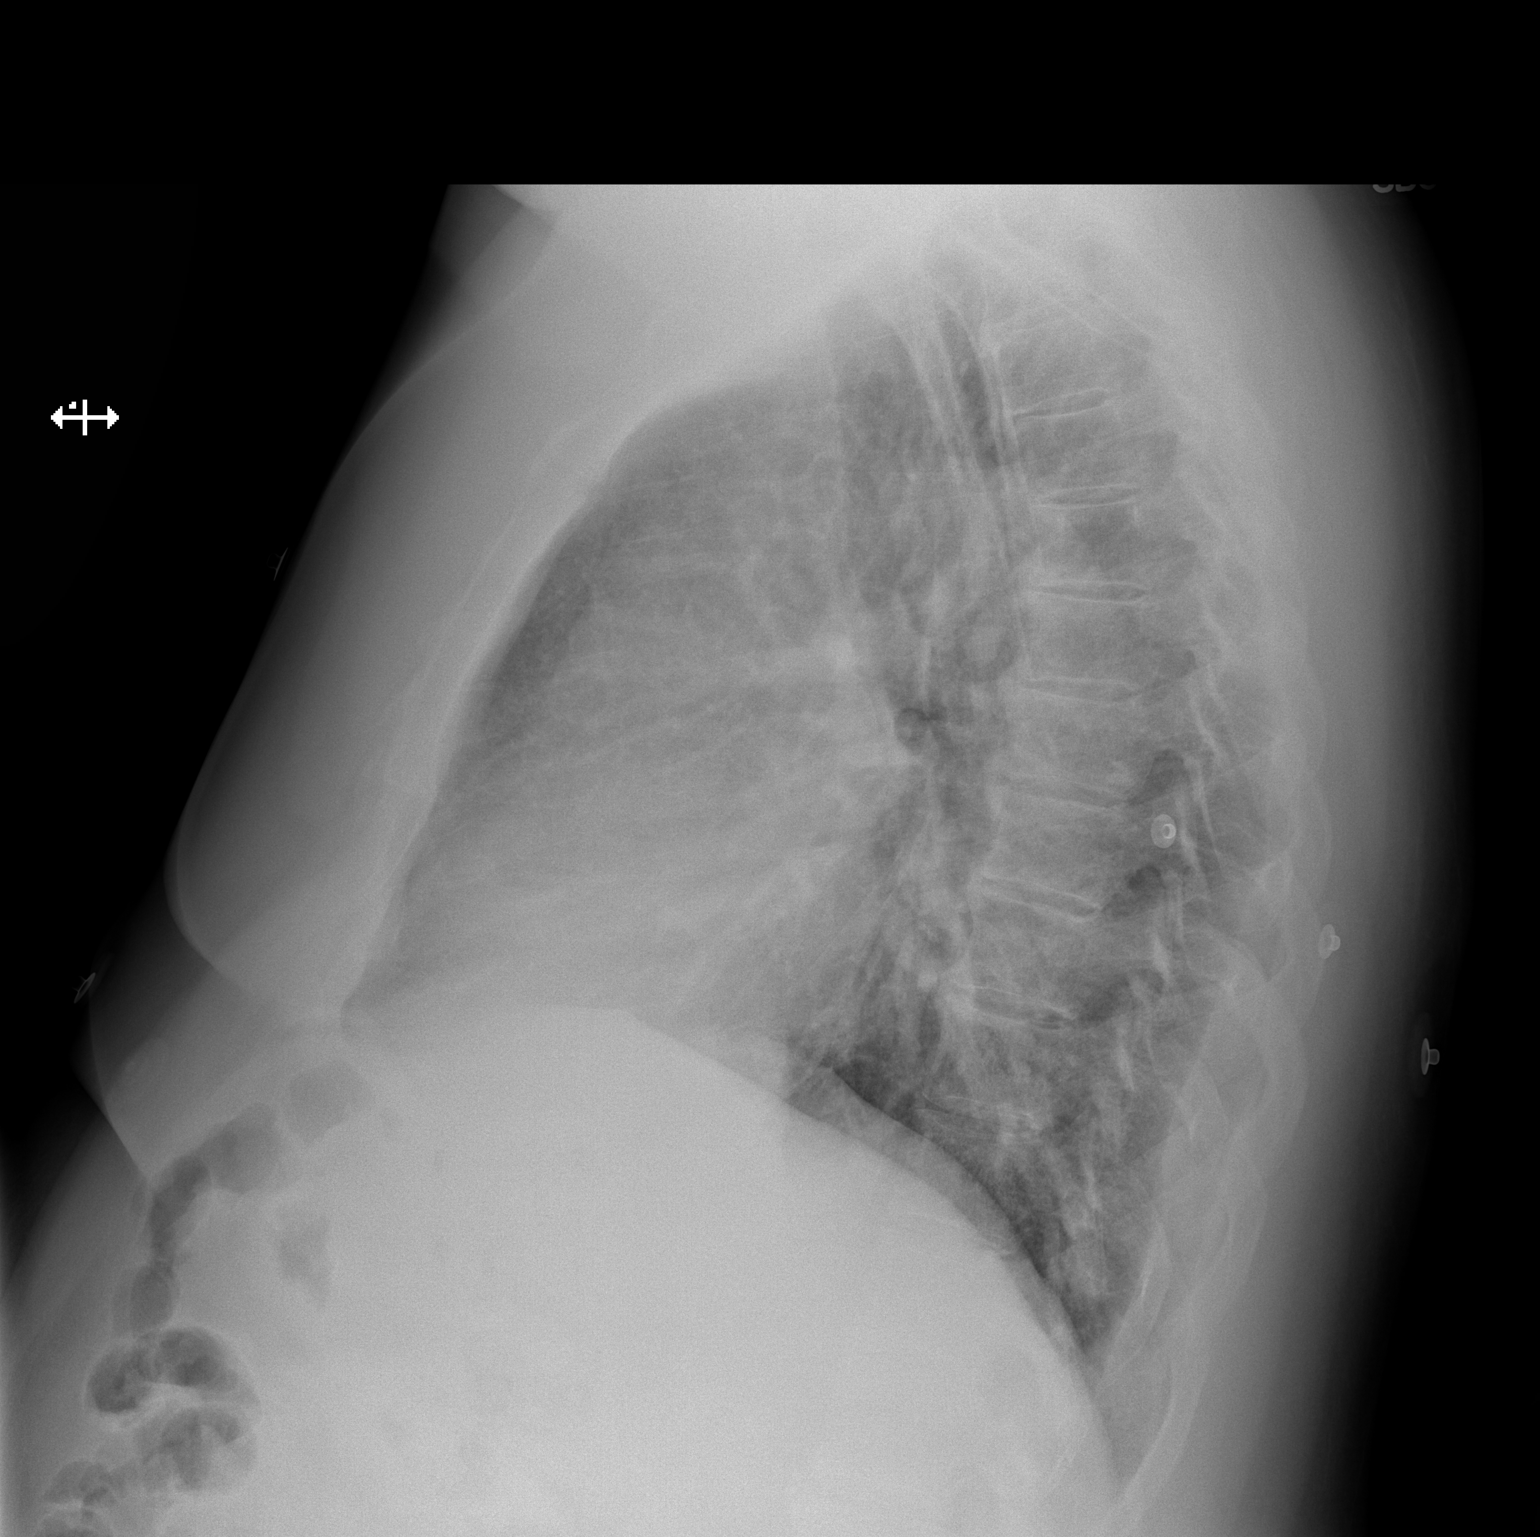

[2 of 2 positions shown; findings below may reference images not displayed]

FINDINGS: Mild cardiomegaly is stable. Overall cardiomediastinal silhouette is
stable in size and configuration.

There is a chronic mild interstitial prominence which is also
stable. Lungs otherwise clear. No evidence of pneumonia. No pleural
effusion. No pneumothorax seen.

Osseous structures about the chest are unremarkable.
IMPRESSION: No active cardiopulmonary disease.

## 2017-03-19 DIAGNOSIS — G4733 Obstructive sleep apnea (adult) (pediatric): Secondary | ICD-10-CM | POA: Diagnosis not present

## 2017-04-11 DIAGNOSIS — H5213 Myopia, bilateral: Secondary | ICD-10-CM | POA: Diagnosis not present

## 2017-05-14 ENCOUNTER — Emergency Department (HOSPITAL_COMMUNITY): Payer: 59

## 2017-05-14 ENCOUNTER — Emergency Department (HOSPITAL_COMMUNITY)
Admission: EM | Admit: 2017-05-14 | Discharge: 2017-05-14 | Disposition: A | Discharge status: Home / Self Care | Payer: 59 | Attending: Emergency Medicine | Admitting: Emergency Medicine

## 2017-05-14 DIAGNOSIS — R0609 Other forms of dyspnea: Secondary | ICD-10-CM | POA: Diagnosis not present

## 2017-05-14 DIAGNOSIS — R0789 Other chest pain: Secondary | ICD-10-CM | POA: Diagnosis not present

## 2017-05-14 DIAGNOSIS — I10 Essential (primary) hypertension: Secondary | ICD-10-CM | POA: Diagnosis not present

## 2017-05-14 DIAGNOSIS — R0602 Shortness of breath: Secondary | ICD-10-CM

## 2017-05-14 DIAGNOSIS — R42 Dizziness and giddiness: Secondary | ICD-10-CM | POA: Insufficient documentation

## 2017-05-14 DIAGNOSIS — Z79899 Other long term (current) drug therapy: Secondary | ICD-10-CM | POA: Diagnosis not present

## 2017-05-14 DIAGNOSIS — E7800 Pure hypercholesterolemia, unspecified: Secondary | ICD-10-CM | POA: Insufficient documentation

## 2017-05-14 DIAGNOSIS — Z87891 Personal history of nicotine dependence: Secondary | ICD-10-CM | POA: Diagnosis not present

## 2017-05-14 DIAGNOSIS — R404 Transient alteration of awareness: Secondary | ICD-10-CM | POA: Diagnosis not present

## 2017-05-14 DIAGNOSIS — R0989 Other specified symptoms and signs involving the circulatory and respiratory systems: Secondary | ICD-10-CM | POA: Diagnosis not present

## 2017-05-14 DIAGNOSIS — R350 Frequency of micturition: Secondary | ICD-10-CM | POA: Diagnosis not present

## 2017-05-14 DIAGNOSIS — Z8719 Personal history of other diseases of the digestive system: Secondary | ICD-10-CM

## 2017-05-14 LAB — CBC WITH DIFFERENTIAL/PLATELET
BASOS PCT: 0 %
Basophils Absolute: 0 10*3/uL (ref 0.0–0.1)
EOS ABS: 0.1 10*3/uL (ref 0.0–0.7)
Eosinophils Relative: 2 %
HEMATOCRIT: 47.3 % (ref 39.0–52.0)
HEMOGLOBIN: 16.1 g/dL (ref 13.0–17.0)
Lymphocytes Relative: 25 %
Lymphs Abs: 2.1 10*3/uL (ref 0.7–4.0)
MCH: 30.2 pg (ref 26.0–34.0)
MCHC: 34 g/dL (ref 30.0–36.0)
MCV: 88.7 fL (ref 78.0–100.0)
Monocytes Absolute: 0.4 10*3/uL (ref 0.1–1.0)
Monocytes Relative: 5 %
NEUTROS ABS: 5.5 10*3/uL (ref 1.7–7.7)
NEUTROS PCT: 68 %
PLATELETS: 224 10*3/uL (ref 150–400)
RBC: 5.33 MIL/uL (ref 4.22–5.81)
RDW: 13 % (ref 11.5–15.5)
WBC: 8.1 10*3/uL (ref 4.0–10.5)

## 2017-05-14 LAB — COMPREHENSIVE METABOLIC PANEL
ALBUMIN: 4 g/dL (ref 3.5–5.0)
ALK PHOS: 75 U/L (ref 38–126)
ALT: 41 U/L (ref 17–63)
ANION GAP: 6 (ref 5–15)
AST: 31 U/L (ref 15–41)
BILIRUBIN TOTAL: 0.7 mg/dL (ref 0.3–1.2)
BUN: 9 mg/dL (ref 6–20)
CALCIUM: 9.1 mg/dL (ref 8.9–10.3)
CO2: 30 mmol/L (ref 22–32)
Chloride: 105 mmol/L (ref 101–111)
Creatinine, Ser: 1.11 mg/dL (ref 0.61–1.24)
GFR calc non Af Amer: >60 mL/min (ref >60)
GLUCOSE: 106 mg/dL — AB (ref 65–99)
Potassium: 3.6 mmol/L (ref 3.5–5.1)
Sodium: 141 mmol/L (ref 135–145)
TOTAL PROTEIN: 7.5 g/dL (ref 6.5–8.1)

## 2017-05-14 LAB — I-STAT TROPONIN, ED
TROPONIN I, POC: 0.02 ng/mL (ref 0.00–0.08)
Troponin i, poc: 0.02 ng/mL (ref 0.00–0.08)

## 2017-05-14 LAB — BRAIN NATRIURETIC PEPTIDE: B Natriuretic Peptide: 34.1 pg/mL (ref 0.0–100.0)

## 2017-05-14 MED ORDER — ASPIRIN 81 MG PO CHEW
243.0000 mg | CHEWABLE_TABLET | Freq: Once | ORAL | Status: AC
Start: 2017-05-14 — End: 2017-05-14
  Administered 2017-05-14: 243 mg via ORAL
  Filled 2017-05-14: qty 3

## 2017-05-14 MED ORDER — GI COCKTAIL ~~LOC~~
30.0000 mL | Freq: Once | ORAL | Status: AC
  Administered 2017-05-14: 30 mL via ORAL
  Filled 2017-05-14: qty 30

## 2017-05-14 NOTE — ED Notes (Signed)
Pt verbalize understanding of discharge paperwork. All questions answered. Pt A&Ox4 and ambulatory at discharge.

## 2017-05-14 NOTE — ED Provider Notes (Signed)
[facility] EMERGENCY DEPARTMENT Provider Note   CSN: [id number] Arrival date & time: 05/14/17  1219     History   Chief Complaint Chief Complaint  Patient presents with  . Dizziness    HPI Joshua Stanton is a 56 y.o. male with a PMHx of atypical angina with reassuring LHC (normal coronaries, elevated LV filling pressures c/w HTN cardiac disease), reassuring Echo (EF 99-24%, grade 1 diastolic dysfunction), and low-risk stress testing in 09/2015, GERD, HLD, HTN, hiatal hernia, low back pain, OSA on CPAP, prediabetes, and other conditions listed below, who presents to the ED via EMS from his PCP's office (Dr. Azalia Bilis @ [place]), with complaints of "not feeling well" over the weekend. He states that on Saturday, 4 days ago, he was coming up his staircase when he got lightheaded for a few seconds, and has had intermittent lightheadedness since then, typically when he walks or exerts himself. He used Ativan over the weekend which helped some with his symptoms, and rest seems to also help. He has had associated fatigue, shortness of breath with exertion, nausea, and central chest pain that he describes as a pressure-like sensation which is intermittent and feels the exact same way his reflux does, usually occurring after meals. He had similar symptoms in the past, last year, which is when he underwent cardiac cath, echo, and stress testing which were all ultimately reassuring. He last saw his [facility] cardiology provider (main cardiologist is Dr. [name], but he usually sees Ellen Henri PA-C, which was the provider he just saw) on 10/01/16, who mentioned that his CP is c/w GERD, recommended continuation of PPI, and recommended adequate HTN control, including increasing losartan to 100mg  daily if BP control not adequate on 50mg  daily. He states he took his losartan today, although he is not sure exactly many milligrams he takes, states he takes "1 little green pill" (assumed  to be the 50mg  tablet he's rx'd currently). He also took 81 mg aspirin and his Lipitor, as well as all other home medications. Admits that he's "not been working out as much as he should".   He denies diaphoresis, fevers, chills, cough, LE swelling, recent travel/surgery/immobilization, personal/family hx of DVT/PE, abd pain, V/D/C, hematuria, dysuria, myalgias, arthralgias, claudication, orthopnea, numbness, tingling, focal weakness, or any other complaints at this time. Non-Smoker. No known FHx of cardiac disease.    The history is provided by the patient and medical records. No language interpreter was used.  Dizziness  Quality:  Lightheadedness Severity:  Mild Onset quality:  Gradual Duration:  4 days Timing:  Intermittent Progression:  Waxing and waning Chronicity:  Recurrent Context: physical activity   Relieved by: rest. Exacerbated by: going up stairs, exertion. Ineffective treatments:  None tried Associated symptoms: chest pain (intermittent), nausea and shortness of breath (with exertion)   Associated symptoms: no diarrhea, no vomiting and no weakness     Past Medical History:  Diagnosis Date  . Arthritis    "lower back" (09/28/2015)  . Atypical angina ([place]) dx'd 09/2015  . GERD (gastroesophageal reflux disease)   . High cholesterol   . History of hiatal hernia   . Hypertension   . OSA on CPAP   . Prediabetes   . Seasonal allergies     Patient Active Problem List   Diagnosis Date Noted  . Pain in the chest   . Essential hypertension 09/21/2015  . Obstructive sleep apnea 09/21/2015  . New-onset angina ([place]) 09/21/2015  . Abnormal finding on EKG -  anterolateral biphasic T waves with ST depression 09/21/2015  . Elevated troponin 09/21/2015  . Atypical angina ([place]) 09/20/2015  . Chest pain 09/20/2015  . Acute medial meniscal tear 04/06/2013    Past Surgical History:  Procedure Laterality Date  . ANKLE HARDWARE REMOVAL Left 2006   left   . CARDIAC  CATHETERIZATION N/A 09/28/2015   Procedure: Left Heart Cath and Coronary Angiography;  Surgeon: Wellington Hampshire, MD;  Location: [facility];  Service: Cardiovascular;  Laterality: N/A;  . FRACTURE SURGERY    . KNEE ARTHROSCOPY  09/20/2011   Procedure: ARTHROSCOPY KNEE;  Surgeon: Gearlean Alf, MD;  Location: [facility];  Service: Orthopedics;  Laterality: Right;  Medial meniscal DEBRIDEMENT with chondroplasty, right knee  . KNEE ARTHROSCOPY Left 04/07/2013   Procedure: LEFT KNEE ARTHROSCOPY WITH MEDIAL MENISCUS  DEBRIDEMENT ;  Surgeon: Gearlean Alf, MD;  Location: [facility];  Service: Orthopedics;  Laterality: Left;  . LIPOMA EXCISION Right 2004   neck  . NASAL SEPTUM SURGERY  1992  . ORIF ANKLE FRACTURE Left 1981  . TONSILLECTOMY  2008  . UVULOPALATOPHARYNGOPLASTY  2008       Home Medications    Prior to Admission medications   Medication Sig Start Date End Date Taking? Authorizing Provider  aspirin EC 81 MG EC tablet Take 1 tablet (81 mg total) by mouth daily. 09/22/15   Ghimire, Henreitta Leber, MD  atorvastatin (LIPITOR) 20 MG tablet Take 1 tablet (20 mg total) by mouth daily at 6 PM. 09/22/15   Ghimire, Henreitta Leber, MD  LORazepam (ATIVAN) 0.5 MG tablet Take 0.5 mg by mouth as needed. 08/30/16   [provider]  losartan (COZAAR) 50 MG tablet Take 1 tablet (50 mg total) by mouth daily. 09/29/15   Rai, Vernelle Emerald, MD  nitroGLYCERIN (NITROSTAT) 0.4 MG SL tablet Place 1 tablet (0.4 mg total) under the tongue every 5 (five) minutes as needed for chest pain. 09/29/15   Rai, Ripudeep K, MD  pantoprazole (PROTONIX) 40 MG tablet Take 1 tablet (40 mg total) by mouth daily. 10/01/16   Lyda Jester M, PA-C  ranitidine (ZANTAC) 150 MG tablet Take 150 mg by mouth 2 (two) times daily. 09/22/15   [provider]  TESTIM 50 MG/5GM (1%) GEL Apply 5 g topically daily. 08/30/15   [provider]  tiZANidine (ZANAFLEX) 4 MG tablet Take 4 mg by mouth at bedtime as  needed. (muscle spasms) 10/06/15   [provider]  zolpidem (AMBIEN) 10 MG tablet Take 10 mg by mouth at bedtime as needed for sleep.  08/30/15   [provider]    Family History Family History  Problem Relation Age of Onset  . Hypertension Mother   . Dementia Mother     Social History Social History  Substance Use Topics  . Smoking status: Never Smoker  . Smokeless tobacco: Former Systems developer    Types: [place] date: 09/16/2001  . Alcohol use Yes     Comment: 09/28/2014 "couple beers/month"     Allergies   Oxycodone-acetaminophen   Review of Systems Review of Systems  Constitutional: Positive for fatigue. Negative for chills, diaphoresis and fever.  Respiratory: Positive for shortness of breath (with exertion). Negative for cough.   Cardiovascular: Positive for chest pain (intermittent). Negative for leg swelling.  Gastrointestinal: Positive for nausea. Negative for abdominal pain, constipation, diarrhea and vomiting.  Genitourinary: Negative for dysuria and hematuria.  Musculoskeletal: Negative for arthralgias and myalgias.  Skin: Negative for color change.  Allergic/Immunologic: Negative for immunocompromised state.  Neurological: Positive for dizziness and light-headedness. Negative for weakness and numbness.  Psychiatric/Behavioral: Negative for confusion.   All other systems reviewed and are negative for acute change except as noted in the HPI.    Physical Exam Updated Vital Signs BP (!) 162/62 (BP Location: Left Arm)   Pulse 80   Resp 18   Ht 5\' 7"  (1.702 m)   Wt 120.2 kg (265 lb)   SpO2 94%   BMI 41.50 kg/m   Physical Exam  Constitutional: He is oriented to person, place, and time. Vital signs are normal. He appears well-developed and well-nourished.  Non-toxic appearance. No distress.  Afebrile, nontoxic, NAD, slightly elevated BP 160s/60s, close to prior outpatient readings  HENT:  Head: Normocephalic and atraumatic.  Mouth/Throat:  Oropharynx is clear and moist and mucous membranes are normal.  Eyes: Conjunctivae and EOM are normal. Right eye exhibits no discharge. Left eye exhibits no discharge.  Neck: Normal range of motion. Neck supple.  Cardiovascular: Normal rate, regular rhythm, normal heart sounds and intact distal pulses.  Exam reveals no gallop and no friction rub.   No murmur heard. RRR, nl s1/s2, no m/r/g, distal pulses intact, trace b/l pedal edema   Pulmonary/Chest: Effort normal and breath sounds normal. No respiratory distress. He has no decreased breath sounds. He has no wheezes. He has no rhonchi. He has no rales. He exhibits no tenderness, no crepitus, no deformity and no retraction.  CTAB in all lung fields, no w/r/r, no hypoxia or increased WOB, speaking in full sentences, SpO2 97-99% on RA during evaluation  Chest wall nonTTP without crepitus, deformities, or retractions   Abdominal: Soft. Normal appearance and bowel sounds are normal. He exhibits no distension. There is no tenderness. There is no rigidity, no rebound, no guarding, no CVA tenderness, no tenderness at McBurney's point and negative Murphy's sign.  Soft, NTND, +BS throughout, no r/g/r, neg murphy's, neg mcburney's, no CVA TTP   Musculoskeletal: Normal range of motion.  MAE x4 Strength and sensation grossly intact in all extremities Distal pulses intact Trace b/l pedal edema, neg homan's bilaterally   Neurological: He is alert and oriented to person, place, and time. He has normal strength. No sensory deficit.  Skin: Skin is warm, dry and intact. No rash noted.  Psychiatric: He has a normal mood and affect.  Nursing note and vitals reviewed.  Orthostatic VS for the past 24 hrs:  BP- Lying Pulse- Lying BP- Sitting Pulse- Sitting BP- Standing at 0 minutes Pulse- Standing at 0 minutes  05/14/17 1305 (!) 166/97 72 (!) 179/109 83 (!) 179/110 85     ED Treatments / Results  Labs (all labs ordered are listed, but only abnormal results are  displayed) Labs Reviewed  COMPREHENSIVE METABOLIC PANEL - Abnormal; Notable for the following:       Result Value   Glucose, Bld 106 (*)    All other components within normal limits  CBC WITH DIFFERENTIAL/PLATELET  BRAIN NATRIURETIC PEPTIDE  I-STAT TROPONIN, ED  I-STAT TROPONIN, ED    EKG  EKG Interpretation None       Radiology Dg Chest 2 View  Result Date: 05/14/2017 CLINICAL DATA:  One week of weakness and chest pressure sensation. History of angina pectoris. EXAM: CHEST  2 VIEW COMPARISON:  Chest x-ray of September 27, 2015 FINDINGS: The lungs are adequately inflated. The interstitial markings are coarse but less conspicuous than on the previous study. The  cardiac silhouette is enlarged. The pulmonary vascularity is not engorged. The trachea is midline. There is faint calcification in the wall of the aortic arch. There is multilevel degenerative disc disease of the thoracic spine. IMPRESSION: Mild chronic bronchitic changes. Stable cardiomegaly without significant pulmonary vascular congestion. Thoracic aortic atherosclerosis. Electronically Signed   By: David  Martinique M.D.   On: 05/14/2017 13:27     NucMed Myoview Stress Test 09/22/15: Study Result   T wave inversion was noted during stress.  This is a low risk study.  The left ventricular ejection fraction is moderately decreased (30-44%).  Nuclear stress EF: 44%.   1. Fixed, small mild basal to mid inferolateral defect.  Possible attenuation.  No ischemia.  2. EF 44% with diffuse hypokinesis.  3. Overall low risk study.     LHC 09/28/15: Conclusion   The left ventricular systolic function is normal.   1. Normal coronary arteries. 2. Normal LV systolic function with an ejection fraction of 55-60%. 3. Moderate systemic hypertension and moderately elevated left ventricular end-diastolic pressure.  Recommendations: The patient likely has hypertensive heart disease. Recommend blood pressure control. I added losartan. He  is already on carvedilol.    Echo 10/04/15: Study Conclusions - Left ventricle: The cavity size was normal. There was moderate   concentric hypertrophy. Systolic function was normal. The   estimated ejection fraction was in the range of 60% to 65%. Wall   motion was normal; there were no regional wall motion   abnormalities. Doppler parameters are consistent with abnormal   left ventricular relaxation (grade 1 diastolic dysfunction).   There was no evidence of elevated ventricular filling pressure by   Doppler parameters. - Aortic valve: Trileaflet; normal thickness leaflets. There was no   regurgitation. - Aortic root: The aortic root was normal in size. - Left atrium: The atrium was moderately dilated. - Right ventricle: The cavity size was mildly dilated. Wall   thickness was normal. - Right atrium: The atrium was mildly dilated. - Tricuspid valve: There was mild regurgitation. - Pulmonic valve: There was no regurgitation. - Pulmonary arteries: Systolic pressure was within the normal   range. - Inferior vena cava: The vessel was normal in size.    Procedures Procedures (including critical care time)  Medications Ordered in ED Medications  aspirin chewable tablet 243 mg (243 mg Oral Given 05/14/17 1308)  gi cocktail (Maalox,Lidocaine,Donnatal) (30 mLs Oral Given 05/14/17 1308)     Initial Impression / Assessment and Plan / ED Course  I have reviewed the triage vital signs and the nursing notes.  Pertinent labs & imaging results that were available during my care of the patient were reviewed by me and considered in my medical decision making (see chart for details).     56 y.o. male here with fatigue, intermittent lightheadedness, SOB with exertion, and intermittent CP which he thinks is his GERD, happened over the weekend 4 days ago and has had intermittent symptoms since then. Saw his PCP today and they felt he needed further evaluation. Has had similar issues before,  last year had LHC/Echo/Stress test which were all reassuring, and his symptoms/abnormal EKG were felt to be related to hypertensive heart disease. On exam, no chest wall or abdominal tenderness, trace b/l pedal edema noted, neg homan's sign, no tachycardia or hypoxia, BP slightly elevated at 160s/60s. EKG today with TWI similar to prior, and findings of LVH, but otherwise nonischemic (not crossing over). Will get labs, CXR, and orthostatics; will give ASA 243mg  (since  he already took 81mg ) and give GI cocktail. Will reassess shortly.   2:21 PM CBC w/diff WNL. CMP WNL. First Trop neg. BNP WNL. CXR with mild chronic bronchitic findings and stable cardiomegaly, no increased pulm vasc congestion. Orthostatics negative. Pt feeling better after GI cocktail, no current complaints at this time. Will get second trop at 4pm (3hrs after first) and reassess after.   4:39 PM Second trop negative. BP coming down to 150s/80-90s. Overall reassuring work up today including delta trop neg, and recent reassuring cardiac work up. Doubt need for further emergent work up today. Advised OTC remedies for symptomatic relief of his indigestion, and continuation of home meds. HTN more pronounced here and likely contributing to his symptoms, advised him to start on 100mg  losartan dosing if not already on that; DASH diet advised, and f/up with PCP/Cardiology in 1wk for recheck of symptoms and ongoing HTN management. I explained the diagnosis and have given explicit precautions to return to the ER including for any other new or worsening symptoms. The patient understands and accepts the medical plan as it's been dictated and I have answered their questions. Discharge instructions concerning home care and prescriptions have been given. The patient is STABLE and is discharged to home in good condition.    Final Clinical Impressions(s) / ED Diagnoses   Final diagnoses:  Lightheadedness  Atypical chest pain  Essential hypertension    SOB (shortness of breath) on exertion  Hx of gastroesophageal reflux (GERD)    New Prescriptions New Prescriptions   No medications on [address] [place], [place] 05/14/17 1642    Tanna Furry, MD 05/26/17 [phone]

## 2017-05-14 NOTE — ED Triage Notes (Signed)
Pt arrived  To ED via EMS with complaints of intermittent dizziness. No N/V/D or pain reported. Home physician directed him to Ed. EMS reported change in EKG

## 2017-05-14 NOTE — ED Notes (Signed)
Patient transported to X-ray 

## 2017-05-14 NOTE — Discharge Instructions (Addendum)
Your work up today has been reassuring, your symptoms could from be a variety of things, including your elevated blood pressure. Increase your losartan dose to 100mg  (2 tablets) daily, if you're not already taking that. Eat a low-salt diet. Monitor your blood pressure twice daily, morning and night, in order to bring it to your doctor's office at your next visit. Stay well hydrated. Take all of your usual home medications as directed, including your home protonix. Use over the counter tums/maalox and zantac/pepcid for additional relief of your indigestion. Follow up with your regular doctor and your cardiologist in 5-7 days for recheck of symptoms. Return to the ER for changes or worsening symptoms.  SEEK IMMEDIATE MEDICAL ATTENTION IF: You develop a fever.  Your chest pains become severe or intolerable.  You develop new, unexplained symptoms (problems).  You develop shortness of breath, nausea, vomiting, sweating or feel light headed.  You develop a new cough or you cough up blood. You develop new leg swelling

## 2017-05-21 DIAGNOSIS — Z125 Encounter for screening for malignant neoplasm of prostate: Secondary | ICD-10-CM | POA: Diagnosis not present

## 2017-05-21 DIAGNOSIS — E7800 Pure hypercholesterolemia, unspecified: Secondary | ICD-10-CM | POA: Diagnosis not present

## 2017-05-21 DIAGNOSIS — Z23 Encounter for immunization: Secondary | ICD-10-CM | POA: Diagnosis not present

## 2017-05-21 DIAGNOSIS — E119 Type 2 diabetes mellitus without complications: Secondary | ICD-10-CM | POA: Diagnosis not present

## 2017-05-21 DIAGNOSIS — I10 Essential (primary) hypertension: Secondary | ICD-10-CM | POA: Diagnosis not present

## 2017-05-23 DIAGNOSIS — R000 Tachycardia, unspecified: Secondary | ICD-10-CM | POA: Diagnosis not present

## 2017-06-02 ENCOUNTER — Encounter: Payer: Self-pay | Admitting: Cardiology

## 2017-06-02 ENCOUNTER — Ambulatory Visit: Payer: 59 | Admitting: Cardiology

## 2017-06-02 VITALS — BP 140/86 | HR 84 | Ht 67.0 in | Wt 267.1 lb

## 2017-06-02 DIAGNOSIS — I10 Essential (primary) hypertension: Secondary | ICD-10-CM

## 2017-06-02 NOTE — Progress Notes (Signed)
06/02/2017 Joshua Stanton [facility]   01/19/1961  [id number]  Primary Physician [name], MD Primary Cardiologist: Dr. [name]   Reason for Visit/CC: Post ED F/u for Chest Pain   HPI:  The patient is a 56 y/o male, followed by Dr. [name]. He first established care with our practice in March 2017, when he presented to [facility] with CP, abnormal EKG and HTN. His EKG was abnormal with diffuse ST depressions. He initially underwent a NST, given the atypical nature of his CP, that showed no ischemia. However, given persistent pain and EKG findings, definitive cath was recommended. He underwent a LHC by Dr. Fletcher Anon. This showed normal coronary arteries. EF was 55-60%. It was felt that he had hypertensive heart disease, as his BP and LV filling pressures were elevated. Losartan was added to his regimen.   He presents to clinic today for post ED f/u. He was seen 05/14/17 for chest pain. Cardiac w/u in ED negative. Troponins negative x 2. EKG similar to previous EKG w/ LVH, unchanged from baseline. BP was moderately elevated. Symptoms were felt secondary to GERD and he was given a GI cocktail in ED. He did not require admission. ED MD instructed him to increase is Losartan from 50 mg to 100 mg and to f/u in our office.   He denies any recurrent CP. In hindsight, he also thinks his symptoms several weeks ago were due to acid reflux.  His BP is better controlled today at 140/86 w/ losartan 100. He has had some mild associated fatigue however w/ the higher dose.   Other new issues>> pt had 1 episode of tachy palpitations 2 weeks ago. He called EMS. They checked EKG at his house and was told everything looked ok. Reassurance given. Symptoms resolved spontaneously. He thinks it may have been related to anxiety, which he has a h/o. His PCP has prescribed PRN ativan. He denies any recurrent episodes since that 1 event.     Current Meds  Medication Sig  . amLODipine (NORVASC) 2.5 MG tablet Take 2.5 mg daily  by mouth.  Marland Kitchen aspirin EC 81 MG EC tablet Take 1 tablet (81 mg total) by mouth daily.  Marland Kitchen atorvastatin (LIPITOR) 20 MG tablet Take 1 tablet (20 mg total) by mouth daily at 6 PM.  . LORazepam (ATIVAN) 0.5 MG tablet Take 0.5 mg by mouth as needed.  Marland Kitchen losartan (COZAAR) 100 MG tablet Take 100 mg daily by mouth.  . nitroGLYCERIN (NITROSTAT) 0.4 MG SL tablet Place 1 tablet (0.4 mg total) under the tongue every 5 (five) minutes as needed for chest pain.  . pantoprazole (PROTONIX) 40 MG tablet Take 1 tablet (40 mg total) by mouth daily.  . TESTIM 50 MG/5GM (1%) GEL Apply 5 g topically every other day.   Marland Kitchen tiZANidine (ZANAFLEX) 4 MG tablet Take 4 mg by mouth at bedtime as needed. (muscle spasms)  . zolpidem (AMBIEN) 10 MG tablet Take 10 mg by mouth at bedtime as needed for sleep.    Allergies  Allergen Reactions  . Oxycodone-Acetaminophen Other (See Comments)    Nausea   Past Medical History:  Diagnosis Date  . Arthritis    "lower back" (09/28/2015)  . 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 GERD (gastroesophageal reflux disease)   . High cholesterol   . History of hiatal hernia   . Hypertension   . OSA on CPAP   . Prediabetes   . Seasonal allergies    Family History  Problem Relation Age of Onset  .  Hypertension Mother   . Dementia Mother    Past Surgical History:  Procedure Laterality Date  . ANKLE HARDWARE REMOVAL Left 2006   left   . ARTHROSCOPY KNEE Right 09/20/2011   Performed by Gearlean Alf, MD at [facility]  . FRACTURE SURGERY    . Left Heart Cath and Coronary Angiography N/A 09/28/2015   Performed by Wellington Hampshire, MD at [facility]  . LEFT KNEE ARTHROSCOPY WITH MEDIAL MENISCUS  DEBRIDEMENT Left 04/07/2013   Performed by Gearlean Alf, MD at [facility]  . LIPOMA EXCISION Right 2004   neck  . NASAL SEPTUM SURGERY  1992  . ORIF ANKLE FRACTURE Left 1981  . TONSILLECTOMY  2008  . UVULOPALATOPHARYNGOPLASTY  2008   Social History   Socioeconomic  History  . Marital status: Married    Spouse name: Not on file  . Number of children: Not on file  . Years of education: Not on file  . Highest education level: Not on file  Social Needs  . Financial resource strain: Not on file  . Food insecurity - worry: Not on file  . Food insecurity - inability: Not on file  . Transportation needs - medical: Not on file  . Transportation needs - non-medical: Not on file  Occupational History  . Not on file  Tobacco Use  . Smoking status: Never Smoker  . Smokeless tobacco: Former Systems developer    Types: Chew  Substance and Sexual Activity  . Alcohol use: Yes    Comment: 09/28/2014 "couple beers/month"  . Drug use: Yes    Types: Marijuana    Comment: "quit in the 1990s"  . Sexual activity: Yes  Other Topics Concern  . Not on file  Social History Narrative  . Not on file     Review of Systems: General: negative for chills, fever, night sweats or weight changes.  Cardiovascular: negative for chest pain, dyspnea on exertion, edema, orthopnea, palpitations, paroxysmal nocturnal dyspnea or shortness of breath Dermatological: negative for rash Respiratory: negative for cough or wheezing Urologic: negative for hematuria Abdominal: negative for nausea, vomiting, diarrhea, bright red blood per rectum, melena, or hematemesis Neurologic: negative for visual changes, syncope, or dizziness All other systems reviewed and are otherwise negative except as noted above.   Physical Exam:  Blood pressure 140/86, pulse 84, height 5\' 7"  (1.702 m), weight 267 lb 1.9 oz (121.2 kg), SpO2 96 %.  General appearance: alert, cooperative and no distress Neck: no carotid bruit and no JVD Lungs: clear to auscultation bilaterally Heart: regular rate and rhythm, S1, S2 normal, no murmur, click, rub or gallop Extremities: extremities normal, atraumatic, no cyanosis or edema Pulses: 2+ and symmetric Skin: Skin color, texture, turgor normal. No rashes or lesions Neurologic:  Grossly normal  EKG not preformed -- personally reviewed  2D Echo 10/04/15 Study Conclusions  - Left ventricle: The cavity size was normal. There was moderate   concentric hypertrophy. Systolic function was normal. The   estimated ejection fraction was in the range of 60% to 65%. Wall   motion was normal; there were no regional wall motion   abnormalities. Doppler parameters are consistent with abnormal   left ventricular relaxation (grade 1 diastolic dysfunction).   There was no evidence of elevated ventricular filling pressure by   Doppler parameters. - Aortic valve: Trileaflet; normal thickness leaflets. There was no   regurgitation. - Aortic root: The aortic root was normal in size. - Left atrium: The  atrium was moderately dilated. - Right ventricle: The cavity size was mildly dilated. Wall   thickness was normal. - Right atrium: The atrium was mildly dilated. - Tricuspid valve: There was mild regurgitation. - Pulmonic valve: There was no regurgitation. - Pulmonary arteries: Systolic pressure was within the normal   range. - Inferior vena cava: The vessel was normal in size.  LHC 09/28/2015 Procedures   Left Heart Cath and Coronary Angiography  Conclusion    The left ventricular systolic function is normal.   1. Normal coronary arteries. 2. Normal LV systolic function with an ejection fraction of 55-60%. 3. Moderate systemic hypertension and moderately elevated left ventricular end-diastolic pressure.      ASSESSMENT AND PLAN:   1. Atypical Chest Pain: recent symptoms c/w GERD. Seen in the ED. Ruled out for MI with negative troponin's x 2. EKG uncharged and consisted with previous. He underwent extensive cardiac w/u in 2017, including LHC which showed normal coronaries. I agree that his symptoms are likely GI related. He has had no recurrence.   2. HTN: BP controlled today. Losartan recently increased from 50 mg to 100 mg. He has had some mild fatigue since starting  the higher dose. Pt advised to try taking 50 mg in the morning and 50 mg at night to see how he tolerates this. Continue to monitor at home. He can f/u with [place] or his PCP if any further issues. Pt advised target BP ~130/80.   3. Palpitations: 1 episode 2 weeks ago that resolved spontaneously. He was evaluated by EMS and had an EKG and was told he was ok. No further recurrence. He thinks it may have been anxiety. His PCP gives him PRN ativan for this. He denies any further recurrence. If he has recurrent symptoms, we can consider outpatient monitor. Otherwise pt instructed to stay well hydrated and limit caffeine.    Follow-Up in 6 months with Dr. [name].   Brittainy Ladoris Gene, MHS [facility] 06/02/2017 8:21 AM

## 2017-06-02 NOTE — Patient Instructions (Addendum)
Medication Instructions:   Your physician recommends that you continue on your current medications as directed. Please refer to the Current Medication list given to you today.   If you need a refill on your cardiac medications before your next appointment, please call your pharmacy.  Labwork: NONE ORDERED  TODAY    Testing/Procedures: NONE ORDERED  TODAY    Follow-Up: Your physician wants you to follow-up in:  IN  6  MONTHS WITH DR [name]  You will receive a reminder letter in the mail two months in advance. If you don't receive a letter, please call our office to schedule the follow-up appointment.    Any Other Special Instructions Will Be Listed Below (If Applicablable)  You have been reccommended to increase your physical activity  please contact office If you  have any of the same symptoms reoccur

## 2017-06-24 ENCOUNTER — Other Ambulatory Visit: Payer: Self-pay | Admitting: Family Medicine

## 2017-06-24 DIAGNOSIS — B349 Viral infection, unspecified: Secondary | ICD-10-CM | POA: Diagnosis not present

## 2017-06-24 DIAGNOSIS — R109 Unspecified abdominal pain: Secondary | ICD-10-CM

## 2017-06-24 DIAGNOSIS — R1011 Right upper quadrant pain: Secondary | ICD-10-CM | POA: Diagnosis not present

## 2017-06-24 DIAGNOSIS — M545 Low back pain: Secondary | ICD-10-CM | POA: Diagnosis not present

## 2017-06-25 ENCOUNTER — Ambulatory Visit
Admission: RE | Admit: 2017-06-25 | Discharge: 2017-06-25 | Disposition: A | Discharge status: Home / Self Care | Payer: 59 | Source: Ambulatory Visit | Attending: Family Medicine | Admitting: Family Medicine

## 2017-06-25 DIAGNOSIS — R109 Unspecified abdominal pain: Secondary | ICD-10-CM

## 2017-06-25 DIAGNOSIS — R1011 Right upper quadrant pain: Secondary | ICD-10-CM

## 2017-09-24 DIAGNOSIS — R42 Dizziness and giddiness: Secondary | ICD-10-CM | POA: Diagnosis not present

## 2017-09-24 DIAGNOSIS — I10 Essential (primary) hypertension: Secondary | ICD-10-CM | POA: Diagnosis not present

## 2017-10-17 DIAGNOSIS — I10 Essential (primary) hypertension: Secondary | ICD-10-CM | POA: Diagnosis not present

## 2017-11-20 DIAGNOSIS — E7800 Pure hypercholesterolemia, unspecified: Secondary | ICD-10-CM | POA: Diagnosis not present

## 2017-11-20 DIAGNOSIS — I10 Essential (primary) hypertension: Secondary | ICD-10-CM | POA: Diagnosis not present

## 2017-11-20 DIAGNOSIS — E119 Type 2 diabetes mellitus without complications: Secondary | ICD-10-CM | POA: Diagnosis not present

## 2018-01-01 DIAGNOSIS — H15121 Nodular episcleritis, right eye: Secondary | ICD-10-CM | POA: Diagnosis not present

## 2018-02-11 ENCOUNTER — Other Ambulatory Visit: Payer: Self-pay | Admitting: Family Medicine

## 2018-02-11 DIAGNOSIS — M545 Low back pain, unspecified: Secondary | ICD-10-CM

## 2018-02-11 DIAGNOSIS — R109 Unspecified abdominal pain: Secondary | ICD-10-CM

## 2018-02-16 ENCOUNTER — Ambulatory Visit
Admission: RE | Admit: 2018-02-16 | Discharge: 2018-02-16 | Disposition: A | Discharge status: Home / Self Care | Payer: 59 | Source: Ambulatory Visit | Attending: Family Medicine | Admitting: Family Medicine

## 2018-02-16 DIAGNOSIS — M545 Low back pain, unspecified: Secondary | ICD-10-CM

## 2018-02-16 DIAGNOSIS — K5730 Diverticulosis of large intestine without perforation or abscess without bleeding: Secondary | ICD-10-CM | POA: Diagnosis not present

## 2018-02-16 DIAGNOSIS — R109 Unspecified abdominal pain: Secondary | ICD-10-CM

## 2018-02-18 ENCOUNTER — Other Ambulatory Visit: Payer: Self-pay | Admitting: Family Medicine

## 2018-02-18 DIAGNOSIS — R932 Abnormal findings on diagnostic imaging of liver and biliary tract: Secondary | ICD-10-CM

## 2018-03-03 ENCOUNTER — Ambulatory Visit
Admission: RE | Admit: 2018-03-03 | Discharge: 2018-03-03 | Disposition: A | Discharge status: Home / Self Care | Payer: 59 | Source: Ambulatory Visit | Attending: Family Medicine | Admitting: Family Medicine

## 2018-03-03 DIAGNOSIS — K7689 Other specified diseases of liver: Secondary | ICD-10-CM | POA: Diagnosis not present

## 2018-03-03 DIAGNOSIS — R932 Abnormal findings on diagnostic imaging of liver and biliary tract: Secondary | ICD-10-CM

## 2018-05-22 DIAGNOSIS — Z125 Encounter for screening for malignant neoplasm of prostate: Secondary | ICD-10-CM | POA: Diagnosis not present

## 2018-05-22 DIAGNOSIS — E291 Testicular hypofunction: Secondary | ICD-10-CM | POA: Diagnosis not present

## 2018-05-22 DIAGNOSIS — E7800 Pure hypercholesterolemia, unspecified: Secondary | ICD-10-CM | POA: Diagnosis not present

## 2018-05-22 DIAGNOSIS — E119 Type 2 diabetes mellitus without complications: Secondary | ICD-10-CM | POA: Diagnosis not present

## 2018-05-22 DIAGNOSIS — I10 Essential (primary) hypertension: Secondary | ICD-10-CM | POA: Diagnosis not present

## 2018-07-29 DIAGNOSIS — M791 Myalgia, unspecified site: Secondary | ICD-10-CM | POA: Diagnosis not present

## 2018-08-14 DIAGNOSIS — M5136 Other intervertebral disc degeneration, lumbar region: Secondary | ICD-10-CM | POA: Diagnosis not present

## 2018-08-14 DIAGNOSIS — M47896 Other spondylosis, lumbar region: Secondary | ICD-10-CM | POA: Diagnosis not present

## 2018-08-17 ENCOUNTER — Other Ambulatory Visit: Payer: Self-pay | Admitting: Physician Assistant

## 2018-08-17 DIAGNOSIS — R1011 Right upper quadrant pain: Secondary | ICD-10-CM

## 2018-08-17 DIAGNOSIS — M549 Dorsalgia, unspecified: Secondary | ICD-10-CM | POA: Diagnosis not present

## 2018-08-17 DIAGNOSIS — R110 Nausea: Secondary | ICD-10-CM | POA: Diagnosis not present

## 2018-08-18 ENCOUNTER — Telehealth: Payer: Self-pay | Admitting: Cardiology

## 2018-08-18 NOTE — Progress Notes (Signed)
Cardiology Office Note   Date:  08/19/2018   ID:  SHARIQ PUIG, DOB 11-22-1960, MRN [id number]  PCP:  [name], MD  Cardiologist:  Ulyses Jarred chief complaint on file.    History of Present Illness: Joshua Stanton is a 58 y.o. male who presents for ongoing assessment and management of chest pain, and palpitations. Cardiac cath revealed normal coronary arteries. He was found to have hypertensive heart disease. He has a hx of OSA and is complaint with his CPAP.   He called our office on 08/18/2018 reporting that his HR was elevated according to his watch. He requested an appointment.  He states that while at work he experienced 3 episodes of racing heart rate, lasting 2-3 minutes, and also while at home watching TV. He was not symptomatic with this, but could definitely tell that his HR was up.   Past Medical History:  Diagnosis Date  . Arthritis    "lower back" (09/28/2015)  . Atypical angina ([place]) dx'd 09/2015  . GERD (gastroesophageal reflux disease)   . High cholesterol   . History of hiatal hernia   . Hypertension   . OSA on CPAP   . Prediabetes   . Seasonal allergies     Past Surgical History:  Procedure Laterality Date  . ANKLE HARDWARE REMOVAL Left 2006   left   . CARDIAC CATHETERIZATION N/A 09/28/2015   Procedure: Left Heart Cath and Coronary Angiography;  Surgeon: Wellington Hampshire, MD;  Location: [facility];  Service: Cardiovascular;  Laterality: N/A;  . FRACTURE SURGERY    . KNEE ARTHROSCOPY  09/20/2011   Procedure: ARTHROSCOPY KNEE;  Surgeon: Gearlean Alf, MD;  Location: [facility];  Service: Orthopedics;  Laterality: Right;  Medial meniscal DEBRIDEMENT with chondroplasty, right knee  . KNEE ARTHROSCOPY Left 04/07/2013   Procedure: LEFT KNEE ARTHROSCOPY WITH MEDIAL MENISCUS  DEBRIDEMENT ;  Surgeon: Gearlean Alf, MD;  Location: [facility];  Service: Orthopedics;  Laterality: Left;  . LIPOMA EXCISION Right 2004   neck  . NASAL  SEPTUM SURGERY  1992  . ORIF ANKLE FRACTURE Left 1981  . TONSILLECTOMY  2008  . UVULOPALATOPHARYNGOPLASTY  2008     Current Outpatient Medications  Medication Sig Dispense Refill  . amLODipine (NORVASC) 2.5 MG tablet Take 2.5 mg daily by mouth.  6  . aspirin EC 81 MG EC tablet Take 1 tablet (81 mg total) by mouth daily.    Marland Kitchen atorvastatin (LIPITOR) 20 MG tablet Take 1 tablet (20 mg total) by mouth daily at 6 PM. 30 tablet 0  . LORazepam (ATIVAN) 0.5 MG tablet Take 0.5 mg by mouth as needed.    Marland Kitchen losartan (COZAAR) 100 MG tablet Take 100 mg daily by mouth.    . pantoprazole (PROTONIX) 40 MG tablet Take 1 tablet (40 mg total) by mouth daily. 30 tablet 0  . TESTIM 50 MG/5GM (1%) GEL Apply 5 g topically every other day.   5  . zolpidem (AMBIEN) 10 MG tablet Take 10 mg by mouth at bedtime as needed for sleep.   3   No current facility-administered medications for this visit.     Allergies:   Oxycodone-acetaminophen    Social History:  The patient  reports that he has never smoked. He quit smokeless tobacco use about 16 years ago.  His smokeless tobacco use included chew. He reports current alcohol use. He reports current drug use. Drug: Marijuana.   Family History:  The patient's  family history includes Dementia in his mother; Hypertension in his mother.    ROS: All other systems are reviewed and negative. Unless otherwise mentioned in H&P    PHYSICAL EXAM: VS:  BP 140/82   Pulse 72   Ht 5\' 7"  (1.702 m)   Wt 260 lb 6.4 oz (118.1 kg)   BMI 40.78 kg/m  , BMI Body mass index is 40.78 kg/m. GEN: Well nourished, well developed, in no acute distress, obese  HEENT: normal Neck: no JVD, carotid bruits, or masses Cardiac: RRR; no murmurs, rubs, or gallops,no edema  Respiratory:  Clear to auscultation bilaterally, normal work of breathing GI: soft, nontender, nondistended, + BS MS: no deformity or atrophy Skin: warm and dry, no rash Neuro:  Strength and sensation are intact Psych:  euthymic mood, full affect   EKG:  NSR with sinus arrhythmia. LVH is noted with repolarization abnormalities.   Recent Labs: No results found for requested labs within last 8760 hours.    Lipid Panel    Component Value Date/Time   CHOL 184 09/21/2015 0915   TRIG 106 09/21/2015 0915   HDL 38 (L) 09/21/2015 0915   CHOLHDL 4.8 09/21/2015 0915   VLDL 21 09/21/2015 0915   LDLCALC 125 (H) 09/21/2015 0915      Wt Readings from Last 3 Encounters:  08/19/18 260 lb 6.4 oz (118.1 kg)  06/02/17 267 lb 1.9 oz (121.2 kg)  05/14/17 265 lb (120.2 kg)      Other studies Reviewed: Echocardiogram 10/06/2015  Left ventricle: The cavity size was normal. There was moderate   concentric hypertrophy. Systolic function was normal. The   estimated ejection fraction was in the range of 60% to 65%. Wall   motion was normal; there were no regional wall motion   abnormalities. Doppler parameters are consistent with abnormal   left ventricular relaxation (grade 1 diastolic dysfunction).   There was no evidence of elevated ventricular filling pressure by   Doppler parameters. - Aortic valve: Trileaflet; normal thickness leaflets. There was no   regurgitation. - Aortic root: The aortic root was normal in size. - Left atrium: The atrium was moderately dilated. - Right ventricle: The cavity size was mildly dilated. Wall   thickness was normal. - Right atrium: The atrium was mildly dilated. - Tricuspid valve: There was mild regurgitation. - Pulmonic valve: There was no regurgitation. - Pulmonary arteries: Systolic pressure was within the normal   range. - Inferior vena cava: The vessel was normal in size.  ASSESSMENT AND PLAN:  1. Tachy palpitations: I will place a Zio monitor on for 2 weeks to ascertain frequency and morphology of his rapid HR. He is to avoid caffeine and energy drinks.   2. Hypertension: BP is very well controlled currently. No changes at this time. He will need to have repeat  echo on next visit after Zio report.   3. OSA: Continue compliance with his CPAP.    Current medicines are reviewed at length with the patient today.    Labs/ tests ordered today include: Zio cardiac monitor for 2 weeks.   Phill Myron. West Pugh, ANP, AACC   08/19/2018 9:58 AM    [facility] [place] [address] Office [phone] Fax [phone]

## 2018-08-18 NOTE — Telephone Encounter (Signed)
Spoke with patient and he stated yesterday according to his watch his resting heart rate was elevated. This has been going on for a while. Scheduled appointment with Arnold Long DNP tomorrow morning. Advised patient of time and location.

## 2018-08-18 NOTE — Telephone Encounter (Signed)
New Message   Patient c/o Palpitations:  High priority if patient c/o lightheadedness, shortness of breath, or chest pain  1) How long have you had palpitations/irregular HR/ Afib? Are you having the symptoms now? Started on and off since fall   2) Are you currently experiencing lightheadedness, SOB or CP? No, gets anxiety or nervousness   3) Do you have a history of afib (atrial fibrillation) or irregular heart rhythm? No, but pt says years ago they told him he had a heart murmer   4) Have you checked your BP or HR? (document readings if available): BP was normal for two of the episodes that happened yesterday and HR was 150 and another time is was HR 170 normally around 65-75  5) Are you experiencing any other symptoms? Nervousness, anxiety, pt says maybe a little SOB when he stands up during an episode.   I did schedule an appt for Feb 24th at 8:40

## 2018-08-19 ENCOUNTER — Encounter: Payer: Self-pay | Admitting: Adult Health

## 2018-08-19 ENCOUNTER — Ambulatory Visit (INDEPENDENT_AMBULATORY_CARE_PROVIDER_SITE_OTHER): Payer: 59

## 2018-08-19 ENCOUNTER — Ambulatory Visit: Payer: 59 | Admitting: Adult Health

## 2018-08-19 VITALS — BP 140/82 | HR 72 | Ht 67.0 in | Wt 260.4 lb

## 2018-08-19 DIAGNOSIS — R000 Tachycardia, unspecified: Secondary | ICD-10-CM | POA: Diagnosis not present

## 2018-08-19 DIAGNOSIS — I10 Essential (primary) hypertension: Secondary | ICD-10-CM

## 2018-08-19 NOTE — Patient Instructions (Signed)
Medication Instructions:  NO CHANGES- Your physician recommends that you continue on your current medications as directed. Please refer to the Current Medication list given to you today. If you need a refill on your cardiac medications before your next appointment, please call your pharmacy.  Labwork: NONE  When you have your labs (blood work) drawn today and your tests are completely normal, you will receive your results only by MyChart Message (if you have MyChart) -OR-  A paper copy in the mail.  If you have any lab test that is abnormal or we need to change your treatment, we will call you to review these results.  Testing/Procedures: Your physician has recommended that you wear a 2 WEEK DAY ZIO-PATCH monitor. The Zio patch cardiac monitor continuously records heart rhythm data for up to 14 days, this is for patients being evaluated for multiple types heart rhythms. For the first 24 hours post application, please avoid getting the Zio monitor wet in the shower or by excessive sweating during exercise. After that, feel free to carry on with regular activities. Keep soaps and lotions away from the ZIO XT Patch.  This will be placed at our [facility] location - [address].      Follow-Up: You will need a follow up appointment in 1 months.  You may see Glenetta Hew, MD Jory Sims, DNP, AACC or one of the following Advanced Practice Providers on your designated Care Team:  Jory Sims, DNP, AACC  Rosaria Ferries, PA-C  At [facility], you and your health needs are our priority.  As part of our continuing mission to provide you with exceptional heart care, we have created designated Provider Care Teams.  These Care Teams include your primary Cardiologist (physician) and Advanced Practice Providers (APPs -  Physician Assistants and Nurse Practitioners) who all work together to provide you with the care you need, when you need it.  Thank you for choosing [facility] at  [facility]!!

## 2018-08-21 ENCOUNTER — Ambulatory Visit (HOSPITAL_COMMUNITY): Payer: 59

## 2018-08-31 ENCOUNTER — Ambulatory Visit (HOSPITAL_COMMUNITY)
Admission: RE | Admit: 2018-08-31 | Discharge: 2018-08-31 | Disposition: A | Discharge status: Home / Self Care | Payer: 59 | Source: Ambulatory Visit | Attending: Physician Assistant | Admitting: Physician Assistant

## 2018-08-31 DIAGNOSIS — R110 Nausea: Secondary | ICD-10-CM | POA: Insufficient documentation

## 2018-08-31 DIAGNOSIS — R1011 Right upper quadrant pain: Secondary | ICD-10-CM | POA: Insufficient documentation

## 2018-08-31 MED ORDER — TECHNETIUM TC 99M MEBROFENIN IV KIT
5.1700 | PACK | Freq: Once | INTRAVENOUS | Status: AC | PRN
  Administered 2018-08-31: 5.17 via INTRAVENOUS

## 2018-09-04 ENCOUNTER — Telehealth: Payer: Self-pay | Admitting: *Deleted

## 2018-09-04 NOTE — Telephone Encounter (Signed)
RNr reiview schedule for 09/07/18 - patient recently saw extender Purcell Nails 08/19/18 . zio ppatch placed  completed 08/1918 result are not available . Appointment cancelled 09/07/18. Patient aware to keep appointment for 09/17/2018 with Purcell Nails DNP. result will be discuss and follow up appointment with dr [name].

## 2018-09-07 ENCOUNTER — Ambulatory Visit: Payer: 59 | Admitting: Cardiology

## 2018-09-13 HISTORY — PX: OTHER SURGICAL HISTORY: SHX169

## 2018-09-14 ENCOUNTER — Other Ambulatory Visit: Payer: Self-pay | Admitting: Gastroenterology

## 2018-09-14 DIAGNOSIS — R110 Nausea: Secondary | ICD-10-CM | POA: Diagnosis not present

## 2018-09-14 DIAGNOSIS — M545 Low back pain, unspecified: Secondary | ICD-10-CM

## 2018-09-14 DIAGNOSIS — R194 Change in bowel habit: Secondary | ICD-10-CM

## 2018-09-18 ENCOUNTER — Other Ambulatory Visit: Payer: 59

## 2018-09-21 NOTE — Progress Notes (Deleted)
Cardiology Office Note   Date:  09/21/2018   ID:  Joshua Stanton, DOB [birth year], MRN [id number]  PCP:  [name], MD  Cardiologist: Ulyses Jarred chief complaint on file.    History of Present Illness: Joshua Stanton is a 58 y.o. male who presents for ongoing assessment and management of chest pain, and palpitations. Cardiac cath revealed normal coronary arteries. He was found to have hypertensive heart disease. He has a hx of OSA and is complaint with his CPAP.   On last office visit, he reported elevated HR per his Apple Watch. A Zio heart monitor was placed to evaluate further. This revealed abnormal results with "several types of short bursts of arrhythmias, of one very short either abberancy or VT run with several PAT/PSVT runs, that appear to be either atrial fib or flutter, vs MAT." per Dr. [name]. EP was to review.    Past Medical History:  Diagnosis Date  . Arthritis    "lower back" (09/28/2015)  . Atypical angina ([place]) dx'd 09/2015  . GERD (gastroesophageal reflux disease)   . High cholesterol   . History of hiatal hernia   . Hypertension   . OSA on CPAP   . Prediabetes   . Seasonal allergies     Past Surgical History:  Procedure Laterality Date  . ANKLE HARDWARE REMOVAL Left 2006   left   . CARDIAC CATHETERIZATION N/A 09/28/2015   Procedure: Left Heart Cath and Coronary Angiography;  Surgeon: Wellington Hampshire, MD;  Location: [place] [facility];  Service: Cardiovascular;  Laterality: N/A;  . FRACTURE SURGERY    . KNEE ARTHROSCOPY  09/20/2011   Procedure: ARTHROSCOPY KNEE;  Surgeon: Gearlean Alf, MD;  [facility]: [facility];  Service: Orthopedics;  Laterality: Right;  Medial meniscal DEBRIDEMENT with chondroplasty, right knee  . KNEE ARTHROSCOPY Left 04/07/2013   Procedure: LEFT KNEE ARTHROSCOPY WITH MEDIAL MENISCUS  DEBRIDEMENT ;  Surgeon: Gearlean Alf, MD;  Location: [facility];  Service: Orthopedics;  Laterality: Left;  . LIPOMA EXCISION  Right 2004   neck  . NASAL SEPTUM SURGERY  1992  . ORIF ANKLE FRACTURE Left 1981  . TONSILLECTOMY  2008  . UVULOPALATOPHARYNGOPLASTY  2008     Current Outpatient Medications  Medication Sig Dispense Refill  . amLODipine (NORVASC) 2.5 MG tablet Take 2.5 mg daily by mouth.  6  . aspirin EC 81 MG EC tablet Take 1 tablet (81 mg total) by mouth daily.    Marland Kitchen atorvastatin (LIPITOR) 20 MG tablet Take 1 tablet (20 mg total) by mouth daily at 6 PM. 30 tablet 0  . LORazepam (ATIVAN) 0.5 MG tablet Take 0.5 mg by mouth as needed.    Marland Kitchen losartan (COZAAR) 100 MG tablet Take 100 mg daily by mouth.    . pantoprazole (PROTONIX) 40 MG tablet Take 1 tablet (40 mg total) by mouth daily. 30 tablet 0  . TESTIM 50 MG/5GM (1%) GEL Apply 5 g topically every other day.   5  . zolpidem (AMBIEN) 10 MG tablet Take 10 mg by mouth at bedtime as needed for sleep.   3   No current facility-administered medications for this visit.     Allergies:   Oxycodone-acetaminophen    Social History:  The patient  reports that he has never smoked. He quit smokeless tobacco use about 17 years ago.  His smokeless tobacco use included chew. He reports current alcohol use. He reports current drug use. Drug: Marijuana.   Family  History:  The patient's family history includes Dementia in his mother; Hypertension in his mother.    ROS: All other systems are reviewed and negative. Unless otherwise mentioned in H&P    PHYSICAL EXAM: VS:  There were no vitals taken for this visit. , BMI There is no height or weight on file to calculate BMI. GEN: Well nourished, well developed, in no acute distress HEENT: normal Neck: no JVD, carotid bruits, or masses Cardiac: ***RRR; no murmurs, rubs, or gallops,no edema  Respiratory:  Clear to auscultation bilaterally, normal work of breathing GI: soft, nontender, nondistended, + BS MS: no deformity or atrophy Skin: warm and dry, no rash Neuro:  Strength and sensation are intact Psych: euthymic  mood, full affect   EKG:  EKG {ACTION; IS/IS [id number]} ordered today. The ekg ordered today demonstrates ***   Recent Labs: No results found for requested labs within last 8760 hours.    Lipid Panel    Component Value Date/Time   CHOL 184 09/21/2015 0915   TRIG 106 09/21/2015 0915   HDL 38 (L) 09/21/2015 0915   CHOLHDL 4.8 09/21/2015 0915   VLDL 21 09/21/2015 0915   LDLCALC 125 (H) 09/21/2015 0915      Wt Readings from Last 3 Encounters:  08/19/18 260 lb 6.4 oz (118.1 kg)  06/02/17 267 lb 1.9 oz (121.2 kg)  05/14/17 265 lb (120.2 kg)      Other studies Reviewed: Echocardiogram 10-15-2015  Left ventricle: The cavity size was normal. There was moderate concentric hypertrophy. Systolic function was normal. The estimated ejection fraction was in the range of 60% to 65%. Wall motion was normal; there were no regional wall motion abnormalities. Doppler parameters are consistent with abnormal left ventricular relaxation (grade 1 diastolic dysfunction). There was no evidence of elevated ventricular filling pressure by Doppler parameters. - Aortic valve: Trileaflet; normal thickness leaflets. There was no regurgitation. - Aortic root: The aortic root was normal in size. - Left atrium: The atrium was moderately dilated. - Right ventricle: The cavity size was mildly dilated. Wall thickness was normal. - Right atrium: The atrium was mildly dilated. - Tricuspid valve: There was mild regurgitation. - Pulmonic valve: There was no regurgitation. - Pulmonary arteries: Systolic pressure was within the normal range. - Inferior vena cava: The vessel was normal in size.  ASSESSMENT AND PLAN:  1.  ***   Current medicines are reviewed at length with the patient today.    Labs/ tests ordered today include: *** Phill Myron. West Pugh, ANP, AACC   09/21/2018 7:23 AM    [place] [address] Office [phone] Fax  [phone]

## 2018-09-23 ENCOUNTER — Other Ambulatory Visit: Payer: Self-pay

## 2018-09-23 ENCOUNTER — Encounter: Payer: Self-pay | Admitting: Adult Health

## 2018-09-23 ENCOUNTER — Ambulatory Visit: Payer: 59 | Admitting: Adult Health

## 2018-09-23 VITALS — BP 158/88 | HR 67 | Ht 67.0 in | Wt 260.0 lb

## 2018-09-23 DIAGNOSIS — I471 Supraventricular tachycardia: Secondary | ICD-10-CM

## 2018-09-23 DIAGNOSIS — I4719 Other supraventricular tachycardia: Secondary | ICD-10-CM

## 2018-09-23 DIAGNOSIS — I10 Essential (primary) hypertension: Secondary | ICD-10-CM

## 2018-09-23 DIAGNOSIS — R002 Palpitations: Secondary | ICD-10-CM

## 2018-09-23 MED ORDER — METOPROLOL TARTRATE 25 MG PO TABS: 12.5000 mg | ORAL_TABLET | Freq: Every day | ORAL | 3 refills | Status: DC

## 2018-09-23 NOTE — Progress Notes (Signed)
Cardiology Office Note   Date:  09/21/2018   ID:  Joshua Stanton, DOB May 26, 1961, MRN [id number]  PCP:  [name], MD  Cardiologist: Ulyses Jarred chief complaint on file.    History of Present Illness: Joshua Stanton is a 58 y.o. male who presents for ongoing assessment and management of chest pain, and palpitations. Cardiac cath revealed normal coronary arteries. He was found to have hypertensive heart disease. He has a hx of OSA and is complaint with his CPAP.   On last office visit, he reported elevated HR per his Apple Watch. A Zio heart monitor was placed to evaluate further. This revealed abnormal results with "several types of short bursts of arrhythmias, of one very short either abberancy or VT run with several PAT/PSVT runs, that appear to be either atrial fib or flutter, vs MAT." per Dr. [name]. EP was to review.    He states that he still has some episodes lasting 5-10 minutes but feels longer to him. He sometimes takes an Ativan and does some deep breathing which helps.  He is anxious at baseline.   Past Medical History:  Diagnosis Date  . Arthritis    "lower back" (09/28/2015)  . Atypical angina ([place]) dx'd 09/2015  . GERD (gastroesophageal reflux disease)   . High cholesterol   . History of hiatal hernia   . Hypertension   . OSA on CPAP   . Prediabetes   . Seasonal allergies     Past Surgical History:  Procedure Laterality Date  . ANKLE HARDWARE REMOVAL Left 2006   left   . CARDIAC CATHETERIZATION N/A 09/28/2015   Procedure: Left Heart Cath and Coronary Angiography;  Surgeon: Wellington Hampshire, MD;  Location: [place] [facility];  Service: Cardiovascular;  Laterality: N/A;  . FRACTURE SURGERY    . KNEE ARTHROSCOPY  09/20/2011   Procedure: ARTHROSCOPY KNEE;  Surgeon: Gearlean Alf, MD;  Location: [facility];  Service: Orthopedics;  Laterality: Right;  Medial meniscal DEBRIDEMENT with chondroplasty, right knee  . KNEE ARTHROSCOPY Left  04/07/2013   Procedure: LEFT KNEE ARTHROSCOPY WITH MEDIAL MENISCUS  DEBRIDEMENT ;  Surgeon: Gearlean Alf, MD;  Location: [facility];  Service: Orthopedics;  Laterality: Left;  . LIPOMA EXCISION Right 2004   neck  . NASAL SEPTUM SURGERY  1992  . ORIF ANKLE FRACTURE Left 1981  . TONSILLECTOMY  2008  . UVULOPALATOPHARYNGOPLASTY  2008     Current Outpatient Medications  Medication Sig Dispense Refill  . amLODipine (NORVASC) 2.5 MG tablet Take 2.5 mg daily by mouth.  6  . aspirin EC 81 MG EC tablet Take 1 tablet (81 mg total) by mouth daily.    Marland Kitchen atorvastatin (LIPITOR) 20 MG tablet Take 1 tablet (20 mg total) by mouth daily at 6 PM. 30 tablet 0  . LORazepam (ATIVAN) 0.5 MG tablet Take 0.5 mg by mouth as needed.    Marland Kitchen losartan (COZAAR) 100 MG tablet Take 100 mg daily by mouth.    . pantoprazole (PROTONIX) 40 MG tablet Take 1 tablet (40 mg total) by mouth daily. 30 tablet 0  . TESTIM 50 MG/5GM (1%) GEL Apply 5 g topically every other day.   5  . zolpidem (AMBIEN) 10 MG tablet Take 10 mg by mouth at bedtime as needed for sleep.   3   No current facility-administered medications for this visit.     Allergies:   Oxycodone-acetaminophen    Social History:  The patient  reports  that he has never smoked. He quit smokeless tobacco use about 17 years ago.  His smokeless tobacco use included chew. He reports current alcohol use. He reports current drug use. Drug: Marijuana.   Family History:  The patient's family history includes Dementia in his mother; Hypertension in his mother.    ROS: All other systems are reviewed and negative. Unless otherwise mentioned in H&P    PHYSICAL EXAM: VS:  There were no vitals taken for this visit. , BMI There is no height or weight on file to calculate BMI. GEN: Well nourished, well developed, in no acute distress HEENT: normal Neck: no JVD, carotid bruits, or masses Cardiac: RRR; no murmurs, rubs, or gallops,no edema  Respiratory:  Clear to auscultation  bilaterally, normal work of breathing GI: soft, nontender, nondistended, + BS MS: no deformity or atrophy Skin: warm and dry, no rash Neuro:  Strength and sensation are intact Psych: euthymic mood, full affect   EKG:  NSR with LVH rate of 67 bpm.   Recent Labs: No results found for requested labs within last 8760 hours.    Lipid Panel    Component Value Date/Time   CHOL 184 09/21/2015 0915   TRIG 106 09/21/2015 0915   HDL 38 (L) 09/21/2015 0915   CHOLHDL 4.8 09/21/2015 0915   VLDL 21 09/21/2015 0915   LDLCALC 125 (H) 09/21/2015 0915      Wt Readings from Last 3 Encounters:  08/19/18 260 lb 6.4 oz (118.1 kg)  06/02/17 267 lb 1.9 oz (121.2 kg)  05/14/17 265 lb (120.2 kg)      Other studies Reviewed: Echocardiogram 10/26/2015  Left ventricle: The cavity size was normal. There was moderate concentric hypertrophy. Systolic function was normal. The estimated ejection fraction was in the range of 60% to 65%. Wall motion was normal; there were no regional wall motion abnormalities. Doppler parameters are consistent with abnormal left ventricular relaxation (grade 1 diastolic dysfunction). There was no evidence of elevated ventricular filling pressure by Doppler parameters. - Aortic valve: Trileaflet; normal thickness leaflets. There was no regurgitation. - Aortic root: The aortic root was normal in size. - Left atrium: The atrium was moderately dilated. - Right ventricle: The cavity size was mildly dilated. Wall thickness was normal. - Right atrium: The atrium was mildly dilated. - Tricuspid valve: There was mild regurgitation. - Pulmonic valve: There was no regurgitation. - Pulmonary arteries: Systolic pressure was within the normal range. - Inferior vena cava: The vessel was normal in size.  ASSESSMENT AND PLAN:  1. Atrial tachycardia: Found on cardiac monitor, I will stop the amlodipine and begin metoprolol 12.5 mg daily to assist with this and  refer to EP for further testing and recommendations. I will check a TSH today.   2. Hypertension: BP elevated today. He was late coming Normally BP in the 811'B to 147'W systolic. Continue losartan.   3. OSA: Compliant with CPAP.    Current medicines are reviewed at length with the patient today.  I have discussed referral to EP with Dr. [name] who in agreement with this plan and addition of metoprolol.   Labs/ tests ordered today include: TSH  Phill Myron. West Pugh, ANP, AACC   09/21/2018 7:23 AM    [place] [address] Office [phone] Fax [phone]

## 2018-09-23 NOTE — Patient Instructions (Signed)
Medication Instructions:  STOP AMLODIPINE START METOPROLOL TARTRATE 12.5MG  (1/2 TAB) DAILY-MAY TAKE ADDITIONAL 1/2 TAB FOR PALPITATIONS If you need a refill on your cardiac medications before your next appointment, please call your pharmacy.  Follow-Up: REFER TO EP FOR ABNORMAL MONITOR AND PAT AND MAT  At [facility], you and your health needs are our priority.  As part of our continuing mission to provide you with exceptional heart care, we have created designated Provider Care Teams.  These Care Teams include your primary Cardiologist (physician) and Advanced Practice Providers (APPs -  Physician Assistants and Nurse Practitioners) who all work together to provide you with the care you need, when you need it.  Thank you for choosing [facility] at [facility]!!

## 2018-09-25 ENCOUNTER — Ambulatory Visit
Admission: RE | Admit: 2018-09-25 | Discharge: 2018-09-25 | Disposition: A | Discharge status: Home / Self Care | Payer: 59 | Source: Ambulatory Visit | Attending: Gastroenterology | Admitting: Gastroenterology

## 2018-09-25 ENCOUNTER — Other Ambulatory Visit: Payer: 59

## 2018-09-25 ENCOUNTER — Other Ambulatory Visit: Payer: Self-pay

## 2018-09-25 DIAGNOSIS — M545 Low back pain, unspecified: Secondary | ICD-10-CM

## 2018-09-25 DIAGNOSIS — R110 Nausea: Secondary | ICD-10-CM

## 2018-09-25 DIAGNOSIS — R194 Change in bowel habit: Secondary | ICD-10-CM

## 2018-09-25 DIAGNOSIS — K4020 Bilateral inguinal hernia, without obstruction or gangrene, not specified as recurrent: Secondary | ICD-10-CM | POA: Diagnosis not present

## 2018-09-25 MED ORDER — IOPAMIDOL (ISOVUE-300) INJECTION 61%
100.0000 mL | Freq: Once | INTRAVENOUS | Status: AC | PRN
  Administered 2018-09-25: 100 mL via INTRAVENOUS

## 2018-09-28 ENCOUNTER — Encounter: Payer: Self-pay | Admitting: Internal Medicine

## 2018-09-29 ENCOUNTER — Telehealth: Payer: Self-pay

## 2018-09-29 NOTE — Telephone Encounter (Signed)
Call placed to Pt.  Advised at this time-routine follow up appointments are being cancelled to reduce possible exposure for our patients.  Per Pt-feels better since starting the metoprolol tartrate 12.5 mg daily  Pt would be interested in telehealth visit option.

## 2018-09-30 ENCOUNTER — Telehealth (INDEPENDENT_AMBULATORY_CARE_PROVIDER_SITE_OTHER): Payer: 59 | Admitting: Internal Medicine

## 2018-09-30 ENCOUNTER — Other Ambulatory Visit: Payer: Self-pay

## 2018-09-30 ENCOUNTER — Institutional Professional Consult (permissible substitution): Payer: 59 | Admitting: Internal Medicine

## 2018-09-30 DIAGNOSIS — I471 Supraventricular tachycardia: Secondary | ICD-10-CM | POA: Diagnosis not present

## 2018-09-30 DIAGNOSIS — R002 Palpitations: Secondary | ICD-10-CM

## 2018-09-30 NOTE — Progress Notes (Signed)
Electrophysiology TeleHealth Note   Due to national recommendations of social distancing due to [place] 19, Audio/video telehealth visit is felt to be most appropriate for this patient at this time.  See MyChart message from today for patient consent regarding telehealth for [name].   Date:  09/30/2018   ID:  Joshua Stanton, DOB 11-Mar-1961, MRN [id number]  Location: home  Provider location: [address], [place] Evaluation Performed: New patient consult  PCP:  [name], MD  Cardiologist:  Dr [name] Primary Electrophysiologist: Thompson Grayer, MD    CC: palpitations   History of Present Illness: Joshua Stanton is a 58 y.o. male who presents via audio/video conferencing for a telehealth visit today.   The patient is referred for new consultation regarding arrhythmias by Dr [name] and Jory Sims.  He reports having symptoms of palpitations for which he was quite concerned.  He noted on his fitbit that his heart rates were 170s.  He was evaluated by Dr [name] and had event monitor placed.  This documented NSVT, PACs, nonsustained atach, and event atrial flutter with RVR.  He was started on metoprolol.  He reports doing better with this.  His palpitations have improved.  He has only very rare palpitations since that time.  He is tolerating metoprolol well, without side effects.  Today, he denies symptoms of palpitations, chest pain, shortness of breath, orthopnea, PND, lower extremity edema, claudication, dizziness, presyncope, syncope, bleeding, or neurologic sequela. The patient is tolerating medications without difficulties and is otherwise without complaint today.   he denies symptoms of cough, fevers, chills, or new SOB worrisome for COVID 19.     Past Medical History:  Diagnosis Date  . Arthritis    "lower back" (09/28/2015)  . Atypical angina ([place]) dx'd 09/2015  . GERD (gastroesophageal reflux disease)   . High cholesterol   . History  of hiatal hernia   . Hypertension   . OSA on CPAP   . Prediabetes   . Seasonal allergies    Past Surgical History:  Procedure Laterality Date  . ANKLE HARDWARE REMOVAL Left 2006   left   . CARDIAC CATHETERIZATION N/A 09/28/2015   Procedure: Left Heart Cath and Coronary Angiography;  Surgeon: Wellington Hampshire, MD;  Location: [place] [facility];  Service: Cardiovascular;  Laterality: N/A;  . FRACTURE SURGERY    . KNEE ARTHROSCOPY  09/20/2011   Procedure: ARTHROSCOPY KNEE;  Surgeon: Gearlean Alf, MD;  [facility];  Service: Orthopedics;  Laterality: Right;  Medial meniscal DEBRIDEMENT with chondroplasty, right knee  . KNEE ARTHROSCOPY Left 04/07/2013   Procedure: LEFT KNEE ARTHROSCOPY WITH MEDIAL MENISCUS  DEBRIDEMENT ;  Surgeon: Gearlean Alf, MD;  Location[facility];  Service: Orthopedics;  Laterality: Left;  . LIPOMA EXCISION Right 2004   neck  . NASAL SEPTUM SURGERY  1992  . ORIF ANKLE FRACTURE Left 1981  . TONSILLECTOMY  2008  . UVULOPALATOPHARYNGOPLASTY  2008     Current Outpatient Medications  Medication Sig Dispense Refill  . aspirin EC 81 MG EC tablet Take 1 tablet (81 mg total) by mouth daily.    Marland Kitchen atorvastatin (LIPITOR) 20 MG tablet Take 1 tablet (20 mg total) by mouth daily at 6 PM. 30 tablet 0  . LORazepam (ATIVAN) 0.5 MG tablet Take 0.5 mg by mouth as needed.    Marland Kitchen losartan (COZAAR) 100 MG tablet Take 100 mg daily by mouth.    . metoprolol tartrate (  LOPRESSOR) 25 MG tablet Take 0.5 tablets (12.5 mg total) by mouth daily. May take additional 1/2 tab for palpitations 40 tablet 3  . pantoprazole (PROTONIX) 40 MG tablet Take 1 tablet (40 mg total) by mouth daily. 30 tablet 0  . TESTIM 50 MG/5GM (1%) GEL Apply 5 g topically every other day.   5  . zolpidem (AMBIEN) 10 MG tablet Take 10 mg by mouth at bedtime as needed for sleep.   3   No current facility-administered medications for this visit.     Allergies:   Oxycodone-acetaminophen   Social  History:  The patient  reports that he has never smoked. He quit smokeless tobacco use about 17 years ago.  His smokeless tobacco use included chew. He reports current alcohol use. He reports current drug use. Drug: Marijuana.   Family History:  The patient's  family history includes Dementia in his mother; Hypertension in his mother.    ROS:  Please see the history of present illness.   All other systems are personally reviewed and negative.    Recent Labs: No results found for requested labs within last 8760 hours.  personally reviewed   Wt Readings from Last 3 Encounters:  09/23/18 260 lb (117.9 kg)  08/19/18 260 lb 6.4 oz (118.1 kg)  06/02/17 267 lb 1.9 oz (121.2 kg)      Other studies personally reviewed: Additional studies/ records that were reviewed today include: Curt Bears Lawrence's notes, recent event monitor  Echo 10/04/2015 reveals EF 60%, moderate LA enlargement Review of the above records today demonstrates: as above Prior radiographs: 09/22/2015 revealed EF 44%, low risk Cath 2017 showed normal cors  The patient presents wearable device technology report for my review today. On my review, the patient presents with an [place] watch.  He reports heart rates are followed through his device at home and have improved with metoprolol.  ASSESSMENT AND PLAN:  1.  PACs, nonsustained atach, atrial flutter Very short episodes are noted.  I have reviewed his Zio monitor fully.  His symptoms are improved with metoprolol He can take additional metoprolol if needed I would not advise AAD therapy currently. Could consider flecainide if needed down the road. Would avoid anticoagulation anticoagulation unless arrhythmia burden increases We discussed lifestyle modification as ways to keep arrhythmias from worsening. We discussed regular exercise, weight loss, and stress avoidance.  2. OA Compliant with CPAP  3. COVID screen The patient does not have any symptoms that suggest any  further testing/ screening at this time.  Social distancing reinforced today.    Follow-up with Dr [name] in 2 months  Current medicines are reviewed at length with the patient today.   The patient does not have concerns regarding his medicines.  The following changes were made today:  none  Labs/ tests ordered today include:  No orders of the defined types were placed in this encounter.   Patient Risk:  after full review of this patients clinical status, I feel that they are at moderate risk at this time.   Today, I have spent 24 minutes with the patient with telehealth technology discussing palpitations .    Signed, Thompson Grayer MD, [place] 09/30/2018 11:56 AM   [facility] [address] [place] [address] [phone] (office) [phone] (fax)

## 2018-10-26 DIAGNOSIS — R1013 Epigastric pain: Secondary | ICD-10-CM | POA: Diagnosis not present

## 2018-10-26 DIAGNOSIS — R110 Nausea: Secondary | ICD-10-CM | POA: Diagnosis not present

## 2018-10-26 DIAGNOSIS — K219 Gastro-esophageal reflux disease without esophagitis: Secondary | ICD-10-CM | POA: Diagnosis not present

## 2018-11-20 DIAGNOSIS — E119 Type 2 diabetes mellitus without complications: Secondary | ICD-10-CM | POA: Diagnosis not present

## 2018-11-20 DIAGNOSIS — I10 Essential (primary) hypertension: Secondary | ICD-10-CM | POA: Diagnosis not present

## 2018-11-20 DIAGNOSIS — K219 Gastro-esophageal reflux disease without esophagitis: Secondary | ICD-10-CM | POA: Diagnosis not present

## 2018-11-24 ENCOUNTER — Telehealth: Payer: Self-pay | Admitting: *Deleted

## 2018-11-24 NOTE — Telephone Encounter (Signed)
   TELEPHONE CALL NOTE  This patient has been deemed a candidate for follow-up tele-health visit to limit community exposure during the Covid-19 pandemic. I spoke with the patient via phone to discuss instructions.  The patient will receive a phone call 2-3 days prior to their E-Visit at which time consent will be verbally confirmed.   A Virtual Office Visit appointment type has been scheduled for 5/18 with [facility], with "VIDEO" use email   Raiford Simmonds, RN 11/24/2018 4:51 PM

## 2018-11-30 ENCOUNTER — Other Ambulatory Visit (HOSPITAL_COMMUNITY): Payer: Self-pay | Admitting: Gastroenterology

## 2018-11-30 ENCOUNTER — Other Ambulatory Visit: Payer: Self-pay | Admitting: Gastroenterology

## 2018-11-30 ENCOUNTER — Telehealth (INDEPENDENT_AMBULATORY_CARE_PROVIDER_SITE_OTHER): Payer: 59 | Admitting: Cardiology

## 2018-11-30 ENCOUNTER — Encounter: Payer: Self-pay | Admitting: Cardiology

## 2018-11-30 VITALS — Wt 245.0 lb

## 2018-11-30 DIAGNOSIS — I471 Supraventricular tachycardia: Secondary | ICD-10-CM | POA: Insufficient documentation

## 2018-11-30 DIAGNOSIS — R9431 Abnormal electrocardiogram [ECG] [EKG]: Secondary | ICD-10-CM

## 2018-11-30 DIAGNOSIS — I10 Essential (primary) hypertension: Secondary | ICD-10-CM | POA: Diagnosis not present

## 2018-11-30 DIAGNOSIS — R110 Nausea: Secondary | ICD-10-CM

## 2018-11-30 DIAGNOSIS — R1013 Epigastric pain: Secondary | ICD-10-CM

## 2018-11-30 DIAGNOSIS — I4719 Other supraventricular tachycardia: Secondary | ICD-10-CM | POA: Insufficient documentation

## 2018-11-30 MED ORDER — METOPROLOL SUCCINATE ER 25 MG PO TB24: ORAL_TABLET | ORAL | 6 refills | Status: DC

## 2018-11-30 NOTE — Progress Notes (Signed)
Virtual Visit via Video Note   This visit type was conducted due to national recommendations for restrictions regarding the COVID-19 Pandemic (e.g. social distancing) in an effort to limit this patient's exposure and mitigate transmission in our community.  Due to his co-morbid illnesses, this patient is at least at moderate risk for complications without adequate follow up.  This format is felt to be most appropriate for this patient at this time.  All issues noted in this document were discussed and addressed.  A limited physical exam was performed with this format.  Please refer to the patient's chart for his consent to telehealth for [facility].   Patient has given verbal permission to conduct this visit via virtual appointment and to bill insurance 12/01/2018 11:40 AM     Evaluation Performed:  Follow-up visit  Date:  12/01/2018   ID:  Joshua Stanton, DOB Sep 05, 1960, MRN [id number]  Patient Location: Home Provider Location: Home  PCP:  Joshua Frees, MD  Cardiologist:  Joshua Hew, MD  Electrophysiologist:  None   Chief Complaint: 44-month follow-up  History of Present Illness:    Joshua Stanton is a 58 y.o. male with PMH notable for multiple arrhythmias including PAC, atrial tachycardia/flutter and nonsustained V. tach along with OSA on CPAP who presents via audio/video conferencing for a telehealth visit today. I saw him while in the hospital for consultation back in March 2017.  He was seen a few times in 2018 by Joshua Stanton, Joshua Stanton, but then had not been seen until February 2020 by Joshua Sims, NP.  Joshua Stanton was last seen on September 30, 2018 by Dr. Rayann Stanton for arrhythmias after being seen by Joshua Sims, NP.  He was noting heart rates up as high as the 170s.  He had episodes of NSVT PAT and nonsustained atrial tachycardia by previous event monitor evaluation and started on beta-blocker.  Was tolerating beta-blocker well.  Dr. Rayann Stanton advised  against antiarrhythmics at that time, but would consider the possibility of flecainide if needed.  Also recommended avoiding anticoagulation unless arrhythmia burden would increase.  Interval History:  For the most part Joshua Stanton is doing relatively well.  He had been an hour of epigastric discomfort with some irregular heartbeats about a week ago.  He took an extra dose of metoprolol, and that went away within about an hour.  Otherwise he is really been noticing GI issues and is felt to maybe potentially have an ulcer.  He is been evaluated by GI medicine.  He really says that being on the beta-blocker has notably improved his palpitation spells.  He is not having any syncope or near syncope type symptoms.  No true anginal symptoms with rest or exertion.  No heart failure symptoms.  Cardiovascular ROS: no chest pain or dyspnea on exertion positive for - irregular heartbeat, palpitations and Epigastric pain negative for - edema, orthopnea, paroxysmal nocturnal dyspnea, rapid heart rate, shortness of breath or Syncope/near syncope, TIA shows amaurosis fugax.  The patient does not have symptoms concerning for COVID-19 infection (fever, chills, cough, or new shortness of breath).  The patient is practicing social distancing.  ROS:  Please see the history of present illness.    Anxiety GI issues - seeing Dr. Michail Stanton Review of Systems  Constitutional: Negative for malaise/fatigue and weight loss.  HENT: Negative for congestion and nosebleeds.   Respiratory: Negative for cough.   Gastrointestinal: Positive for abdominal pain (Epigastric discomfort with eating.  Radiates to the back.) and nausea (After  eating). Negative for blood in stool and melena.  Genitourinary: Negative for dysuria, flank pain and hematuria.  Psychiatric/Behavioral: The patient is nervous/anxious (Has been more anxious than usual since the beginning of the COVID issues.).   All other systems reviewed and are negative.   Past  Medical History:  Diagnosis Date  . Arthritis    "lower back" (09/28/2015)  . Atypical angina (Joshua Stanton) dx'd 09/2015  . GERD (gastroesophageal reflux disease)   . High cholesterol   . History of hiatal hernia   . Hypertension   . OSA on CPAP   . Prediabetes   . Seasonal allergies    Past Surgical History:  Procedure Laterality Date  . ANKLE HARDWARE REMOVAL Left 2006   left   . CARDIAC CATHETERIZATION N/A 09/28/2015   Procedure: Left Heart Cath and Coronary Angiography;  Surgeon: Wellington Hampshire, MD;  Location: [facility];  Service: Cardiovascular;: For possible atypical angina, mildly elevated troponins.  Angiographically normal coronary arteries  . CARDIAC EVENT MONITOR  09/2018   Heart rate range 47 bpm - 190 bpm.  Average heart rate 70 bpm.  Low burden of PACs and PVCs.  3 short runs of SVT/PAT with aberrancy versus V. tach.  25 bursts of PAT 3 episodes of what appear to be atrial tach versus atrial fib with a rate of 190 bpm   . FRACTURE SURGERY    . KNEE ARTHROSCOPY  09/20/2011   Procedure: ARTHROSCOPY KNEE;  Surgeon: Gearlean Alf, MD;  Location: [facility];  Service: Orthopedics;  Laterality: Right;  Medial meniscal DEBRIDEMENT with chondroplasty, right knee  . KNEE ARTHROSCOPY Left 04/07/2013   Procedure: LEFT KNEE ARTHROSCOPY WITH MEDIAL MENISCUS  DEBRIDEMENT ;  Surgeon: Gearlean Alf, MD;  Location: [facility];  Service: Orthopedics;  Laterality: Left;  . LIPOMA EXCISION Right 2004   neck  . NASAL SEPTUM SURGERY  1992  . ORIF ANKLE FRACTURE Left 1981  . TONSILLECTOMY  2008  . TRANSTHORACIC ECHOCARDIOGRAM  09/2015   EF 60-65%. Gr 1 DD. Mod LA dilation.    Marland Kitchen UVULOPALATOPHARYNGOPLASTY  2008     Current Meds  Medication Sig  . aspirin EC 81 MG EC tablet Take 1 tablet (81 mg total) by mouth daily.  Marland Kitchen atorvastatin (LIPITOR) 20 MG tablet Take 1 tablet (20 mg total) by mouth daily at 6 PM.  . LORazepam (ATIVAN) 0.5 MG tablet Take 0.5 mg by mouth as needed.   Marland Kitchen losartan (COZAAR) 100 MG tablet Take 100 mg daily by mouth.  . pantoprazole (PROTONIX) 40 MG tablet Take 1 tablet (40 mg total) by mouth daily. (Patient taking differently: Take 40 mg by mouth 2 (two) times daily. )  . sucralfate (CARAFATE) 1 GM/10ML suspension Take 10 mLs by mouth 4 (four) times daily.  . TESTIM 50 MG/5GM (1%) GEL Apply 5 g topically every other day.   . zolpidem (AMBIEN) 10 MG tablet Take 10 mg by mouth at bedtime as needed for sleep.   . [DISCONTINUED] metoprolol tartrate (LOPRESSOR) 25 MG tablet Take 0.5 tablets (12.5 mg total) by mouth daily. May take additional 1/2 tab for palpitations     Allergies:   Oxycodone-acetaminophen   Social History   Tobacco Use  . Smoking status: Never Smoker  . Smokeless tobacco: Former Systems developer    Types: Chew  Substance Use Topics  . Alcohol use: Yes    Comment: 09/28/2014 "couple beers/month"  . Drug use: Yes    Types: Marijuana  Comment: "quit in the 1990s"     Family Hx: The patient's family history includes Dementia in his mother; Hypertension in his mother.   Prior CV studies:   The following studies were reviewed today: . Cath 09/2015: Normal Coronary Arteries . Echo 09/2015: EF 60-65%. Gr 1 DD. Mod LA dilation.   . Event monitor March 2020: Minimum heart rate 47 bpm, maximum 190 bpm.  Average 70 bpm.  Low PAC PVC burden. o 3 short runs of SVT or PAT with aberrancy versus ventricular tachycardia. o 25 bursts of PAT and 3 episodes of what could be atrial tachycardia versus flutter versus fib at a rate 190 bpm.  Labs/Other Tests and Data Reviewed:    EKG:  No ECG reviewed.  Recent Labs: No results found for requested labs within last 8760 hours.   Recent Lipid Panel Lab Results  Component Value Date/Time   CHOL 184 09/21/2015 09:15 AM   TRIG 106 09/21/2015 09:15 AM   HDL 38 (L) 09/21/2015 09:15 AM   CHOLHDL 4.8 09/21/2015 09:15 AM   LDLCALC 125 (H) 09/21/2015 09:15 AM    Wt Readings from Last 3 Encounters:   11/30/18 245 lb (111.1 kg)  09/23/18 260 lb (117.9 kg)  08/19/18 260 lb 6.4 oz (118.1 kg)     Objective:    Vital Signs:  Wt 245 lb (111.1 kg)   BMI 38.37 kg/m   VITAL SIGNS:  reviewed GEN:  Well nourished, well developed male in no acute distress. RESPIRATORY:  normal respiratory effort, symmetric expansion NEURO:  alert and oriented x 3, no obvious focal deficit PSYCH:  normal affect   ASSESSMENT & PLAN:    Problem List Items Addressed This Visit    Abnormal finding on EKG - anterolateral biphasic T waves with ST depression    Abnormal EKG with mildly elevated troponin and somewhat atypical anginal type symptoms evaluated with cardiac catheterization showing no coronary artery disease.      Essential hypertension - Primary (Chronic)    Seems to be tolerating low-dose metoprolol plus losartan.  Basically metoprolol replaced amlodipine. Since he is only taking it once daily, I will switch him from Lopressor to Toprol (metoprolol tartrate to metoprolol succinate) continue current dose of 12.5 mg.      Relevant Medications   metoprolol succinate (TOPROL XL) 25 MG 24 hr tablet   PAT (paroxysmal atrial tachycardia) (HCC) (Chronic)    Symptoms improved with beta-blocker.  Will convert from short acting Lopressor to long-acting Toprol at 12.5 mg daily.  Still okay to take additional half tablet for breakthrough.      Relevant Medications   metoprolol succinate (TOPROL XL) 25 MG 24 hr tablet      COVID-19 Education: The signs and symptoms of COVID-19 were discussed with the patient and how to seek care for testing (follow up with PCP or arrange E-visit).   The importance of social distancing was discussed today.  Time:   Today, I have spent 15 minutes with the patient with telehealth technology discussing the above problems.     Medication Adjustments/Labs and Tests Ordered: Current medicines are reviewed at length with the patient today.  Concerns regarding medicines are  outlined above.  Medication Instructions:   Change Rx of Metoprolol from Metoprolol Tartrate to Metoprolol Succinate 12.5 mg PO (one half of a 25 mg tablet daily.  Still okay to take the additional 1/2 tablet as needed for breakthrough  Continue to ensure getting adequate sleep, staying adequately hydrated.  Avoiding  social stress triggers such as caffeine and sweet foods/  Tests Ordered: No orders of the defined types were placed in this encounter. None  Medication Changes: Meds ordered this encounter  Medications  . metoprolol succinate (TOPROL XL) 25 MG 24 hr tablet    Sig: TAKE 1/2 TABLET BY MOUTH DAILY. OK TO USE 1/2 AS NEEDED FOR TACHYCARDIA/FAST HEART RATE    Dispense:  20 tablet    Refill:  6    D/C METOPROLOL TART  See change metoprolol prescription  Disposition:  Follow up For every 6 months with Joshua Sims, NP    Signed, Joshua Hew, MD  12/01/2018 11:40 AM    [place]

## 2018-11-30 NOTE — Patient Instructions (Addendum)
Medication Instructions:   Change Rx of Metoprolol from Metoprolol Tartrate to Metoprolol Succinate 12.5 mg PO (one half of a 25 mg tablet daily.  Still okay to take the additional 1/2 tablet as needed for breakthrough  Continue to ensure getting adequate sleep, staying adequately hydrated.  Avoiding social stress triggers such as caffeine and sweet foods/  Tests Ordered: No orders of the defined types were placed in this encounter. None  Your physician wants you to follow-up in: 6 months with Joshua Long, DNP You will receive a reminder letter in the mail two months in advance. If you don't receive a letter, please call our office to schedule the follow-up appointment.  Your physician wants you to follow-up in: 1 year with Joshua Stanton will receive a reminder letter in the mail two months in advance. If you don't receive a letter, please call our office to schedule the follow-up appointment.

## 2018-12-01 ENCOUNTER — Encounter: Payer: Self-pay | Admitting: Cardiology

## 2018-12-01 NOTE — Assessment & Plan Note (Signed)
Symptoms improved with beta-blocker.  Will convert from short acting Lopressor to long-acting Toprol at 12.5 mg daily.  Still okay to take additional half tablet for breakthrough.

## 2018-12-01 NOTE — Assessment & Plan Note (Signed)
Abnormal EKG with mildly elevated troponin and somewhat atypical anginal type symptoms evaluated with cardiac catheterization showing no coronary artery disease.

## 2018-12-01 NOTE — Assessment & Plan Note (Addendum)
Seems to be tolerating low-dose metoprolol plus losartan.  Basically metoprolol replaced amlodipine. Since he is only taking it once daily, I will switch him from Lopressor to Toprol (metoprolol tartrate to metoprolol succinate) continue current dose of 12.5 mg.

## 2018-12-11 ENCOUNTER — Ambulatory Visit (HOSPITAL_COMMUNITY)
Admission: RE | Admit: 2018-12-11 | Discharge: 2018-12-11 | Disposition: A | Discharge status: Home / Self Care | Payer: 59 | Source: Ambulatory Visit | Attending: Gastroenterology | Admitting: Gastroenterology

## 2018-12-11 ENCOUNTER — Other Ambulatory Visit: Payer: Self-pay

## 2018-12-11 DIAGNOSIS — R1013 Epigastric pain: Secondary | ICD-10-CM | POA: Diagnosis present

## 2018-12-11 DIAGNOSIS — R110 Nausea: Secondary | ICD-10-CM | POA: Diagnosis present

## 2018-12-11 MED ORDER — TECHNETIUM TC 99M SULFUR COLLOID
2.0000 | Freq: Once | INTRAVENOUS | Status: AC | PRN
  Administered 2018-12-11: 2 via ORAL

## 2019-02-15 ENCOUNTER — Other Ambulatory Visit: Payer: Self-pay | Admitting: Gastroenterology

## 2019-03-02 ENCOUNTER — Other Ambulatory Visit: Payer: Self-pay | Admitting: Gastroenterology

## 2019-03-05 ENCOUNTER — Other Ambulatory Visit (HOSPITAL_COMMUNITY)
Admission: RE | Admit: 2019-03-05 | Discharge: 2019-03-05 | Disposition: A | Discharge status: Home / Self Care | Payer: 59 | Source: Ambulatory Visit | Attending: Gastroenterology | Admitting: Gastroenterology

## 2019-03-05 DIAGNOSIS — K2970 Gastritis, unspecified, without bleeding: Secondary | ICD-10-CM | POA: Diagnosis not present

## 2019-03-05 DIAGNOSIS — Z20828 Contact with and (suspected) exposure to other viral communicable diseases: Secondary | ICD-10-CM | POA: Insufficient documentation

## 2019-03-05 DIAGNOSIS — Z01812 Encounter for preprocedural laboratory examination: Secondary | ICD-10-CM | POA: Insufficient documentation

## 2019-03-05 DIAGNOSIS — K5792 Diverticulitis of intestine, part unspecified, without perforation or abscess without bleeding: Secondary | ICD-10-CM | POA: Diagnosis not present

## 2019-03-05 DIAGNOSIS — K219 Gastro-esophageal reflux disease without esophagitis: Secondary | ICD-10-CM | POA: Diagnosis not present

## 2019-03-05 LAB — SARS CORONAVIRUS 2 (TAT 6-24 HRS): SARS Coronavirus 2: NEGATIVE

## 2019-03-08 ENCOUNTER — Encounter (HOSPITAL_COMMUNITY): Payer: Self-pay | Admitting: Emergency Medicine

## 2019-03-08 NOTE — Progress Notes (Signed)
Spoke with patient, has quaratined without symptoms since testing.  Answered questions regarding procedure on 03/09/19.  Arrival time 1000 am.

## 2019-03-08 NOTE — Progress Notes (Signed)
Endo call made . Spoke with patient. Confirmed location and time of procedure tomorrow.    States endo nurse spoke with him earlier today and advised on him procedure tomorrow and told him his wife could wait in the endo waiting room.

## 2019-03-09 ENCOUNTER — Ambulatory Visit (HOSPITAL_COMMUNITY)
Admission: RE | Admit: 2019-03-09 | Discharge: 2019-03-09 | Disposition: A | Discharge status: Home / Self Care | Payer: 59 | Attending: Gastroenterology | Admitting: Gastroenterology

## 2019-03-09 ENCOUNTER — Other Ambulatory Visit: Payer: Self-pay

## 2019-03-09 ENCOUNTER — Encounter (HOSPITAL_COMMUNITY)
Admission: RE | Disposition: A | Discharge status: Home / Self Care | Payer: Self-pay | Source: Home / Self Care | Attending: Gastroenterology

## 2019-03-09 ENCOUNTER — Ambulatory Visit (HOSPITAL_COMMUNITY): Payer: 59 | Admitting: Anesthesiology

## 2019-03-09 ENCOUNTER — Encounter (HOSPITAL_COMMUNITY): Payer: Self-pay | Admitting: Certified Registered"

## 2019-03-09 DIAGNOSIS — R110 Nausea: Secondary | ICD-10-CM | POA: Diagnosis present

## 2019-03-09 DIAGNOSIS — K31819 Angiodysplasia of stomach and duodenum without bleeding: Secondary | ICD-10-CM | POA: Diagnosis not present

## 2019-03-09 DIAGNOSIS — K219 Gastro-esophageal reflux disease without esophagitis: Secondary | ICD-10-CM | POA: Diagnosis present

## 2019-03-09 DIAGNOSIS — K640 First degree hemorrhoids: Secondary | ICD-10-CM | POA: Diagnosis not present

## 2019-03-09 DIAGNOSIS — G4730 Sleep apnea, unspecified: Secondary | ICD-10-CM | POA: Diagnosis not present

## 2019-03-09 DIAGNOSIS — K2900 Acute gastritis without bleeding: Secondary | ICD-10-CM | POA: Insufficient documentation

## 2019-03-09 DIAGNOSIS — M1990 Unspecified osteoarthritis, unspecified site: Secondary | ICD-10-CM | POA: Insufficient documentation

## 2019-03-09 DIAGNOSIS — I10 Essential (primary) hypertension: Secondary | ICD-10-CM | POA: Diagnosis not present

## 2019-03-09 DIAGNOSIS — R1013 Epigastric pain: Secondary | ICD-10-CM | POA: Diagnosis not present

## 2019-03-09 DIAGNOSIS — I209 Angina pectoris, unspecified: Secondary | ICD-10-CM | POA: Diagnosis not present

## 2019-03-09 DIAGNOSIS — K5730 Diverticulosis of large intestine without perforation or abscess without bleeding: Secondary | ICD-10-CM | POA: Diagnosis not present

## 2019-03-09 HISTORY — PX: COLONOSCOPY WITH PROPOFOL: SHX5780

## 2019-03-09 HISTORY — PX: BIOPSY: SHX5522

## 2019-03-09 HISTORY — PX: ESOPHAGOGASTRODUODENOSCOPY (EGD) WITH PROPOFOL: SHX5813

## 2019-03-09 HISTORY — DX: Palpitations: R00.2

## 2019-03-09 SURGERY — COLONOSCOPY WITH PROPOFOL
Anesthesia: General

## 2019-03-09 MED ORDER — SODIUM CHLORIDE 0.9 % IV SOLN: INTRAVENOUS | Status: DC

## 2019-03-09 MED ORDER — PROPOFOL 10 MG/ML IV BOLUS
INTRAVENOUS | Status: AC
  Filled 2019-03-09: qty 20

## 2019-03-09 MED ORDER — LACTATED RINGERS IV SOLN
INTRAVENOUS | Status: DC
  Administered 2019-03-09: 10:00:00 via INTRAVENOUS

## 2019-03-09 MED ORDER — LIDOCAINE 2% (20 MG/ML) 5 ML SYRINGE
INTRAMUSCULAR | Status: DC | PRN
  Administered 2019-03-09: 60 mg via INTRAVENOUS
  Administered 2019-03-09: 40 mg via INTRAVENOUS

## 2019-03-09 MED ORDER — PROPOFOL 10 MG/ML IV BOLUS
INTRAVENOUS | Status: DC | PRN
  Administered 2019-03-09: 20 mg via INTRAVENOUS
  Administered 2019-03-09: 40 mg via INTRAVENOUS
  Administered 2019-03-09 (×4): 20 mg via INTRAVENOUS
  Administered 2019-03-09 (×2): 40 mg via INTRAVENOUS
  Administered 2019-03-09: 20 mg via INTRAVENOUS
  Administered 2019-03-09: 40 mg via INTRAVENOUS
  Administered 2019-03-09: 20 mg via INTRAVENOUS

## 2019-03-09 SURGICAL SUPPLY — 24 items

## 2019-03-09 NOTE — Discharge Instructions (Signed)
Will call you when pathology results are complete. °

## 2019-03-09 NOTE — Op Note (Signed)
[facility] Patient Name: Joshua Stanton Procedure Date: 03/09/2019 MRN: [id number] Attending MD: Lear Ng , MD Date of Birth: [birth year] CSN: [id number] Age: 58 Admit Type: Inpatient Procedure:                Colonoscopy Indications:              Last colonoscopy: February 2016, Epigastric                            abdominal pain Providers:                Lear Ng, MD, Angus Seller, Ladona Ridgel, Technician Referring MD:             [name] Medicines:                Propofol per Anesthesia, Monitored Anesthesia Care Complications:            No immediate complications. Estimated Blood Loss:     Estimated blood loss: none. Procedure:                Pre-Anesthesia Assessment:                           - Prior to the procedure, a History and Physical                            was performed, and patient medications and                            allergies were reviewed. The patient's tolerance of                            previous anesthesia was also reviewed. The risks                            and benefits of the procedure and the sedation                            options and risks were discussed with the patient.                            All questions were answered, and informed consent                            was obtained. Prior Anticoagulants: The patient has                            taken no previous anticoagulant or antiplatelet                            agents. ASA Grade Assessment: III - A patient with  severe systemic disease. After reviewing the risks                            and benefits, the patient was deemed in                            satisfactory condition to undergo the procedure.                           After obtaining informed consent, the colonoscope                            was passed under direct vision. Throughout the          procedure, the patient's blood pressure, pulse, and                            oxygen saturations were monitored continuously. The                            [id number] Olympus pediatric colonscope                            was introduced through the anus and advanced to the                            the cecum, identified by appendiceal orifice and                            ileocecal valve. The colonoscopy was performed                            without difficulty. The patient tolerated the                            procedure well. The quality of the bowel                            preparation was adequate and good. The ileocecal                            valve, appendiceal orifice, and rectum were                            photographed. Scope In: 11:44:23 AM Scope Out: 11:53:02 AM Scope Withdrawal Time: 0 hours 6 minutes 12 seconds  Total Procedure Duration: 0 hours 8 minutes 39 seconds  Findings:      The perianal and digital rectal examinations were normal.      A few small-mouthed diverticula were found in the sigmoid colon.      Internal hemorrhoids were found during retroflexion. The hemorrhoids       were medium-sized and Grade I (internal hemorrhoids that do not       prolapse). Impression:               - Diverticulosis in the sigmoid colon.                           -  Internal hemorrhoids.                           - No specimens collected. Moderate Sedation:      Not Applicable - Patient had care per Anesthesia. Recommendation:           - Repeat colonoscopy in 10 years for screening                            purposes.                           - Continue present medications.                           - High fiber diet. Procedure Code(s):        --- Professional ---                           [phone], Colonoscopy, flexible; diagnostic, including                            collection of specimen(s) by brushing or washing,                             when performed (separate procedure) Diagnosis Code(s):        --- Professional ---                           R10.13, Epigastric pain                           K57.30, Diverticulosis of large intestine without                            perforation or abscess without bleeding                           K64.0, First degree hemorrhoids CPT copyright 2019 American [employer] Association. All rights reserved. The codes documented in this report are preliminary and upon coder review may  be revised to meet current compliance requirements. Lear Ng, MD 03/09/2019 12:07:53 PM This report has been signed electronically. Number of Addenda: 0

## 2019-03-09 NOTE — Op Note (Signed)
[facility] Patient Name: Joshua Stanton Procedure Date: 03/09/2019 MRN: [id number] Attending MD: Lear Ng , MD Date of Birth: 10/10/1960 CSN: [id number] Age: 58 Admit Type: Inpatient Procedure:                Upper GI endoscopy Indications:              Epigastric abdominal pain, Esophageal reflux, Nausea Providers:                Lear Ng, MD, Merlinda Frederick, Technician Referring MD:             [name] Medicines:                Propofol per Anesthesia, Monitored Anesthesia Care Complications:            No immediate complications. Estimated Blood Loss:     Estimated blood loss was minimal. Procedure:                Pre-Anesthesia Assessment:                           - Prior to the procedure, a History and Physical                            was performed, and patient medications and                            allergies were reviewed. The patient's tolerance of                            previous anesthesia was also reviewed. The risks                            and benefits of the procedure and the sedation                            options and risks were discussed with the patient.                            All questions were answered, and informed consent                            was obtained. Prior Anticoagulants: The patient has                            taken no previous anticoagulant or antiplatelet                            agents. ASA Grade Assessment: III - A patient with                            severe systemic disease. After reviewing the risks  and benefits, the patient was deemed in                            satisfactory condition to undergo the procedure.                           After obtaining informed consent, the endoscope was                            passed under direct vision. Throughout the                            procedure, the  patient's blood pressure, pulse, and                            oxygen saturations were monitored continuously. The                            [id number] Olympus gastroscope was                            introduced through the mouth, and advanced to the                            second part of duodenum. The upper GI endoscopy was                            accomplished without difficulty. The patient                            tolerated the procedure well. Scope In: Scope Out: Findings:      The examined esophagus was normal.      The Z-line was regular and was found 42 cm from the incisors.      Segmental mild inflammation characterized by congestion (edema) and       erythema was found in the prepyloric region of the stomach. Biopsies       were taken with a cold forceps for histology. Estimated blood loss was       minimal.      The cardia and gastric fundus were normal on retroflexion.      A single small angiodysplastic lesion without bleeding was found in the       second portion of the duodenum.      The exam of the duodenum was otherwise normal. Impression:               - Normal esophagus.                           - Z-line regular, 42 cm from the incisors.                           - Acute gastritis. Biopsied.                           - A single non-bleeding angiodysplastic lesion in  the duodenum. Moderate Sedation:      Not Applicable - Patient had care per Anesthesia. Recommendation:           - Await pathology results. Procedure Code(s):        --- Professional ---                           [phone], Esophagogastroduodenoscopy, flexible,                            transoral; with biopsy, single or multiple Diagnosis Code(s):        --- Professional ---                           K29.00, Acute gastritis without bleeding                           R10.13, Epigastric pain                           K21.9, Gastro-esophageal reflux disease  without                            esophagitis                           K31.819, Angiodysplasia of stomach and duodenum                            without bleeding                           R11.0, Nausea CPT copyright 2019 American Medical Association. All rights reserved. The codes documented in this report are preliminary and upon coder review may  be revised to meet current compliance requirements. Lear Ng, MD 03/09/2019 12:04:03 PM This report has been signed electronically. Number of Addenda: 0

## 2019-03-09 NOTE — Anesthesia Procedure Notes (Signed)
Procedure Name: MAC Date/Time: 03/09/2019 11:24 AM Performed by: Cynda Familia, CRNA Pre-anesthesia Checklist: Patient identified, Emergency Drugs available, Suction available, Patient being monitored and Timeout performed Patient Re-evaluated:Patient Re-evaluated prior to induction Oxygen Delivery Method: Nasal cannula Airway Equipment and Method: Bite block Placement Confirmation: positive ETCO2 and breath sounds checked- equal and bilateral Dental Injury: Teeth and Oropharynx as per pre-operative assessment

## 2019-03-09 NOTE — H&P (Signed)
Date of Initial H&P: 03/02/19  History reviewed, patient examined, no change in status, stable for surgery.

## 2019-03-09 NOTE — Anesthesia Postprocedure Evaluation (Signed)
Anesthesia Post Note  Patient: Kale Rondeau [place]  Procedure(s) Performed: COLONOSCOPY WITH PROPOFOL (N/A ) ESOPHAGOGASTRODUODENOSCOPY (EGD) WITH PROPOFOL (N/A ) BIOPSY     Patient location during evaluation: Endoscopy Anesthesia Type: MAC Level of consciousness: awake and alert Pain management: pain level controlled Vital Signs Assessment: post-procedure vital signs reviewed and stable Respiratory status: spontaneous breathing, nonlabored ventilation, respiratory function stable and patient connected to nasal cannula oxygen Cardiovascular status: blood pressure returned to baseline and stable Postop Assessment: no apparent nausea or vomiting Anesthetic complications: no    Last Vitals:  Vitals:   03/09/19 1220 03/09/19 1230  BP: (!) 171/84 (!) 165/79  Pulse: 63 (!) 59  Resp: 17 15  Temp:    SpO2: 97% 96%    Last Pain:  Vitals:   03/09/19 1200  TempSrc: Other (Comment)  PainSc:                  [place]

## 2019-03-09 NOTE — Transfer of Care (Signed)
Immediate Anesthesia Transfer of Care Note  Patient: Joshua Stanton  Procedure(s) Performed: COLONOSCOPY WITH PROPOFOL (N/A ) ESOPHAGOGASTRODUODENOSCOPY (EGD) WITH PROPOFOL (N/A ) BIOPSY  Patient Location: PACU  Anesthesia Type:MAC  Level of Consciousness: sedated  Airway & Oxygen Therapy: Patient Spontanous Breathing and Patient connected to face mask oxygen  Post-op Assessment: Report given to RN and Post -op Vital signs reviewed and stable  Post vital signs: Reviewed and stable  Last Vitals:  Vitals Value Taken Time  BP 122/68 03/09/19 1200  Temp    Pulse 78 03/09/19 1200  Resp 16 03/09/19 1200  SpO2 94 % 03/09/19 1200  Vitals shown include unvalidated device data.  Last Pain:  Vitals:   03/09/19 1159  TempSrc:   PainSc: 0-No pain         Complications: No apparent anesthesia complications

## 2019-03-09 NOTE — Anesthesia Preprocedure Evaluation (Addendum)
Anesthesia Evaluation  Patient identified by MRN, date of birth, ID band Patient awake    Reviewed: Allergy & Precautions, NPO status , Patient's Chart, lab work & pertinent test results  Airway Mallampati: II  TM Distance: >3 FB Neck ROM: Full    Dental no notable dental hx. (+) Teeth Intact   Pulmonary sleep apnea and Continuous Positive Airway Pressure Ventilation ,    Pulmonary exam normal breath sounds clear to auscultation       Cardiovascular Exercise Tolerance: Good hypertension, Pt. on medications + angina negative cardio ROS Normal cardiovascular exam+ dysrhythmias  Rhythm:Regular Rate:Normal     Neuro/Psych negative neurological ROS  negative psych ROS   GI/Hepatic Neg liver ROS, GERD  ,  Endo/Other  negative endocrine ROS  Renal/GU negative Renal ROS     Musculoskeletal  (+) Arthritis ,   Abdominal   Peds  Hematology negative hematology ROS (+)   Anesthesia Other Findings   Reproductive/Obstetrics                            Anesthesia Physical Anesthesia Plan  ASA: III  Anesthesia Plan: MAC   Post-op Pain Management:    Induction: Intravenous  PONV Risk Score and Plan:   Airway Management Planned: Natural Airway and Nasal Cannula  Additional Equipment:   Intra-op Plan:   Post-operative Plan:   Informed Consent: I have reviewed the patients History and Physical, chart, labs and discussed the procedure including the risks, benefits and alternatives for the proposed anesthesia with the patient or authorized representative who has indicated his/her understanding and acceptance.     Dental advisory given  Plan Discussed with: CRNA  Anesthesia Plan Comments:       Anesthesia Quick Evaluation

## 2019-03-09 NOTE — Interval H&P Note (Signed)
History and Physical Interval Note:  03/09/2019 11:22 AM  Joshua Stanton  has presented today for surgery, with the diagnosis of Epigastric pain/nausea/GERD.  The various methods of treatment have been discussed with the patient and family. After consideration of risks, benefits and other options for treatment, the patient has consented to  Procedure(s): COLONOSCOPY WITH PROPOFOL (N/A) ESOPHAGOGASTRODUODENOSCOPY (EGD) WITH PROPOFOL (N/A) as a surgical intervention.  The patient's history has been reviewed, patient examined, no change in status, stable for surgery.  I have reviewed the patient's chart and labs.  Questions were answered to the patient's satisfaction.     Lear Ng

## 2019-03-09 NOTE — Anesthesia Postprocedure Evaluation (Signed)
Anesthesia Post Note  Patient: Joshua Stanton [facility]  Procedure(s) Performed: COLONOSCOPY WITH PROPOFOL (N/A ) ESOPHAGOGASTRODUODENOSCOPY (EGD) WITH PROPOFOL (N/A ) BIOPSY     Anesthesia Type: MAC    Last Vitals:  Vitals:   03/09/19 1220 03/09/19 1230  BP: (!) 171/84 (!) 165/79  Pulse: 63 (!) 59  Resp: 17 15  Temp:    SpO2: 97% 96%    Last Pain:  Vitals:   03/09/19 1200  TempSrc: Other (Comment)  PainSc:                  Barnet Glasgow

## 2019-03-11 ENCOUNTER — Encounter (HOSPITAL_COMMUNITY): Payer: Self-pay | Admitting: Gastroenterology

## 2019-06-04 ENCOUNTER — Other Ambulatory Visit: Payer: Self-pay | Admitting: Family Medicine

## 2019-06-04 DIAGNOSIS — M546 Pain in thoracic spine: Secondary | ICD-10-CM

## 2019-06-09 NOTE — Telephone Encounter (Signed)
Hello,  Joshua Stanton   It is very good sign that taking Lorazepam helps in your discomfort , It may show that this is not related to your heart, but if your are concerned, we have an available appointment with Dr [name] in the office on Jun 15, 2019 at 11:40 am. I will place you in the appointment time. If this is not suitable please call the office. Also, in the future if you are having chest discomfort  Please contact office by phone. the mychart message may not be reviewed on a daily basis. Thanks  Ivin Booty RN   If symptoms are not relief or become worse call 911 and got ER

## 2019-06-15 ENCOUNTER — Encounter: Payer: Self-pay | Admitting: Cardiology

## 2019-06-15 ENCOUNTER — Other Ambulatory Visit: Payer: Self-pay

## 2019-06-15 ENCOUNTER — Ambulatory Visit: Payer: 59 | Admitting: Cardiology

## 2019-06-15 VITALS — BP 173/96 | HR 65 | Temp 97.3°F | Ht 67.0 in | Wt 257.0 lb

## 2019-06-15 DIAGNOSIS — R9431 Abnormal electrocardiogram [ECG] [EKG]: Secondary | ICD-10-CM

## 2019-06-15 DIAGNOSIS — I10 Essential (primary) hypertension: Secondary | ICD-10-CM | POA: Diagnosis not present

## 2019-06-15 DIAGNOSIS — I471 Supraventricular tachycardia: Secondary | ICD-10-CM

## 2019-06-15 MED ORDER — FLECAINIDE ACETATE 100 MG PO TABS: ORAL_TABLET | ORAL | 6 refills | Status: DC

## 2019-06-15 MED ORDER — METOPROLOL TARTRATE 25 MG PO TABS: ORAL_TABLET | ORAL | 6 refills | Status: DC

## 2019-06-15 MED ORDER — SPIRONOLACTONE 25 MG PO TABS: 12.5000 mg | ORAL_TABLET | Freq: Every day | ORAL | 3 refills | Status: DC

## 2019-06-15 MED ORDER — METOPROLOL SUCCINATE ER 50 MG PO TB24: 50.0000 mg | ORAL_TABLET | Freq: Every day | ORAL | 3 refills | Status: DC

## 2019-06-15 NOTE — Progress Notes (Signed)
Primary Care Provider: [name], MD Cardiologist: Glenetta Hew, MD Electrophysiologist: Thompson Grayer, MD  Clinic Note: Chief Complaint  Patient presents with  . Follow-up    Rapid palpitations  . Tachycardia    PAT  . Hypertension    Uncontrolled    HPI:    Joshua Stanton is a 58 y.o. male with a PMH below who presents today for 61-month follow-up for atrial tachycardia/flutter and PVCs, OSA/CPAP and hypertension.  For his arrhythmias, he was started on beta-blocker.  Dr. Rayann Heman from EP had indicated that he preferred to avoid antiarrhythmic but could use as needed flecainide.  Joshua Stanton was last seen on Dec 01, 2018 via telemedicine.  Doing relatively well.  Had some epigastric discomfort and some irregular heartbeats but otherwise was stable.  Palpitations notably improved on beta-blocker.  Recent Hospitalizations: n/a - Had bout of COVID in June (he, his wife, dtr & mother-in-law-- mother-in-law died, was on HD)  Reviewed  CV studies:    The following studies were reviewed today: (if available, images/films reviewed: From Epic Chart or Care Everywhere) . None:   Interval History:   [name] returns here today earlier than usual because of episodes of chest discomfort and palpitations relieved with lorazepam.  Also noted elevated blood pressures. He says he is been noticing off and on chest discomfort with tachycardia spells with heart rates going into the 130-170 BPM range and the highest was 1 was up to 183.  When this happens he feels lightheaded dizzy and short of breath.  He does feel some discomfort in his chest.  These episodes are most often occurring at the end of the day or in the morning.  The episodes usually happen when he is not actually doing anything.  He also notes that his blood pressures have been a lot higher of late and has been having some mild dyspnea.  No real PND or orthopnea, but he does have some intermittent  edema.  Although he gets lightheaded and dizzy, he has not had any syncope.  Has had some near syncopal spells when the very fast heart rates.  They are relatively self-limiting lasting only 5 to 10 minutes at most.  The symptoms seem to have picked up since he had his running with COVID-19 a few months ago.  (His entire family including his wife and daughter all got Covid -> most notable symptom was cough and feeling fatigued, but then the palpitations got worse).  Without the tachycardia episodes, he does not have any chest discomfort.  Does not necessarily notice any exertional dyspnea at baseline, but if he has having a tachycardia spell he does note more progressive dyspnea.   CV Review of Symptoms (Summary) Cardiovascular ROS: positive for - edema, irregular heartbeat, palpitations, rapid heart rate and Associated with chest tightness, dizziness/near syncope and dyspnea. negative for - loss of consciousness, orthopnea, paroxysmal nocturnal dyspnea or TIA/amaurosis fugax; claudication  The patient does not have symptoms concerning for COVID-19 infection (fever, chills, cough, or new shortness of breath).  The patient is practicing social distancing. ++ Masking.  He does go out for groceries/shopping.  Stays protected at work.  He thinks he got Covid from a family member that they got to get together with for a birthday celebration.   REVIEWED OF SYSTEMS   A comprehensive ROS was performed. Review of Systems  Constitutional: Positive for malaise/fatigue (When he is having palpitations). Negative for weight loss.  Respiratory: Positive for shortness of  breath (Per HPI). Negative for cough and wheezing.   Cardiovascular: Positive for palpitations and leg swelling (Trivial).  Gastrointestinal: Negative for blood in stool, heartburn and melena.  Genitourinary: Negative for hematuria.  Musculoskeletal: Positive for back pain and joint pain.  Neurological: Positive for dizziness. Negative for  focal weakness and weakness.  Psychiatric/Behavioral: Negative for depression and memory loss. The patient is nervous/anxious (Has been a little anxious with his rapid heart rates). The patient does not have insomnia.   All other systems reviewed and are negative.  I have reviewed and (if needed) personally updated the patient's problem list, medications, allergies, past medical and surgical history, social and family history.   PAST MEDICAL HISTORY   Past Medical History:  Diagnosis Date  . Arthritis    "lower back" (09/28/2015)  . Atypical angina ([place]) dx'd 09/2015  . GERD (gastroesophageal reflux disease)   . High cholesterol   . History of hiatal hernia   . Hypertension   . OSA on CPAP   . Palpitation    last occurrence saturday 03-06-2019 , per patient , ame and went quickly, took his metoprolol at first sign of it . no reoccurence since that time   . Prediabetes   . Seasonal allergies      PAST SURGICAL HISTORY   Past Surgical History:  Procedure Laterality Date  . ANKLE HARDWARE REMOVAL Left 2006   left   . BIOPSY  03/09/2019   Procedure: BIOPSY;  Surgeon: Wilford Corner, MD;  Location: [facility];  Service: Endoscopy;;  . CARDIAC CATHETERIZATION N/A 09/28/2015   Procedure: Left Heart Cath and Coronary Angiography;  Surgeon: Wellington Hampshire, MD;  Location: [place] [facility];  Service: Cardiovascular;: For possible atypical angina, mildly elevated troponins.  Angiographically normal coronary arteries  . CARDIAC EVENT MONITOR  09/2018   Heart rate range 47 bpm - 190 bpm.  Average heart rate 70 bpm.  Low burden of PACs and PVCs.  3 short runs of SVT/PAT with aberrancy versus V. tach.  25 bursts of PAT 3 episodes of what appear to be atrial tach versus atrial fib with a rate of 190 bpm   . COLONOSCOPY WITH PROPOFOL N/A 03/09/2019   Procedure: COLONOSCOPY WITH PROPOFOL;  Surgeon: Wilford Corner, MD;  Location: [facility];  Service: Endoscopy;  Laterality: N/A;  .  ESOPHAGOGASTRODUODENOSCOPY (EGD) WITH PROPOFOL N/A 03/09/2019   Procedure: ESOPHAGOGASTRODUODENOSCOPY (EGD) WITH PROPOFOL;  Surgeon: Wilford Corner, MD;  Location: [facility];  Service: Endoscopy;  Laterality: N/A;  . FRACTURE SURGERY    . KNEE ARTHROSCOPY  09/20/2011   Procedure: ARTHROSCOPY KNEE;  Surgeon: Gearlean Alf, MD;  Location: [facility];  Service: Orthopedics;  Laterality: Right;  Medial meniscal DEBRIDEMENT with chondroplasty, right knee  . KNEE ARTHROSCOPY Left 04/07/2013   Procedure: LEFT KNEE ARTHROSCOPY WITH MEDIAL MENISCUS  DEBRIDEMENT ;  Surgeon: Gearlean Alf, MD;  Location: [facility];  Service: Orthopedics;  Laterality: Left;  . LIPOMA EXCISION Right 2004   neck  . NASAL SEPTUM SURGERY  1992  . ORIF ANKLE FRACTURE Left 1981  . TONSILLECTOMY  2008  . TRANSTHORACIC ECHOCARDIOGRAM  09/2015   EF 60-65%. Gr 1 DD. Mod LA dilation.    Marland Kitchen UVULOPALATOPHARYNGOPLASTY  2008     MEDICATIONS/ALLERGIES   Current Meds  Medication Sig  . aspirin EC 81 MG EC tablet Take 1 tablet (81 mg total) by mouth daily.  Marland Kitchen LORazepam (ATIVAN) 1 MG tablet Take 1 mg by mouth every 8 (  eight) hours as needed for anxiety.  Marland Kitchen losartan (COZAAR) 100 MG tablet Take 100 mg daily by mouth.  . Multiple Vitamin (MULTIVITAMIN WITH MINERALS) TABS tablet Take 1 tablet by mouth daily.  . ondansetron (ZOFRAN-ODT) 4 MG disintegrating tablet Take 4 mg by mouth 3 (three) times daily as needed.  Marland Kitchen oxymetazoline (AFRIN) 0.05 % nasal spray Place 1 spray into both nostrils at bedtime. Needed for CPAP  . pantoprazole (PROTONIX) 40 MG tablet Take 1 tablet (40 mg total) by mouth daily. (Patient taking differently: Take 40 mg by mouth 2 (two) times daily before a meal. )  . Probiotic Product (ALIGN) 4 MG CAPS Take 4 mg by mouth 2 (two) times daily.  . sertraline (ZOLOFT) 50 MG tablet Take 50 mg by mouth daily.  . TESTIM 50 MG/5GM (1%) GEL Apply 5 g topically every other day.   . traZODone (DESYREL) 50 MG  tablet Take 50 mg by mouth at bedtime as needed.  . [DISCONTINUED] atorvastatin (LIPITOR) 20 MG tablet Take 1 tablet (20 mg total) by mouth daily at 6 PM. (Patient taking differently: Take 20 mg by mouth daily. )  . [DISCONTINUED] metoprolol succinate (TOPROL XL) 25 MG 24 hr tablet TAKE 1/2 TABLET BY MOUTH DAILY. OK TO USE 1/2 AS NEEDED FOR TACHYCARDIA/FAST HEART RATE (Patient taking differently: Take 12.5 mg by mouth daily as needed (Heart rate > 130). )  . [DISCONTINUED] ondansetron (ZOFRAN) 4 MG tablet Take 4 mg by mouth every 8 (eight) hours as needed for nausea or vomiting.  . [DISCONTINUED] zolpidem (AMBIEN) 10 MG tablet Take 10 mg by mouth at bedtime as needed for sleep.     Allergies  Allergen Reactions  . Oxycodone Nausea Only     SOCIAL HISTORY/FAMILY HISTORY   Social History   Tobacco Use  . Smoking status: Never Smoker  . Smokeless tobacco: Former Systems developer    Types: Chew  Substance Use Topics  . Alcohol use: Yes    Comment: 09/28/2014 "couple beers/month"  . Drug use: Yes    Types: Marijuana    Comment: "quit in the 1990s"   Social History   Social History Narrative  . Not on file    Family History family history includes Dementia in his mother; Hypertension in his mother.   OBJCTIVE -PE, EKG, labs   Wt Readings from Last 3 Encounters:  06/15/19 257 lb (116.6 kg)  03/09/19 243 lb (110.2 kg)  11/30/18 245 lb (111.1 kg)    Physical Exam: BP (!) 173/96   Pulse 65   Temp (!) 97.3 F (36.3 C)   Ht 5\' 7"  (1.702 m)   Wt 257 lb (116.6 kg)   SpO2 94%   BMI 40.25 kg/m  Physical Exam  Constitutional: He is oriented to person, place, and time. He appears well-developed and well-nourished. No distress.  Morbidly obese but otherwise healthy-appearing.  Well-groomed.  HENT:  Head: Normocephalic and atraumatic.  Neck: Normal range of motion. Neck supple. No hepatojugular reflux and no JVD present. Carotid bruit is not present. No tracheal deviation present.   Cardiovascular: Normal rate, regular rhythm, S1 normal, S2 normal and intact distal pulses.  No extrasystoles are present. PMI is not displaced (Difficult to palpate). Exam reveals distant heart sounds. Exam reveals no gallop and no friction rub.  No murmur heard. Pulmonary/Chest: Effort normal and breath sounds normal. No respiratory distress. He has no wheezes. He has no rales.  Abdominal: Bowel sounds are normal. He exhibits no distension. There is no  rebound.  Obese.  Unable assess HSM  Musculoskeletal: Normal range of motion.        General: Edema (Trivial ankle) present.  Neurological: He is alert and oriented to person, place, and time.  Psychiatric: He has a normal mood and affect. His behavior is normal. Judgment and thought content normal.  Vitals reviewed.    Adult ECG Report  Rate: 68 ;  Rhythm: normal sinus rhythm and LVH with repolarization.  Significant T wave inversions, unchanged;   Narrative Interpretation: Stable EKG  Recent Labs: May 12, 2019: TC 189, TG 82, LDL 128, HDL 45.  A1c 5.8.  CR 1.06.   ASSESSMENT/PLAN    Problem List Items Addressed This Visit    Essential hypertension (Chronic)    Not as well-controlled today as it was before. We had replaced amlodipine with Toprol maybe that was not enough. He is already on max dose losartan which could be converted to a more potent ARB if necessary. Plan: Increase Toprol to 50 mg daily.    Since he is having some mild edema, we will add spironolactone 25 mg daily-check labs 2 weeks later.      Relevant Medications   metoprolol tartrate (LOPRESSOR) 25 MG tablet   flecainide (TAMBOCOR) 100 MG tablet   metoprolol succinate (TOPROL-XL) 50 MG 24 hr tablet   spironolactone (ALDACTONE) 25 MG tablet   Abnormal finding on EKG - anterolateral biphasic T waves with ST depression (Chronic)    Grossly abnormal EKG but with relative normal echocardiogram and cardiac cath, not consistent with ischemia.  Need to refer to  prior EKGs when comparing.  He has been given a copy.      Relevant Orders   EKG 12-Lead (Completed)   Basic metabolic panel   PAT (paroxysmal atrial tachycardia) (HCC) - Primary (Chronic)    Unfortunately, these episodes are getting worse.  This also associated with worsening hypertension and symptoms. Plan: Increase Toprol dose to 50 mg daily.  We will use as needed short acting Lopressor 25 mg as well as flecainide to 1 mg for breakthrough tachycardia.  This will be for prolonged episodes      Relevant Medications   metoprolol tartrate (LOPRESSOR) 25 MG tablet   flecainide (TAMBOCOR) 100 MG tablet   metoprolol succinate (TOPROL-XL) 50 MG 24 hr tablet   spironolactone (ALDACTONE) 25 MG tablet   Other Relevant Orders   EKG 12-Lead (Completed)   Basic metabolic panel       [id number] Education: The signs and symptoms of COVID-19 were discussed with the patient and how to seek care for testing (follow up with PCP or arrange E-visit).   The importance of social distancing was discussed today.  I spent a total of 80minutes with the patient and chart review. >  50% of the time was spent in direct patient consultation.  The patient had multiple questions and kept asking intermittently questions that were somewhat tangential in nature.  Also spent some time discussing his COVID-19 illness and follow-up beyond it. Additional time spent with chart review (studies, outside notes, etc): 8 Total Time: 38 min   Current medicines are reviewed at length with the patient today.  (+/- concerns) n/a   Patient Instructions / Medication Changes & Studies & Tests Ordered   Patient Instructions  Medication Instructions:   INCREASE TOPROL ( METOPROLOL SUCCINATE) 50 MG DAILY   START SPIRONOLACTONE 25 MG DAILY      AS NEEDED MEDICATION:  METOPROLOL TARTRATE (LOPRESSOR )  25 MG  AND flecainide 200 MG FOR BREAK THROUGH  TACHYCARDIA ( FAST HEART RATE )   *If you need a refill on your cardiac  medications before your next appointment, please call your pharmacy*  Lab Work: BMP -  2 WEEKS AFTER STARTING  12.5 MG SPIRONOLACTONE.  If you have labs (blood work) drawn today and your tests are completely normal, you will receive your results only by: Marland Kitchen MyChart Message (if you have MyChart) OR . A paper copy in the mail If you have any lab test that is abnormal or we need to change your treatment, we will call you to review the results.  Testing/Procedures: Not needed  Follow-Up: At [facility], you and your health needs are our priority.  As part of our continuing mission to provide you with exceptional heart care, we have created designated Provider Care Teams.  These Care Teams include your primary Cardiologist (physician) and Advanced Practice Providers (APPs -  Physician Assistants and Nurse Practitioners) who all work together to provide you with the care you need, when you need it.  Your next appointment:   6 month(s)  The format for your next appointment:   In Person  Provider:   Glenetta Hew, MD  Other Instructions     Studies Ordered:   Orders Placed This Encounter  Procedures  . Basic metabolic panel  . EKG 12-Lead     Glenetta Hew, M.D., M.S. Interventional Cardiologist   Pager # [phone] Phone # [phone] [address] [place], [place] [address]   Thank you for choosing Heartcare at [facility]!!

## 2019-06-15 NOTE — Patient Instructions (Signed)
Medication Instructions:   INCREASE TOPROL ( METOPROLOL SUCCINATE) 50 MG DAILY   START SPIRONOLACTONE 25 MG DAILY      AS NEEDED MEDICATION:  METOPROLOL TARTRATE (LOPRESSOR )  25 MG  AND flecainide 200 MG FOR BREAK THROUGH  TACHYCARDIA ( FAST HEART RATE )   *If you need a refill on your cardiac medications before your next appointment, please call your pharmacy*  Lab Work: BMP -  2 WEEKS AFTER STARTING  12.5 MG SPIRONOLACTONE.  If you have labs (blood work) drawn today and your tests are completely normal, you will receive your results only by: Marland Kitchen MyChart Message (if you have MyChart) OR . A paper copy in the mail If you have any lab test that is abnormal or we need to change your treatment, we will call you to review the results.  Testing/Procedures: Not needed  Follow-Up: At [facility], you and your health needs are our priority.  As part of our continuing mission to provide you with exceptional heart care, we have created designated Provider Care Teams.  These Care Teams include your primary Cardiologist (physician) and Advanced Practice Providers (APPs -  Physician Assistants and Nurse Practitioners) who all work together to provide you with the care you need, when you need it.  Your next appointment:   6 month(s)  The format for your next appointment:   In Person  Provider:   Glenetta Hew, MD  Other Instructions

## 2019-06-16 ENCOUNTER — Encounter: Payer: Self-pay | Admitting: Cardiology

## 2019-06-16 NOTE — Assessment & Plan Note (Addendum)
Not as well-controlled today as it was before. We had replaced amlodipine with Toprol maybe that was not enough. He is already on max dose losartan which could be converted to a more potent ARB if necessary. Plan: Increase Toprol to 50 mg daily.    Since he is having some mild edema, we will add spironolactone 25 mg daily-check labs 2 weeks later.

## 2019-06-16 NOTE — Assessment & Plan Note (Signed)
Grossly abnormal EKG but with relative normal echocardiogram and cardiac cath, not consistent with ischemia.  Need to refer to prior EKGs when comparing.  He has been given a copy.

## 2019-06-16 NOTE — Assessment & Plan Note (Signed)
Unfortunately, these episodes are getting worse.  This also associated with worsening hypertension and symptoms. Plan: Increase Toprol dose to 50 mg daily.  We will use as needed short acting Lopressor 25 mg as well as flecainide to 1 mg for breakthrough tachycardia.  This will be for prolonged episodes

## 2019-07-02 ENCOUNTER — Other Ambulatory Visit: Payer: Self-pay

## 2019-07-02 ENCOUNTER — Ambulatory Visit
Admission: RE | Admit: 2019-07-02 | Discharge: 2019-07-02 | Disposition: A | Discharge status: Home / Self Care | Payer: 59 | Source: Ambulatory Visit | Attending: Family Medicine | Admitting: Family Medicine

## 2019-07-02 DIAGNOSIS — M546 Pain in thoracic spine: Secondary | ICD-10-CM

## 2019-07-30 DIAGNOSIS — M25562 Pain in left knee: Secondary | ICD-10-CM | POA: Insufficient documentation

## 2019-09-01 LAB — BASIC METABOLIC PANEL
BUN/Creatinine Ratio: 9 (ref 9–20)
BUN: 12 mg/dL (ref 6–24)
CO2: 25 mmol/L (ref 20–29)
Calcium: 9.1 mg/dL (ref 8.7–10.2)
Chloride: 104 mmol/L (ref 96–106)
Creatinine, Ser: 1.35 mg/dL — ABNORMAL HIGH (ref 0.76–1.27)
GFR calc Af Amer: 67 mL/min/{1.73_m2} (ref >59)
GFR calc non Af Amer: 58 mL/min/{1.73_m2} — ABNORMAL LOW (ref >59)
Glucose: 93 mg/dL (ref 65–99)
Potassium: 4 mmol/L (ref 3.5–5.2)
Sodium: 144 mmol/L (ref 134–144)

## 2019-10-01 ENCOUNTER — Other Ambulatory Visit: Payer: Self-pay

## 2019-10-01 DIAGNOSIS — R9431 Abnormal electrocardiogram [ECG] [EKG]: Secondary | ICD-10-CM

## 2019-10-01 DIAGNOSIS — Z79899 Other long term (current) drug therapy: Secondary | ICD-10-CM

## 2019-10-01 DIAGNOSIS — I471 Supraventricular tachycardia: Secondary | ICD-10-CM

## 2019-10-04 NOTE — Telephone Encounter (Signed)
Still as far as it being a reaction to the Covid vaccine, I would say probably not, but cannot be sure.  If these episodes are happening little more frequently right now, but I would say we should do is take one half a tablet twice a day along with long-acting metoprolol for the next 3 weeks until the next shot is done.  If necessary, we can call in a prescription for flecainide 50 mg twice daily-1 month supply - 3 refills.  That we can keep his 100 mg tablets separate to use as as needed..  I would tell him to go ahead and plan on getting a second vaccine, because duration for side effect is time-limited, and the benefit of the vaccine is long-lasting.  Hopefully by using the standing flecainide, we can keep these episodes a day.  It may be that we simply need to stay on a full-time antiarrhythmic agent  Glenetta Hew, MD

## 2019-10-05 NOTE — Telephone Encounter (Signed)
Spoke to patient   Information given . Patient would like to wait until calling in 50 mg dose.

## 2019-10-12 ENCOUNTER — Other Ambulatory Visit: Payer: Self-pay

## 2019-10-12 DIAGNOSIS — R9431 Abnormal electrocardiogram [ECG] [EKG]: Secondary | ICD-10-CM

## 2019-10-12 DIAGNOSIS — Z79899 Other long term (current) drug therapy: Secondary | ICD-10-CM

## 2019-10-12 DIAGNOSIS — I471 Supraventricular tachycardia: Secondary | ICD-10-CM

## 2019-10-16 LAB — BASIC METABOLIC PANEL
BUN/Creatinine Ratio: 9 (ref 9–20)
BUN: 11 mg/dL (ref 6–24)
CO2: 24 mmol/L (ref 20–29)
Calcium: 9.3 mg/dL (ref 8.7–10.2)
Chloride: 103 mmol/L (ref 96–106)
Creatinine, Ser: 1.2 mg/dL (ref 0.76–1.27)
GFR calc Af Amer: 77 mL/min/{1.73_m2} (ref >59)
GFR calc non Af Amer: 67 mL/min/{1.73_m2} (ref >59)
Glucose: 98 mg/dL (ref 65–99)
Potassium: 3.8 mmol/L (ref 3.5–5.2)
Sodium: 143 mmol/L (ref 134–144)

## 2019-11-22 ENCOUNTER — Ambulatory Visit: Payer: 59 | Admitting: Allergy and Immunology

## 2019-11-22 ENCOUNTER — Other Ambulatory Visit: Payer: Self-pay

## 2019-11-22 ENCOUNTER — Encounter: Payer: Self-pay | Admitting: Allergy and Immunology

## 2019-11-22 VITALS — BP 128/82 | HR 62 | Temp 98.1°F | Resp 16 | Ht 67.0 in | Wt 268.8 lb

## 2019-11-22 DIAGNOSIS — K9049 Malabsorption due to intolerance, not elsewhere classified: Secondary | ICD-10-CM | POA: Diagnosis not present

## 2019-11-22 DIAGNOSIS — J310 Chronic rhinitis: Secondary | ICD-10-CM

## 2019-11-22 MED ORDER — AZELASTINE HCL 0.1 % NA SOLN: NASAL | 5 refills | Status: DC

## 2019-11-22 NOTE — Assessment & Plan Note (Signed)
All seasonal and perennial aeroallergen skin tests are negative despite a positive histamine control.  Intranasal steroids, intranasal antihistamines, and first generation antihistamines are effective for symptoms associated with non-allergic rhinitis, whereas second generation antihistamines such as cetirizine (Zyrtec), loratadine (Claritin) and fexofenadine (Allegra) have been found to be ineffective for this condition.  A prescription has been provided for azelastine nasal spray, one spray per nostril 1-2 times daily as needed. Proper nasal spray technique has been discussed and demonstrated.  Nasal saline lavage (NeilMed) has been recommended as needed and prior to medicated nasal sprays along with instructions for proper administration.

## 2019-11-22 NOTE — Progress Notes (Signed)
New Patient Note  RE: Joshua Stanton MRN: [id number] DOB: 16-Feb-1961 Date of Office Visit: 11/22/2019  Referring provider: [name], MD Primary care provider: [name], MD  Chief Complaint: Nausea, back pain, headache  History of present illness: Joshua Stanton is a 59 y.o. male seen today in consultation requested by [name], MD.  He reports that over the past 2 years he has experienced episodes of nausea, back pain, and headaches.  These symptoms oftentimes start in the morning and feel "like a hangover" despite not having consumed alcohol the night before.  The symptoms typically start as nausea and back pain and then progress to right-sided headache.  At other times, the symptoms began after a large and/or fatty meal.  He notes that if he eats light meals the symptoms occur less frequently.  However, he states that if he is not having overt nausea and back pain he still feels a "deep ache" in his back.  He has had gastroenterology evaluation which he reports has been negative to date, including CT scan, ultrasound, MRI, HIDA scan, EGD, and colonoscopy.  He has tried gabapentin and a muscle relaxant which he reports helps with the back pain but he is concerned that it is "just a mask" for the underlying problem. He experiences occasional nasal allergy symptoms which are typically adequately controlled with the use of Nettie pot.  No significant seasonal symptom variation has been noted nor have specific environmental triggers been identified.  His nasal allergy symptoms have been more frequent and severe until he had his tonsils removed in 2010.  Assessment and plan: Intolerance, food The patient experiences a constellation of symptoms in the morning and with certain meals.  Food allergen skin testing today revealed reactivity to cottonseed.  With food allergen testing, there is the chance of false positive result.  For now, strictly eliminate all foods  containing cottonseed from the diet and monitor for symptom improvement.  If symptoms improve, continue meticulous avoidance of cottonseed.  If there is no change in symptoms despite elimination of cottonseed, this food may be reintroduced into the diet and further evaluation by gastroenterologist may be warranted.  A prescription has been provided for epinephrine 0.3 mg autoinjector (AuviQ) 2 pack along with instructions for its proper administration.  Nonallergic rhinitis All seasonal and perennial aeroallergen skin tests are negative despite a positive histamine control.  Intranasal steroids, intranasal antihistamines, and first generation antihistamines are effective for symptoms associated with non-allergic rhinitis, whereas second generation antihistamines such as cetirizine (Zyrtec), loratadine (Claritin) and fexofenadine (Allegra) have been found to be ineffective for this condition.  A prescription has been provided for azelastine nasal spray, one spray per nostril 1-2 times daily as needed. Proper nasal spray technique has been discussed and demonstrated.  Nasal saline lavage (NeilMed) has been recommended as needed and prior to medicated nasal sprays along with instructions for proper administration.   Meds ordered this encounter  Medications  . azelastine (ASTELIN) 0.1 % nasal spray    Sig: 1 spray each nostril 1-2 times daily as needed    Dispense:  30 mL    Refill:  5    Diagnostics: Environmental skin testing: Negative despite a positive histamine control. Food allergen skin testing: Reactive to cottonseed.    Physical examination: Blood pressure 128/82, pulse 62, temperature 98.1 F (36.7 C), temperature source Temporal, resp. rate 16, height 5\' 7"  (1.702 m), weight 268 lb 12.8 oz (121.9 kg), SpO2 97 %.  General: Alert, interactive,  in no acute distress. HEENT: TMs pearly gray, turbinates moderately edematous without discharge, post-pharynx erythematous.  Surgically  absent tonsils and uvula. Neck: Supple without lymphadenopathy. Lungs: Clear to auscultation without wheezing, rhonchi or rales. CV: Normal S1, S2 without murmurs. Abdomen: Nondistended, nontender. Skin: Warm and dry, without lesions or rashes. Extremities:  No clubbing, cyanosis or edema. Neuro:   Grossly intact.  Review of systems:  Review of systems negative except as noted in HPI / PMHx or noted below: Review of Systems  Constitutional: Negative.   HENT: Negative.   Eyes: Negative.   Respiratory: Negative.   Cardiovascular: Negative.   Gastrointestinal: Negative.   Genitourinary: Negative.   Musculoskeletal: Negative.   Skin: Negative.   Neurological: Negative.   Endo/Heme/Allergies: Negative.   Psychiatric/Behavioral: Negative.     Past medical history:  Past Medical History:  Diagnosis Date  . Arthritis    "lower back" (09/28/2015)  . Atypical angina ([place]) dx'd 09/2015  . GERD (gastroesophageal reflux disease)   . High cholesterol   . History of hiatal hernia   . Hypertension   . OSA on CPAP   . Palpitation    last occurrence saturday 03-06-2019 , per patient , ame and went quickly, took his metoprolol at first sign of it . no reoccurence since that time   . Prediabetes   . Seasonal allergies     Past surgical history: \ Past Surgical History:  Procedure Laterality Date  . ANKLE HARDWARE REMOVAL Left 2006   left   . BIOPSY  03/09/2019   Procedure: BIOPSY;  Surgeon: Wilford Corner, MD;  Location: [facility];  Service: Endoscopy;;  . CARDIAC CATHETERIZATION N/A 09/28/2015   Procedure: Left Heart Cath and Coronary Angiography;  Surgeon: Wellington Hampshire, MD;  Location: [facility];  Service: Cardiovascular;: For possible atypical angina, mildly elevated troponins.  Angiographically normal coronary arteries  . CARDIAC EVENT MONITOR  09/2018   Heart rate range 47 bpm - 190 bpm.  Average heart rate 70 bpm.  Low burden of PACs and PVCs.  3 short runs of  SVT/PAT with aberrancy versus V. tach.  25 bursts of PAT 3 episodes of what appear to be atrial tach versus atrial fib with a rate of 190 bpm   . COLONOSCOPY WITH PROPOFOL N/A 03/09/2019   Procedure: COLONOSCOPY WITH PROPOFOL;  Surgeon: Wilford Corner, MD;  Location: [facility];  Service: Endoscopy;  Laterality: N/A;  . ESOPHAGOGASTRODUODENOSCOPY (EGD) WITH PROPOFOL N/A 03/09/2019   Procedure: ESOPHAGOGASTRODUODENOSCOPY (EGD) WITH PROPOFOL;  Surgeon: Wilford Corner, MD;  Location: [facility];  Service: Endoscopy;  Laterality: N/A;  . FRACTURE SURGERY    . KNEE ARTHROSCOPY  09/20/2011   Procedure: ARTHROSCOPY KNEE;  Surgeon: Gearlean Alf, MD;  Location: [facility];  Service: Orthopedics;  Laterality: Right;  Medial meniscal DEBRIDEMENT with chondroplasty, right knee  . KNEE ARTHROSCOPY Left 04/07/2013   Procedure: LEFT KNEE ARTHROSCOPY WITH MEDIAL MENISCUS  DEBRIDEMENT ;  Surgeon: Gearlean Alf, MD;  Location: [facility];  Service: Orthopedics;  Laterality: Left;  . LIPOMA EXCISION Right 2004   neck  . NASAL SEPTUM SURGERY  1992  . ORIF ANKLE FRACTURE Left 1981  . TONSILLECTOMY  2008  . TRANSTHORACIC ECHOCARDIOGRAM  09/2015   EF 60-65%. Gr 1 DD. Mod LA dilation.    Marland Kitchen UVULOPALATOPHARYNGOPLASTY  2008    Family history: Family History  Problem Relation Age of Onset  . Hypertension Mother   . Dementia Mother  Social history: Social History   Socioeconomic History  . Marital status: Married    Spouse name: Not on file  . Number of children: Not on file  . Years of education: Not on file  . Highest education level: Not on file  Occupational History  . Not on file  Tobacco Use  . Smoking status: Never Smoker  . Smokeless tobacco: Former Systems developer    Types: Chew  Substance and Sexual Activity  . Alcohol use: Yes    Comment: 09/28/2014 "couple beers/month"  . Drug use: Yes    Types: Marijuana    Comment: "quit in the 1990s"  . Sexual activity: Yes  Other  Topics Concern  . Not on file  Social History Narrative  . Not on file   Social Determinants of Health   Financial Resource Strain:   . Difficulty of Paying Living Expenses:   Food Insecurity:   . Worried About Charity fundraiser in the Last Year:   . Arboriculturist in the Last Year:   Transportation Needs:   . Film/video editor (Medical):   Marland Kitchen Lack of Transportation (Non-Medical):   Physical Activity:   . Days of Exercise per Week:   . Minutes of Exercise per Session:   Stress:   . Feeling of Stress :   Social Connections:   . Frequency of Communication with Friends and Family:   . Frequency of Social Gatherings with Friends and Family:   . Attends Religious Services:   . Active Member of Clubs or Organizations:   . Attends Archivist Meetings:   Marland Kitchen Marital Status:   Intimate Partner Violence:   . Fear of Current or Ex-Partner:   . Emotionally Abused:   Marland Kitchen Physically Abused:   . Sexually Abused:     Environmental History: The patient lives in a 58 year old house with hardwood floors throughout, gassy, and central air.  There is no known mold/water damage in the home.  There are no pets in the home.  He is a non-smoker.  Current Outpatient Medications  Medication Sig Dispense Refill  . aspirin EC 81 MG EC tablet Take 1 tablet (81 mg total) by mouth daily.    . flecainide (TAMBOCOR) 100 MG tablet TAKE 200 MG IF FOR  RAPID HEART RATE AS DIRECTED 20 tablet 6  . LORazepam (ATIVAN) 1 MG tablet Take 1 mg by mouth every 8 (eight) hours as needed for anxiety.    Marland Kitchen losartan (COZAAR) 100 MG tablet Take 100 mg daily by mouth.    . metoprolol tartrate (LOPRESSOR) 25 MG tablet TAKE 25 MG TABLET ALONG WITH 200 MG FLECAINIDE  IF FOR  RAPID HEART RATE AS DIRECTED 20 tablet 6  . Multiple Vitamin (MULTIVITAMIN WITH MINERALS) TABS tablet Take 1 tablet by mouth daily.    . ondansetron (ZOFRAN-ODT) 4 MG disintegrating tablet Take 4 mg by mouth 3 (three) times daily as needed.      Marland Kitchen oxymetazoline (AFRIN) 0.05 % nasal spray Place 1 spray into both nostrils at bedtime. Needed for CPAP    . pantoprazole (PROTONIX) 40 MG tablet Take 1 tablet (40 mg total) by mouth daily. (Patient taking differently: Take 40 mg by mouth 2 (two) times daily before a meal. ) 30 tablet 0  . Probiotic Product (ALIGN) 4 MG CAPS Take 4 mg by mouth 2 (two) times daily.    . sertraline (ZOLOFT) 50 MG tablet Take 50 mg by mouth daily.    Margie Billet  50 MG/5GM (1%) GEL Apply 5 g topically every other day.   5  . traZODone (DESYREL) 50 MG tablet Take 50 mg by mouth at bedtime as needed.    Marland Kitchen azelastine (ASTELIN) 0.1 % nasal spray 1 spray each nostril 1-2 times daily as needed 30 mL 5  . metoprolol succinate (TOPROL-XL) 50 MG 24 hr tablet Take 1 tablet (50 mg total) by mouth daily. Take with or immediately following a meal. 90 tablet 3  . spironolactone (ALDACTONE) 25 MG tablet Take 0.5 tablets (12.5 mg total) by mouth daily. 45 tablet 3   No current facility-administered medications for this visit.    Known medication allergies: Allergies  Allergen Reactions  . Oxycodone Nausea Only    I appreciate the opportunity to take part in Nealy's care. Please do not hesitate to contact me with questions.  Sincerely,   R. Edgar Frisk, MD

## 2019-11-22 NOTE — Assessment & Plan Note (Addendum)
The patient experiences a constellation of symptoms in the morning and with certain meals.  Food allergen skin testing today revealed reactivity to cottonseed.  With food allergen testing, there is the chance of false positive result.  For now, strictly eliminate all foods containing cottonseed from the diet and monitor for symptom improvement.  If symptoms improve, continue meticulous avoidance of cottonseed.  If there is no change in symptoms despite elimination of cottonseed, this food may be reintroduced into the diet and further evaluation by gastroenterologist may be warranted.  A prescription has been provided for epinephrine 0.3 mg autoinjector (AuviQ) 2 pack along with instructions for its proper administration.

## 2019-11-22 NOTE — Patient Instructions (Addendum)
Intolerance, food The patient experiences a constellation of symptoms in the morning and with certain meals.  Food allergen skin testing today revealed reactivity to cottonseed.  With food allergen testing, there is the chance of false positive result.  For now, strictly eliminate all foods containing cottonseed from the diet and monitor for symptom improvement.  If symptoms improve, continue meticulous avoidance of cottonseed.  If there is no change in symptoms despite elimination of cottonseed, this food may be reintroduced into the diet and further evaluation by gastroenterologist may be warranted.  A prescription has been provided for epinephrine 0.3 mg autoinjector (AuviQ) 2 pack along with instructions for its proper administration.  Nonallergic rhinitis All seasonal and perennial aeroallergen skin tests are negative despite a positive histamine control.  Intranasal steroids, intranasal antihistamines, and first generation antihistamines are effective for symptoms associated with non-allergic rhinitis, whereas second generation antihistamines such as cetirizine (Zyrtec), loratadine (Claritin) and fexofenadine (Allegra) have been found to be ineffective for this condition.  A prescription has been provided for azelastine nasal spray, one spray per nostril 1-2 times daily as needed. Proper nasal spray technique has been discussed and demonstrated.  Nasal saline lavage (NeilMed) has been recommended as needed and prior to medicated nasal sprays along with instructions for proper administration.   Follow-up if needed.

## 2019-12-08 ENCOUNTER — Other Ambulatory Visit: Payer: Self-pay | Admitting: Cardiology

## 2020-01-13 ENCOUNTER — Other Ambulatory Visit: Payer: Self-pay

## 2020-01-13 ENCOUNTER — Encounter: Payer: Self-pay | Admitting: Cardiology

## 2020-01-13 ENCOUNTER — Ambulatory Visit: Payer: 59 | Admitting: Cardiology

## 2020-01-13 VITALS — BP 132/80 | HR 62 | Temp 97.6°F | Ht 67.0 in | Wt 267.0 lb

## 2020-01-13 DIAGNOSIS — I471 Supraventricular tachycardia: Secondary | ICD-10-CM

## 2020-01-13 DIAGNOSIS — E7849 Other hyperlipidemia: Secondary | ICD-10-CM | POA: Diagnosis not present

## 2020-01-13 DIAGNOSIS — R0600 Dyspnea, unspecified: Secondary | ICD-10-CM | POA: Diagnosis not present

## 2020-01-13 DIAGNOSIS — R0609 Other forms of dyspnea: Secondary | ICD-10-CM

## 2020-01-13 DIAGNOSIS — R9431 Abnormal electrocardiogram [ECG] [EKG]: Secondary | ICD-10-CM

## 2020-01-13 DIAGNOSIS — I10 Essential (primary) hypertension: Secondary | ICD-10-CM

## 2020-01-13 DIAGNOSIS — R600 Localized edema: Secondary | ICD-10-CM

## 2020-01-13 MED ORDER — ATORVASTATIN CALCIUM 40 MG PO TABS: 40.0000 mg | ORAL_TABLET | Freq: Every day | ORAL | 3 refills | Status: DC

## 2020-01-13 NOTE — Assessment & Plan Note (Signed)
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

## 2020-01-13 NOTE — Patient Instructions (Signed)
Medication Instructions:  Restart  Atorvastatin 40 mg  Daily  *If you need a refill on your cardiac medications before your next appointment, please call your pharmacy*   Lab Work: Lipid  Hepatic - oct 2021  If you have labs (blood work) drawn today and your tests are completely normal, you will receive your results only by:  [place] (if you have MyChart) OR  A paper copy in the mail If you have any lab test that is abnormal or we need to change your treatment, we will call you to review the results.   Testing/Procedures: Not needed   Follow-Up: At [facility], you and your health needs are our priority.  As part of our continuing mission to provide you with exceptional heart care, we have created designated Provider Care Teams.  These Care Teams include your primary Cardiologist (physician) and Advanced Practice Providers (APPs -  Physician Assistants and Nurse Practitioners) who all work together to provide you with the care you need, when you need it.  We recommend signing up for the patient portal called "MyChart".  Sign up information is provided on this After Visit Summary.  MyChart is used to connect with patients for Virtual Visits (Telemedicine).  Patients are able to view lab/test results, encounter notes, upcoming appointments, etc.  Non-urgent messages can be sent to your provider as well.   To learn more about what you can do with MyChart, go to [email or web address].    Your next appointment:   4 month(s)  The format for your next appointment:   In Person  Provider:   Glenetta Hew, MD   Other Instructions

## 2020-01-13 NOTE — Progress Notes (Signed)
[place]  Primary Care Provider: [name], MD Cardiologist: Glenetta Hew, MD Electrophysiologist: Thompson Grayer, MD  Clinic Note: Chief Complaint  Patient presents with  . Follow-up  . Tachycardia    Still having 2 or 3 episodes of fast heart rate per month.  Now they break sooner when takes flecainide.    HPI:    Joshua Stanton is a 59 y.o. male with a PMH notable for OSA/CPAP, PAT/(possible flutter), PVCs as well as hypertension who presents here for 6-63-month follow-up.Marland Kitchen  He was seen by Dr. Rayann Heman from EP for his arrhythmia issues--recommendation was to try to avoid antiarrhythmic unless symptoms worsen, in that case, could consider as needed flecainide.  Joshua Stanton was last seen on June 15, 2019 for an earlier than planned follow-up because of an episode of chest discomfort and palpitations that was relieved with lorazepam.  Also noted some elevated blood pressures.  Was noting off and on chest discomfort and tachycardia spells that were associated with elevated heart rates up to the 170s and 180s.  He noted lightheadedness and dizziness with dyspnea as well as chest tightness.  Episodes happening at rest.  No syncope or near syncope. - Had bout of COVID in June (he, his wife, dtr & mother-in-law-- mother-in-law died, was on HD) -> symptoms picked up after this.  Recent Hospitalizations: n/a   Reviewed  CV studies:    The following studies were reviewed today: (if available, images/films reviewed: From Epic Chart or Care Everywhere) . None:   Interval History:   Joshua Stanton returns here today again noting that he still has it may be 1 or 2 fast heart rate spells where his heart rate will go up into the 180s up and the last one was couple weeks ago.  He actually took his flecainide and it broke pretty quickly.  Usually these episodes do not last too long and this 1 lasted about 20 to 30 minutes.  He noted to getting lightheaded but not having any  syncope or near-syncope.  He generally feels uncomfortable and feels little short of breath with it, but these episodes are definitely happening less frequently now.  All in all, he says the most the status about 2 or 3 a month.  He is not necessarily noticing any real change in his exercise tolerance or having any significant exertional dyspnea.  Edema is pretty well controlled.  CV Review of Symptoms (Summary) Cardiovascular ROS: positive for - irregular heartbeat, palpitations, rapid heart rate and Prolonged spells are associated with some chest tightness and dyspnea-has been happening less frequently; he does have some exertional dyspnea and stable edema. negative for - loss of consciousness, orthopnea, paroxysmal nocturnal dyspnea or TIA/amaurosis fugax; syncope/near syncope; claudication; claudication  The patient DOES NOT have symptoms concerning for COVID-19 infection (fever, chills, cough, or new shortness of breath).  He is practicing social distancing-and has had his vaccine.   REVIEWED OF SYSTEMS   A comprehensive ROS was performed. Review of Systems  Constitutional: Positive for malaise/fatigue (Not at baseline, only when he has fast heart rate spells). Negative for weight loss.  Respiratory: Positive for shortness of breath (Per HPI). Negative for cough and wheezing.   Cardiovascular: Positive for palpitations (Per HPI) and leg swelling (Trivial/stable).  Gastrointestinal: Negative for blood in stool, heartburn and melena.  Genitourinary: Negative for hematuria.  Musculoskeletal: Positive for back pain (He has had lots of back pain off and on and thinks that may be triggering some of his  heart rate spells.  He has been taking gabapentin which does seem to have improved.) and joint pain (Significant right knee pain from osteoarthritis.).  Neurological: Positive for dizziness and headaches. Negative for focal weakness and weakness.  Psychiatric/Behavioral: Negative for depression  and memory loss. The patient is nervous/anxious (Associated with rapid heart rates). The patient does not have insomnia.   All other systems reviewed and are negative.  I have reviewed and (if needed) personally updated the patient's problem list, medications, allergies, past medical and surgical history, social and family history.   PAST MEDICAL HISTORY   Past Medical History:  Diagnosis Date  . Arthritis    "lower back" (09/28/2015)  . Atypical angina ([place]) dx'd 09/2015  . GERD (gastroesophageal reflux disease)   . High cholesterol   . History of hiatal hernia   . Hypertension   . OSA on CPAP   . Palpitation    last occurrence saturday 03-06-2019 , per patient , ame and went quickly, took his metoprolol at first sign of it . no reoccurence since that time   . Prediabetes   . Seasonal allergies      PAST SURGICAL HISTORY   Past Surgical History:  Procedure Laterality Date  . ANKLE HARDWARE REMOVAL Left 2006   left   . BIOPSY  03/09/2019   Procedure: BIOPSY;  Surgeon: Wilford Corner, MD;  Location: [facility];  Service: Endoscopy;;  . CARDIAC CATHETERIZATION N/A 09/28/2015   Procedure: Left Heart Cath and Coronary Angiography;  Surgeon: Wellington Hampshire, MD;  Location: [facility];  Service: Cardiovascular;: For possible atypical angina, mildly elevated troponins.  Angiographically normal coronary arteries  . CARDIAC EVENT MONITOR  09/2018   Heart rate range 47 bpm - 190 bpm.  Average heart rate 70 bpm.  Low burden of PACs and PVCs.  3 short runs of SVT/PAT with aberrancy versus V. tach.  25 bursts of PAT 3 episodes of what appear to be atrial tach versus atrial fib with a rate of 190 bpm   . COLONOSCOPY WITH PROPOFOL N/A 03/09/2019   Procedure: COLONOSCOPY WITH PROPOFOL;  Surgeon: Wilford Corner, MD;  Location: [facility];  Service: Endoscopy;  Laterality: N/A;  . ESOPHAGOGASTRODUODENOSCOPY (EGD) WITH PROPOFOL N/A 03/09/2019   Procedure: ESOPHAGOGASTRODUODENOSCOPY  (EGD) WITH PROPOFOL;  Surgeon: Wilford Corner, MD;  Location: [facility];  Service: Endoscopy;  Laterality: N/A;  . FRACTURE SURGERY    . KNEE ARTHROSCOPY  09/20/2011   Procedure: ARTHROSCOPY KNEE;  Surgeon: Gearlean Alf, MD;  Location: [facility];  Service: Orthopedics;  Laterality: Right;  Medial meniscal DEBRIDEMENT with chondroplasty, right knee  . KNEE ARTHROSCOPY Left 04/07/2013   Procedure: LEFT KNEE ARTHROSCOPY WITH MEDIAL MENISCUS  DEBRIDEMENT ;  Surgeon: Gearlean Alf, MD;  Location: [facility];  Service: Orthopedics;  Laterality: Left;  . LIPOMA EXCISION Right 2004   neck  . NASAL SEPTUM SURGERY  1992  . ORIF ANKLE FRACTURE Left 1981  . TONSILLECTOMY  2008  . TRANSTHORACIC ECHOCARDIOGRAM  09/2015   EF 60-65%. Gr 1 DD. Mod LA dilation.    Marland Kitchen UVULOPALATOPHARYNGOPLASTY  2008     MEDICATIONS/ALLERGIES   Current Meds  Medication Sig  . aspirin EC 81 MG EC tablet Take 1 tablet (81 mg total) by mouth daily.  Marland Kitchen azelastine (ASTELIN) 0.1 % nasal spray 1 spray each nostril 1-2 times daily as needed  . flecainide (TAMBOCOR) 100 MG tablet TAKE 200 MG IF FOR  RAPID HEART RATE AS DIRECTED  .  gabapentin (NEURONTIN) 300 MG capsule Take 300-600 mg by mouth at bedtime.  Marland Kitchen LORazepam (ATIVAN) 1 MG tablet Take 1 mg by mouth every 8 (eight) hours as needed for anxiety.  Marland Kitchen losartan (COZAAR) 100 MG tablet Take 100 mg daily by mouth.  . metoprolol succinate (TOPROL-XL) 25 MG 24 hr tablet TAKE 1/2 TABLET BY MOUTH DAILY. OKAY TO USE 1/2 TABLET AS NEEDED FOR TACHYCARDIA/FAST HEART RATE  . metoprolol tartrate (LOPRESSOR) 25 MG tablet TAKE 25 MG TABLET ALONG WITH 200 MG FLECAINIDE  IF FOR  RAPID HEART RATE AS DIRECTED  . Multiple Vitamin (MULTIVITAMIN WITH MINERALS) TABS tablet Take 1 tablet by mouth daily.  . ondansetron (ZOFRAN-ODT) 4 MG disintegrating tablet Take 4 mg by mouth 3 (three) times daily as needed.  Marland Kitchen oxymetazoline (AFRIN) 0.05 % nasal spray Place 1 spray into both nostrils  at bedtime. Needed for CPAP  . pantoprazole (PROTONIX) 40 MG tablet Take 1 tablet (40 mg total) by mouth daily. (Patient taking differently: Take 40 mg by mouth 2 (two) times daily before a meal. )  . Probiotic Product (ALIGN) 4 MG CAPS Take 4 mg by mouth 2 (two) times daily.  . sertraline (ZOLOFT) 50 MG tablet Take 50 mg by mouth daily.  . TESTIM 50 MG/5GM (1%) GEL Apply 5 g topically every other day.   . traZODone (DESYREL) 50 MG tablet Take 50 mg by mouth at bedtime as needed.    Allergies  Allergen Reactions  . Oxycodone Nausea Only     SOCIAL HISTORY/FAMILY HISTORY   Social History   Tobacco Use  . Smoking status: Never Smoker  . Smokeless tobacco: Former Systems developer    Types: Secondary school teacher  . Vaping Use: Never used  Substance Use Topics  . Alcohol use: Yes    Comment: 09/28/2014 "couple beers/month"  . Drug use: Yes    Types: Marijuana    Comment: "quit in the 1990s"   Social History   Social History Narrative  . Not on file    Family History family history includes Dementia in his mother; Hypertension in his mother.   OBJCTIVE -PE, EKG, labs   Wt Readings from Last 3 Encounters:  01/13/20 267 lb (121.1 kg)  11/22/19 268 lb 12.8 oz (121.9 kg)  06/15/19 257 lb (116.6 kg)    Physical Exam: BP 132/80   Pulse 62   Temp 97.6 F (36.4 C)   Ht 5\' 7"  (1.702 m)   Wt 267 lb (121.1 kg)   SpO2 97%   BMI 41.82 kg/m  Physical Exam Vitals reviewed.  Constitutional:      General: He is not in acute distress.    Appearance: He is well-developed.     Comments: Morbidly obese.  Well-groomed.  Otherwise healthy-appearing  HENT:     Head: Normocephalic and atraumatic.  Neck:     Vascular: No carotid bruit, hepatojugular reflux or JVD.     Trachea: No tracheal deviation.  Cardiovascular:     Rate and Rhythm: Normal rate and regular rhythm.  No extrasystoles are present.    Chest Wall: PMI is not displaced (Difficult to palpate).     Pulses: Normal pulses and intact  distal pulses.     Heart sounds: S1 normal and S2 normal. Heart sounds are distant. No murmur heard.  No friction rub. No gallop.   Pulmonary:     Effort: Pulmonary effort is normal. No respiratory distress.     Breath sounds: Normal breath sounds. No wheezing  or rales.  Abdominal:     Comments: Obese.  Unable assess HSM  Musculoskeletal:        General: Swelling (Trivial) present. Normal range of motion.     Cervical back: Normal range of motion and neck supple.  Neurological:     General: No focal deficit present.     Mental Status: He is alert and oriented to person, place, and time.  Psychiatric:        Mood and Affect: Mood normal.        Behavior: Behavior normal.        Thought Content: Thought content normal.        Judgment: Judgment normal.      Adult ECG Report  Rate: 62;  Rhythm: normal sinus rhythm and LVH with repolarization.  Significant T wave inversions, unchanged; copy provided  Narrative Interpretation: Stable EKG  Recent Labs: November 10, 2019-TC 187, TG 94, HDL 40, LDL 128 (PCP had stopped his Lipitor possibly because of the back discomfort, and now lipids have notably worsened.)   ASSESSMENT/PLAN    Problem List Items Addressed This Visit    Essential hypertension (Chronic)    Blood pressure looks well controlled today on current medications. No change for now.  Doing well with spironolactone and increased dose of Toprol.      Relevant Medications   metoprolol succinate (TOPROL-XL) 25 MG 24 hr tablet   atorvastatin (LIPITOR) 40 MG tablet   Abnormal finding on EKG - anterolateral biphasic T waves with ST depression (Chronic)    He does have a grossly abnormal EKG but has had nonischemic evaluation with cardiac catheterization as well as a normal echocardiogram.  We gave him a copy to keep.      PAT (paroxysmal atrial tachycardia) (HCC) - Primary (Chronic)    Better controlled with Toprol increased to 50 mg plus PRN Flecainide... However he still  does have short spells.  Her to avoid taking standing flecainide.  We will therefore continue as needed dosing of flecainide plus additional Lopressor.  We also discussed vagal maneuvers.      Relevant Medications   metoprolol succinate (TOPROL-XL) 25 MG 24 hr tablet   atorvastatin (LIPITOR) 40 MG tablet   Other Relevant Orders   EKG 12-Lead (Completed)   Hepatic function panel   Lipid panel   Hyperlipidemia due to dietary fat intake (Chronic)    Notably worsening lipids having stopped Lipitor.  I recommend he goes back on Lipitor 20 mg -> (to take co-Q10 to help with any side effects). We will recheck lipids and LFTs in October      Relevant Medications   metoprolol succinate (TOPROL-XL) 25 MG 24 hr tablet   atorvastatin (LIPITOR) 40 MG tablet   Other Relevant Orders   Hepatic function panel   Lipid panel   DOE (dyspnea on exertion) (Chronic)    This seems to be less of an issue.  Now that he is recovering from his Covid infection, seems to be doing overall better. We will continue to monitor. ->  Can consider ischemic evaluation if symptoms do not get better.      Relevant Orders   EKG 12-Lead (Completed)   Lower extremity edema    Most likely venous isufficiency          COVID-19 Education: The signs and symptoms of COVID-19 were discussed with the patient and how to seek care for testing (follow up with PCP or arrange E-visit).   The importance of social  distancing was discussed today.  I spent a total of 22 minutes with the patient and chart review. >  50% of the time was spent in direct patient consultation.   Additional time spent with chart review (studies, outside notes, etc): 8 Total Time: 30 min   Current medicines are reviewed at length with the patient today.  (+/- concerns) n/a   Patient Instructions / Medication Changes & Studies & Tests Ordered   Patient Instructions  Medication Instructions:  Restart  Atorvastatin 40 mg  Daily  *If you need a  refill on your cardiac medications before your next appointment, please call your pharmacy*   Lab Work: Lipid  Hepatic - oct 2021  If you have labs (blood work) drawn today and your tests are completely normal, you will receive your results only by: Marland Kitchen MyChart Message (if you have MyChart) OR . A paper copy in the mail If you have any lab test that is abnormal or we need to change your treatment, we will call you to review the results.   Testing/Procedures: Not needed   Follow-Up: At [facility], you and your health needs are our priority.  As part of our continuing mission to provide you with exceptional heart care, we have created designated Provider Care Teams.  These Care Teams include your primary Cardiologist (physician) and Advanced Practice Providers (APPs -  Physician Assistants and Nurse Practitioners) who all work together to provide you with the care you need, when you need it.  We recommend signing up for the patient portal called "MyChart".  Sign up information is provided on this After Visit Summary.  MyChart is used to connect with patients for Virtual Visits (Telemedicine).  Patients are able to view lab/test results, encounter notes, upcoming appointments, etc.  Non-urgent messages can be sent to your provider as well.   To learn more about what you can do with MyChart, go to [email or web address].    Your next appointment:   4 month(s)  The format for your next appointment:   In Person  Provider:   Glenetta Hew, MD   Other Instructions     Studies Ordered:   Orders Placed This Encounter  Procedures  . Hepatic function panel  . Lipid panel  . EKG 12-Lead     Glenetta Hew, M.D., M.S. Interventional Cardiologist   Pager # [phone] Phone # [phone] [place], [place] [address]   Thank you for choosing Heartcare at [facility]!!

## 2020-01-18 NOTE — Assessment & Plan Note (Signed)
Blood pressure looks well controlled today on current medications. No change for now.  Doing well with spironolactone and increased dose of Toprol.

## 2020-01-18 NOTE — Assessment & Plan Note (Addendum)
Better controlled with Toprol increased to 50 mg plus PRN Flecainide... However he still does have short spells.  Her to avoid taking standing flecainide.  We will therefore continue as needed dosing of flecainide plus additional Lopressor.  We also discussed vagal maneuvers.

## 2020-01-20 ENCOUNTER — Encounter: Payer: Self-pay | Admitting: Cardiology

## 2020-01-20 NOTE — Assessment & Plan Note (Signed)
Notably worsening lipids having stopped Lipitor.  I recommend he goes back on Lipitor 20 mg -> (to take co-Q10 to help with any side effects). We will recheck lipids and LFTs in October

## 2020-01-20 NOTE — Assessment & Plan Note (Signed)
He does have a grossly abnormal EKG but has had nonischemic evaluation with cardiac catheterization as well as a normal echocardiogram.  We gave him a copy to keep.

## 2020-01-20 NOTE — Assessment & Plan Note (Signed)
This seems to be less of an issue.  Now that he is recovering from his Covid infection, seems to be doing overall better. We will continue to monitor. ->  Can consider ischemic evaluation if symptoms do not get better.

## 2020-03-25 ENCOUNTER — Other Ambulatory Visit: Payer: Self-pay

## 2020-03-25 ENCOUNTER — Emergency Department (HOSPITAL_BASED_OUTPATIENT_CLINIC_OR_DEPARTMENT_OTHER)
Admission: EM | Admit: 2020-03-25 | Discharge: 2020-03-25 | Disposition: A | Discharge status: Home / Self Care | Payer: 59 | Attending: Emergency Medicine | Admitting: Emergency Medicine

## 2020-03-25 ENCOUNTER — Encounter (HOSPITAL_BASED_OUTPATIENT_CLINIC_OR_DEPARTMENT_OTHER): Payer: Self-pay | Admitting: *Deleted

## 2020-03-25 ENCOUNTER — Emergency Department (HOSPITAL_BASED_OUTPATIENT_CLINIC_OR_DEPARTMENT_OTHER): Payer: 59

## 2020-03-25 DIAGNOSIS — Z79899 Other long term (current) drug therapy: Secondary | ICD-10-CM | POA: Insufficient documentation

## 2020-03-25 DIAGNOSIS — R002 Palpitations: Secondary | ICD-10-CM | POA: Insufficient documentation

## 2020-03-25 DIAGNOSIS — Z7982 Long term (current) use of aspirin: Secondary | ICD-10-CM | POA: Insufficient documentation

## 2020-03-25 DIAGNOSIS — Z87891 Personal history of nicotine dependence: Secondary | ICD-10-CM | POA: Diagnosis not present

## 2020-03-25 DIAGNOSIS — I10 Essential (primary) hypertension: Secondary | ICD-10-CM | POA: Diagnosis not present

## 2020-03-25 LAB — CBC
HCT: 43.2 % (ref 39.0–52.0)
Hemoglobin: 13.9 g/dL (ref 13.0–17.0)
MCH: 28.5 pg (ref 26.0–34.0)
MCHC: 32.2 g/dL (ref 30.0–36.0)
MCV: 88.7 fL (ref 80.0–100.0)
Platelets: 238 10*3/uL (ref 150–400)
RBC: 4.87 MIL/uL (ref 4.22–5.81)
RDW: 12.9 % (ref 11.5–15.5)
WBC: 8 10*3/uL (ref 4.0–10.5)
nRBC: 0 % (ref 0.0–0.2)

## 2020-03-25 LAB — BASIC METABOLIC PANEL
Anion gap: 9 (ref 5–15)
BUN: 13 mg/dL (ref 6–20)
CO2: 29 mmol/L (ref 22–32)
Calcium: 8.7 mg/dL — ABNORMAL LOW (ref 8.9–10.3)
Chloride: 102 mmol/L (ref 98–111)
Creatinine, Ser: 1.01 mg/dL (ref 0.61–1.24)
GFR calc Af Amer: >60 mL/min (ref >60)
GFR calc non Af Amer: >60 mL/min (ref >60)
Glucose, Bld: 174 mg/dL — ABNORMAL HIGH (ref 70–99)
Potassium: 3.3 mmol/L — ABNORMAL LOW (ref 3.5–5.1)
Sodium: 140 mmol/L (ref 135–145)

## 2020-03-25 NOTE — ED Notes (Signed)
UA sample in lab

## 2020-03-25 NOTE — ED Provider Notes (Signed)
[facility] EMERGENCY DEPARTMENT Provider Note   CSN: [id number] Arrival date & time: 03/25/20  1428     History Chief Complaint  Patient presents with  . Palpitations    Joshua Stanton is a 59 y.o. male hx of paroxysmal atrial tachycardia, chronic back pain here presenting with palpitations.  Patient states that he gets tachycardia episodes and has been taking metoprolol and flecainide as needed.  He states that he was at the store today and had sudden onset of palpitations. He then took his medicines prior to arrival and now the palpitations is resolved.  Patient denies any chest pain or shortness of breath.  Patient has ongoing back pain for last several years.  He had extensive work-up including MRI and GI studies.  Patient states that the back pain has not been getting worse. Patient denies any trouble walking or arm weakness or numbness.  The history is provided by the patient.       Past Medical History:  Diagnosis Date  . Arthritis    "lower back" (09/28/2015)  . Atypical angina ([place]) dx'd 09/2015  . GERD (gastroesophageal reflux disease)   . High cholesterol   . History of hiatal hernia   . Hypertension   . OSA on CPAP   . Palpitation    last occurrence saturday 03-06-2019 , per patient , ame and went quickly, took his metoprolol at first sign of it . no reoccurence since that time   . Prediabetes   . Seasonal allergies     Patient Active Problem List   Diagnosis Date Noted  . Hyperlipidemia due to dietary fat intake 01/13/2020  . DOE (dyspnea on exertion) 01/13/2020  . Lower extremity edema 01/13/2020  . Intolerance, food 11/22/2019  . Nonallergic rhinitis 11/22/2019  . Abdominal pain, epigastric 03/09/2019  . Esophageal reflux 03/09/2019  . Nausea 03/09/2019  . PAT (paroxysmal atrial tachycardia) ([place]) 11/30/2018  . Essential hypertension 09/21/2015  . Obstructive sleep apnea 09/21/2015  . Abnormal finding on EKG - anterolateral biphasic T  waves with ST depression 09/21/2015  . Acute medial meniscal tear 04/06/2013    Past Surgical History:  Procedure Laterality Date  . ANKLE HARDWARE REMOVAL Left 2006   left   . BIOPSY  03/09/2019   Procedure: BIOPSY;  Surgeon: Wilford Corner, MD;  Location: [facility];  Service: Endoscopy;;  . CARDIAC CATHETERIZATION N/A 09/28/2015   Procedure: Left Heart Cath and Coronary Angiography;  Surgeon: Wellington Hampshire, MD;  Location: [facility];  Service: Cardiovascular;: For possible atypical angina, mildly elevated troponins.  Angiographically normal coronary arteries  . CARDIAC EVENT MONITOR  09/2018   Heart rate range 47 bpm - 190 bpm.  Average heart rate 70 bpm.  Low burden of PACs and PVCs.  3 short runs of SVT/PAT with aberrancy versus V. tach.  25 bursts of PAT 3 episodes of what appear to be atrial tach versus atrial fib with a rate of 190 bpm   . COLONOSCOPY WITH PROPOFOL N/A 03/09/2019   Procedure: COLONOSCOPY WITH PROPOFOL;  Surgeon: Wilford Corner, MD;  Location: [facility];  Service: Endoscopy;  Laterality: N/A;  . ESOPHAGOGASTRODUODENOSCOPY (EGD) WITH PROPOFOL N/A 03/09/2019   Procedure: ESOPHAGOGASTRODUODENOSCOPY (EGD) WITH PROPOFOL;  Surgeon: Wilford Corner, MD;  Location: [facility];  Service: Endoscopy;  Laterality: N/A;  . FRACTURE SURGERY    . KNEE ARTHROSCOPY  09/20/2011   Procedure: ARTHROSCOPY KNEE;  Surgeon: Gearlean Alf, MD;  Location[facility];  Service:  Orthopedics;  Laterality: Right;  Medial meniscal DEBRIDEMENT with chondroplasty, right knee  . KNEE ARTHROSCOPY Left 04/07/2013   Procedure: LEFT KNEE ARTHROSCOPY WITH MEDIAL MENISCUS  DEBRIDEMENT ;  Surgeon: Gearlean Alf, MD;  Location: [facility];  Service: Orthopedics;  Laterality: Left;  . LIPOMA EXCISION Right 2004   neck  . NASAL SEPTUM SURGERY  1992  . ORIF ANKLE FRACTURE Left 1981  . TONSILLECTOMY  2008  . TRANSTHORACIC ECHOCARDIOGRAM  09/2015   EF 60-65%. Gr 1 DD. Mod LA  dilation.    Marland Kitchen UVULOPALATOPHARYNGOPLASTY  2008       Family History  Problem Relation Age of Onset  . Hypertension Mother   . Dementia Mother     Social History   Tobacco Use  . Smoking status: Never Smoker  . Smokeless tobacco: Former Systems developer    Types: Secondary school teacher  . Vaping Use: Never used  Substance Use Topics  . Alcohol use: Yes    Comment: 09/28/2014 "couple beers/month"  . Drug use: Yes    Types: Marijuana    Comment: "quit in the 1990s"    Home Medications Prior to Admission medications   Medication Sig Start Date End Date Taking? Authorizing Provider  aspirin EC 81 MG EC tablet Take 1 tablet (81 mg total) by mouth daily. 09/22/15  Yes Ghimire, Henreitta Leber, MD  atorvastatin (LIPITOR) 40 MG tablet Take 1 tablet (40 mg total) by mouth daily. 01/13/20 04/12/20 Yes Leonie Man, MD  flecainide (TAMBOCOR) 100 MG tablet TAKE 200 MG IF FOR  RAPID HEART RATE AS DIRECTED 06/15/19  Yes Leonie Man, MD  gabapentin (NEURONTIN) 300 MG capsule Take 300-600 mg by mouth at bedtime. 12/22/19  Yes [provider]  LORazepam (ATIVAN) 1 MG tablet Take 1 mg by mouth every 8 (eight) hours as needed for anxiety.   Yes [provider]  losartan (COZAAR) 100 MG tablet Take 100 mg daily by mouth.   Yes [provider]  metoprolol tartrate (LOPRESSOR) 25 MG tablet TAKE 25 MG TABLET ALONG WITH 200 MG FLECAINIDE  IF FOR  RAPID HEART RATE AS DIRECTED 06/15/19  Yes Leonie Man, MD  Multiple Vitamin (MULTIVITAMIN WITH MINERALS) TABS tablet Take 1 tablet by mouth daily.   Yes [provider]  ondansetron (ZOFRAN-ODT) 4 MG disintegrating tablet Take 4 mg by mouth 3 (three) times daily as needed. 03/17/19  Yes [provider]  pantoprazole (PROTONIX) 40 MG tablet Take 1 tablet (40 mg total) by mouth daily. Patient taking differently: Take 40 mg by mouth 2 (two) times daily before a meal.  10/01/16  Yes Simmons, Brittainy M, PA-C  TESTIM 50 MG/5GM (1%) GEL  Apply 5 g topically every other day.  08/30/15  Yes [provider]  traZODone (DESYREL) 50 MG tablet Take 50 mg by mouth at bedtime as needed. 03/23/19  Yes [provider]  azelastine (ASTELIN) 0.1 % nasal spray 1 spray each nostril 1-2 times daily as needed 11/22/19   Bobbitt, Sedalia Muta, MD  metoprolol succinate (TOPROL-XL) 25 MG 24 hr tablet TAKE 1/2 TABLET BY MOUTH DAILY. OKAY TO USE 1/2 TABLET AS NEEDED FOR TACHYCARDIA/FAST HEART RATE 11/19/19   [provider]  oxymetazoline (AFRIN) 0.05 % nasal spray Place 1 spray into both nostrils at bedtime. Needed for CPAP    [provider]  Probiotic Product (ALIGN) 4 MG CAPS Take 4 mg by mouth 2 (two) times daily.    [provider]  sertraline (ZOLOFT) 50 MG tablet Take 50 mg by mouth daily. 06/11/19   [provider]  spironolactone (ALDACTONE) 25 MG tablet Take 0.5 tablets (12.5 mg total) by mouth daily. 06/15/19 09/13/19  Leonie Man, MD    Allergies    Oxycodone  Review of Systems   Review of Systems  Cardiovascular: Positive for palpitations.  All other systems reviewed and are negative.   Physical Exam Updated Vital Signs BP (!) 160/99 (BP Location: Right Arm)   Pulse 68   Temp 98.5 F (36.9 C) (Oral)   Resp 14   Ht 5\' 7"  (1.702 m)   Wt 120.2 kg   SpO2 99%   BMI 41.50 kg/m   Physical Exam Vitals and nursing note reviewed.  Constitutional:      Appearance: Normal appearance.  HENT:     Head: Normocephalic.     Nose: Nose normal.     Mouth/Throat:     Mouth: Mucous membranes are moist.  Eyes:     Extraocular Movements: Extraocular movements intact.     Pupils: Pupils are equal, round, and reactive to light.  Cardiovascular:     Rate and Rhythm: Normal rate and regular rhythm.     Pulses: Normal pulses.     Heart sounds: Normal heart sounds.  Pulmonary:     Effort: Pulmonary effort is normal.     Breath sounds: Normal breath sounds.  Abdominal:     General:  Abdomen is flat.     Palpations: Abdomen is soft.  Musculoskeletal:        General: Normal range of motion.     Cervical back: Normal range of motion.  Skin:    General: Skin is warm.     Capillary Refill: Capillary refill takes less than 2 seconds.  Neurological:     General: No focal deficit present.     Mental Status: He is alert and oriented to person, place, and time.  Psychiatric:        Mood and Affect: Mood normal.        Behavior: Behavior normal.     ED Results / Procedures / Treatments   Labs (all labs ordered are listed, but only abnormal results are displayed) Labs Reviewed  BASIC METABOLIC PANEL - Abnormal; Notable for the following components:      Result Value   Potassium 3.3 (*)    Glucose, Bld 174 (*)    Calcium 8.7 (*)    All other components within normal limits  CBC    EKG EKG Interpretation  Date/Time:  Saturday March 25 2020 14:49:48 EDT Ventricular Rate:  68 PR Interval:  212 QRS Duration: 112 QT Interval:  436 QTC Calculation: 463 R Axis:   8 Text Interpretation: Sinus rhythm with 1st degree A-V block Left ventricular hypertrophy with repolarization abnormality ( R in aVL ) Abnormal ECG TWI inferior leads new  since previous Confirmed by Wandra Arthurs [phone]) on 03/25/2020 4:43:55 PM   Radiology DG Chest 2 View  Result Date: 03/25/2020 CLINICAL DATA:  Cardiac palpitations and shortness of breath EXAM: CHEST - 2 VIEW COMPARISON:  May 14, 2017 FINDINGS: Lungs are clear. Heart is borderline enlarged with pulmonary vascularity normal. No adenopathy. There is mild degenerative change in the thoracic spine. IMPRESSION: Lungs clear. Borderline cardiac enlargement. No evident adenopathy. Electronically Signed   By: Lowella Grip III M.D.   On: 03/25/2020 15:27    Procedures Procedures (including critical care time)  Medications Ordered in ED Medications -  No data to display  ED Course  I have reviewed the triage vital signs and the  nursing notes.  Pertinent labs & imaging results that were available during my care of the patient were reviewed by me and considered in my medical decision making (see chart for details).    MDM Rules/Calculators/A&P                         Joshua Stanton is a 59 y.o. male here with palpitations.  Patient has a history of atrial tachycardia. Patient has been followed with Dr. [name].  Patient's EKG showed bundle branch block that is baseline.  He has some nonspecific changes.  Patient's heart rate is in the 60s in the ED.  Patient had no chest pain with this episode.  I wonder if he went briefly into a atrial tachycardia episode.  Patient has seen EP in the past.  His labs and chest x-ray were unremarkable.  I think patient can be discharged and continue metoprolol and flecainide as needed and follow-up with cardiology.  Patient does have chronic back pain that has been fully evaluated by spine doctor as well as multiple specialist.   Final Clinical Impression(s) / ED Diagnoses Final diagnoses:  None    Rx / DC Orders ED Discharge Orders    None       Drenda Freeze, MD 03/25/20 1722

## 2020-03-25 NOTE — ED Triage Notes (Signed)
Pt reports he has hx of "tachycardia episodes" which which he takes flecainide and metoprolol as needed. States today he was at the store and began having palpitations and felt dizzy. He took a flecainide and came to ED. HR 78 upon arrival. Denies chest pain. Reports back pain "for months"

## 2020-03-25 NOTE — Discharge Instructions (Addendum)
You likely went into atrial tachycardia  You should take metoprolol and flecainide if you feel your heart is racing.  See your cardiologist in follow-up in a week.  Return to ER if you have worse palpitations, chest pain, shortness of breath.

## 2020-04-27 ENCOUNTER — Other Ambulatory Visit: Payer: Self-pay

## 2020-04-27 ENCOUNTER — Encounter: Payer: Self-pay | Admitting: Cardiology

## 2020-04-27 ENCOUNTER — Ambulatory Visit: Payer: 59 | Admitting: Cardiology

## 2020-04-27 VITALS — BP 142/90 | HR 60 | Ht 67.0 in | Wt 266.6 lb

## 2020-04-27 DIAGNOSIS — R0609 Other forms of dyspnea: Secondary | ICD-10-CM

## 2020-04-27 DIAGNOSIS — I471 Supraventricular tachycardia: Secondary | ICD-10-CM

## 2020-04-27 DIAGNOSIS — R0600 Dyspnea, unspecified: Secondary | ICD-10-CM

## 2020-04-27 DIAGNOSIS — I10 Essential (primary) hypertension: Secondary | ICD-10-CM

## 2020-04-27 DIAGNOSIS — I4719 Other supraventricular tachycardia: Secondary | ICD-10-CM

## 2020-04-27 DIAGNOSIS — G4733 Obstructive sleep apnea (adult) (pediatric): Secondary | ICD-10-CM

## 2020-04-27 DIAGNOSIS — R9431 Abnormal electrocardiogram [ECG] [EKG]: Secondary | ICD-10-CM | POA: Diagnosis not present

## 2020-04-27 DIAGNOSIS — E7849 Other hyperlipidemia: Secondary | ICD-10-CM | POA: Diagnosis not present

## 2020-04-27 NOTE — Patient Instructions (Signed)
Medication Instructions:  No changes *If you need a refill on your cardiac medications before your next appointment, please call your pharmacy*   Lab Work: Not needed   Testing/Procedures: Not needed   Follow-Up: At [facility], you and your health needs are our priority.  As part of our continuing mission to provide you with exceptional heart care, we have created designated Provider Care Teams.  These Care Teams include your primary Cardiologist (physician) and Advanced Practice Providers (APPs -  Physician Assistants and Nurse Practitioners) who all work together to provide you with the care you need, when you need it.  Your next appointment:   6 month(s)  The format for your next appointment:   In Person  Provider:   Glenetta Hew, MD   Other Instructions Become more active - it will help you not to have more episodes Recommendations for vagal maneuvers when you rapid heart rate:  "Bearing down"  Coughing  Gagging  Icy, cold towel on face or drink ice cold water

## 2020-04-27 NOTE — Progress Notes (Signed)
Primary Care Provider: [name], MD Cardiologist: Glenetta Hew, MD Electrophysiologist: Thompson Grayer, MD  Clinic Note: Chief Complaint  Patient presents with  . Hospitalization Follow-up    Had a ER visit on September 1  . Tachycardia    Still having some fast heart rate spells.  Often triggered by back pain  . Hypertension    Says his pressures are in the 120s/70s at home.    HPI:    Joshua Stanton is a 59 y.o. male with a PMH notable for OSA/CPAP, PAT/(possible flutter), PVCs as well as hypertension who presents here for 6-28-month follow-up.Marland Kitchen  He was seen by Dr. Rayann Heman from EP for his arrhythmia issues--recommendation was to try to avoid antiarrhythmic unless symptoms worsen, in that case, could consider as needed flecainide.  I then saw him on December 1st 2020 for earlier than planned follow-up due to chest discomfort and palpitations frequent, these are relieved with lorazepam.  He also noted elevated blood pressures.  He indicated that off-and-on chest discomfort was noted with tachycardia spells associated elevated heart rates to the 170s 180s.  This is associated with dizziness and dyspnea, however now syncope or near syncope. => Monitor showed 3 short runs of "VT are extra to the emergency on 25 short runs of PSVT or PAT up to 16 beats.--Episodes not thought to be mild to be true A. Fib. ==> Plan was long-acting Toprol (originally 50 mg) for baseline management and then breakthrough spells 25 mg metoprolol tartrate (Lopressor) plus flecainide 200 mg for prolonged episodes.  - Had bout of COVID in June 2020(he, his wife, dtr & mother-in-law-- mother-in-law died, was on HD) -> symptoms picked up after this.  Joshua Stanton was last seen on January 13, 2020 -> still noted to having 1 or 2 episodes of fast heart rate spells with heart rates in the 180s.  Broke with flecainide plus Lopressor.  He did get near syncope but no full syncope.  Longest episode lasted  about 20 to 30 minutes.  Maybe 2 or 3 episodes a month. Denies any exercise intolerance. .-> We restarted atorvastatin.  Recent Hospitalizations:   March 25, 2020 after an episode of palpitations that broke with beta-blocker and flecainide.  He denied any chest pain or dyspnea, but did note ongoing back pain issues.  Reviewed  CV studies:    The following studies were reviewed today: (if available, images/films reviewed: From Epic Chart or Care Everywhere) . None:   Interval History:   Joshua Stanton returns here today noting that he still has episodes of fast heart rate but it is often associated with his back pain and that when the pain happens, his heart rate goes up for short periods.  On a recent couple occasions he is taking his Lopressor but cannot, and the symptoms improved.  He did not indicate having episode of tachycardia in the absence of pain.  He has been dealing with GERD issues in addition to the back pain.Marland Kitchen  He has been having difficulty that is working with sleep medicine to try to change out either his mask or his settings.  He says that they may be blowing too hard in his face.  Off-and-on edema of the pretty well controlled.  He acknowledges that he is quite deconditioned and may get short of breath with doing more than routine activities.  No chest pain or pressure.  He does however note that he was not able to tolerate 50 mg of Toprol  therefore we will reduce down to 25 mg.  He indicated that he has less fatigue with the lower dose.  His blood pressure is high today, but he says it is usually in the 120/70 mmHg at home.  CV Review of Symptoms (Summary) Cardiovascular ROS: positive for - dyspnea on exertion, irregular heartbeat, palpitations, rapid heart rate and Has not had prolonged tachycardia spells lately chest pain.  Usually these are related to back pain.  At least 2 times treated with beta-blocker plus flecainide negative for - chest pain, loss of  consciousness, orthopnea, paroxysmal nocturnal dyspnea, shortness of breath or TIA/amaurosis fugax; syncope/near syncope; claudication;   The patient does not have symptoms concerning for COVID-19 infection (fever, chills, cough, or new shortness of breath).  He is practicing social distancing => after he recovered from his Covid infection, he has now had both of his vaccines and is waiting to schedule his booster.  REVIEWED OF SYSTEMS   A comprehensive ROS was performed. Review of Systems  Constitutional: Positive for malaise/fatigue (Right now he indicates it is more because he is not getting good sleep-having issues with CPAP.). Negative for weight loss.  HENT: Negative for congestion and nosebleeds.   Respiratory: Positive for shortness of breath (Per HPI). Negative for cough and wheezing.   Cardiovascular: Positive for palpitations (Per HPI) and leg swelling (Trivial/stable).  Gastrointestinal: Negative for blood in stool, heartburn and melena.  Genitourinary: Negative for hematuria.  Musculoskeletal: Positive for back pain (He has had lots of back pain off and on and thinks that may be triggering some of his heart rate spells.  He has been taking gabapentin which does seem to have improved.) and joint pain (Significant right knee pain from osteoarthritis.).  Neurological: Positive for headaches. Negative for dizziness, focal weakness and weakness.  Psychiatric/Behavioral: Negative for depression and memory loss. The patient is nervous/anxious (Associated with rapid heart rates). The patient does not have insomnia.   All other systems reviewed and are negative.  I have reviewed and (if needed) personally updated the patient's problem list, medications, allergies, past medical and surgical history, social and family history.   PAST MEDICAL HISTORY   Past Medical History:  Diagnosis Date  . Arthritis    "lower back" (09/28/2015)  . Atypical angina ([place]) dx'd 09/2015  . GERD  (gastroesophageal reflux disease)   . High cholesterol   . History of hiatal hernia   . Hypertension   . OSA on CPAP   . Palpitation    last occurrence saturday 03-06-2019 , per patient , ame and went quickly, took his metoprolol at first sign of it . no reoccurence since that time   . Prediabetes   . Seasonal allergies     PAST SURGICAL HISTORY   Past Surgical History:  Procedure Laterality Date  . ANKLE HARDWARE REMOVAL Left 2006   left   . BIOPSY  03/09/2019   Procedure: BIOPSY;  Surgeon: Wilford Corner, MD;  Location: [facility];  Service: Endoscopy;;  . CARDIAC CATHETERIZATION N/A 09/28/2015   Procedure: Left Heart Cath and Coronary Angiography;  Surgeon: Wellington Hampshire, MD;  Location: [facility];  Service: Cardiovascular;: For possible atypical angina, mildly elevated troponins.  Angiographically normal coronary arteries  . CARDIAC EVENT MONITOR  09/2018   Heart rate range 47 bpm - 190 bpm.  Average heart rate 70 bpm.  Low burden of PACs and PVCs.  3 short runs of SVT/PAT with aberrancy versus V. tach.  25 bursts of PAT  3 episodes of what appear to be atrial tach versus atrial fib with a rate of 190 bpm   . COLONOSCOPY WITH PROPOFOL N/A 03/09/2019   Procedure: COLONOSCOPY WITH PROPOFOL;  Surgeon: Wilford Corner, MD;  Location: [facility];  Service: Endoscopy;  Laterality: N/A;  . ESOPHAGOGASTRODUODENOSCOPY (EGD) WITH PROPOFOL N/A 03/09/2019   Procedure: ESOPHAGOGASTRODUODENOSCOPY (EGD) WITH PROPOFOL;  Surgeon: Wilford Corner, MD;  Location: [facility];  Service: Endoscopy;  Laterality: N/A;  . FRACTURE SURGERY    . KNEE ARTHROSCOPY  09/20/2011   Procedure: ARTHROSCOPY KNEE;  Surgeon: Gearlean Alf, MD;  Location: [facility];  Service: Orthopedics;  Laterality: Right;  Medial meniscal DEBRIDEMENT with chondroplasty, right knee  . KNEE ARTHROSCOPY Left 04/07/2013   Procedure: LEFT KNEE ARTHROSCOPY WITH MEDIAL MENISCUS  DEBRIDEMENT ;  Surgeon:  Gearlean Alf, MD;  Location: [facility];  Service: Orthopedics;  Laterality: Left;  . LIPOMA EXCISION Right 2004   neck  . NASAL SEPTUM SURGERY  1992  . ORIF ANKLE FRACTURE Left 1981  . TONSILLECTOMY  2008  . TRANSTHORACIC ECHOCARDIOGRAM  09/2015   EF 60-65%. Gr 1 DD. Mod LA dilation.    Marland Kitchen UVULOPALATOPHARYNGOPLASTY  2008     MEDICATIONS/ALLERGIES   Current Meds  Medication Sig  . aspirin EC 81 MG EC tablet Take 1 tablet (81 mg total) by mouth daily.  . flecainide (TAMBOCOR) 100 MG tablet TAKE 200 MG IF FOR  RAPID HEART RATE AS DIRECTED  . gabapentin (NEURONTIN) 300 MG capsule Take 300-600 mg by mouth at bedtime.  Marland Kitchen LORazepam (ATIVAN) 1 MG tablet Take 1 mg by mouth every 8 (eight) hours as needed for anxiety.  Marland Kitchen losartan (COZAAR) 100 MG tablet Take 100 mg daily by mouth.  . metoprolol succinate (TOPROL-XL) 25 MG 24 hr tablet TAKE 1/2 TABLET BY MOUTH DAILY. OKAY TO USE 1/2 TABLET AS NEEDED FOR TACHYCARDIA/FAST HEART RATE  . metoprolol tartrate (LOPRESSOR) 25 MG tablet TAKE 25 MG TABLET ALONG WITH 200 MG FLECAINIDE  IF FOR  RAPID HEART RATE AS DIRECTED  . Multiple Vitamin (MULTIVITAMIN WITH MINERALS) TABS tablet Take 1 tablet by mouth daily.  . ondansetron (ZOFRAN-ODT) 4 MG disintegrating tablet Take 4 mg by mouth 3 (three) times daily as needed.  Marland Kitchen oxymetazoline (AFRIN) 0.05 % nasal spray Place 1 spray into both nostrils at bedtime. Needed for CPAP  . pantoprazole (PROTONIX) 40 MG tablet Take 1 tablet (40 mg total) by mouth daily.  . Probiotic Product (ALIGN) 4 MG CAPS Take 4 mg by mouth 2 (two) times daily.  . TESTIM 50 MG/5GM (1%) GEL Apply 5 g topically every other day.   . traZODone (DESYREL) 50 MG tablet Take 50 mg by mouth at bedtime as needed.    Allergies  Allergen Reactions  . Oxycodone Nausea Only     SOCIAL HISTORY/FAMILY HISTORY   Social History   Tobacco Use  . Smoking status: Never Smoker  . Smokeless tobacco: Former Systems developer    Types: Secondary school teacher  . Vaping  Use: Never used  Substance Use Topics  . Alcohol use: Yes    Comment: 09/28/2014 "couple beers/month"  . Drug use: Yes    Types: Marijuana    Comment: "quit in the 1990s"   Social History   Social History Narrative  . Not on file    Family History family history includes Dementia in his mother; Hypertension in his mother.   OBJCTIVE -PE, EKG, labs   Wt Readings from Last  3 Encounters:  04/27/20 266 lb 9.6 oz (120.9 kg)  03/25/20 265 lb (120.2 kg)  01/13/20 267 lb (121.1 kg)    Physical Exam: BP (!) 142/90   Pulse 60   Ht 5\' 7"  (1.702 m)   Wt 266 lb 9.6 oz (120.9 kg)   BMI 41.76 kg/m  Physical Exam Vitals reviewed.  Constitutional:      General: He is not in acute distress.    Appearance: He is well-developed.     Comments: Morbidly obese, well-groomed.  HENT:     Head: Normocephalic and atraumatic.  Neck:     Vascular: No carotid bruit, hepatojugular reflux or JVD.     Trachea: No tracheal deviation.  Cardiovascular:     Rate and Rhythm: Normal rate and regular rhythm.  No extrasystoles are present.    Chest Wall: PMI is not displaced (Difficult to palpate).     Pulses: Normal pulses and intact distal pulses.     Heart sounds: S1 normal and S2 normal. Heart sounds are distant. No murmur heard.  No friction rub. No gallop.   Pulmonary:     Effort: Pulmonary effort is normal. No respiratory distress.     Breath sounds: Normal breath sounds. No wheezing or rales.  Chest:     Chest wall: No tenderness.  Abdominal:     Comments: Obese.  Unable assess HSM  Musculoskeletal:        General: Swelling (Trivial) present. Normal range of motion.     Cervical back: Normal range of motion and neck supple.  Neurological:     General: No focal deficit present.     Mental Status: He is alert and oriented to person, place, and time.  Psychiatric:        Mood and Affect: Mood normal.        Behavior: Behavior normal.        Thought Content: Thought content normal.         Judgment: Judgment normal.      Adult ECG Report  Rate: 60;  Rhythm: normal sinus rhythm and LVH with repolarization.  Significant T wave inversions, unchanged; copy provided  Narrative Interpretation: EKG is stable.  Recent Labs:  Cardiology labs since November 10, 2019-TC 187, TG 94, HDL 40, LDL 128  (PCP had stopped his atorvastatin possibly because of the back discomfort => since he had no change in back pain, we restarted atorvastatin)   ASSESSMENT/PLAN    Problem List Items Addressed This Visit    Essential hypertension - Primary (Chronic)    Blood pressure is high today, he says that he is dealing with back pain and very often when it hurts, his pressures go up.  Usually at home pressures are much better than they are here today.  He did not tolerate 50 mg Toprol.  He actually is now taking maybe half tablet 25 mg in addition to losartan 100 mg.  We will follow his pressures closely and may need to consider adding diuretic however will be judicious in diuretic use.      Abnormal finding on EKG - anterolateral biphasic T waves with ST depression (Chronic)    EKG is quite abnormal, but if this is been evaluated with detailed ischemic evaluation including cardiac catheterization.  He has normal echocardiogram.      Relevant Orders   EKG 12-Lead (Completed)   Obstructive sleep apnea (Chronic)    Needs to get his settings straight.  Is working with sleep medicine.  PAT (paroxysmal atrial tachycardia) (HCC) (Chronic)    Still having breakthrough spells.  Unfortunately not able to tolerate 50 mg of Toprol the first proximal of breakthrough spells.  Seem to be triggered by pain which does make sense.  We talked about vagal maneuvers but also if these do not work he has flecainide plus short acting Lopressor combination to use for breakthrough spells.      Relevant Orders   EKG 12-Lead (Completed)   Hyperlipidemia due to dietary fat intake (Chronic)    We restarted him  back on atorvastatin.  He says his PCP is checking labs in the end of October.      DOE (dyspnea on exertion) (Chronic)    I think this is still related to deconditioning and obesity.          COVID-19 Education: The signs and symptoms of COVID-19 were discussed with the patient and how to seek care for testing (follow up with PCP or arrange E-visit).   The importance of social distancing was discussed today.  I spent a total of 25 minutes with the patient and chart review. >  50% of the time was spent in direct patient consultation.   Additional time spent with chart review (studies, outside notes, etc): 10 Total Time: 35 min   Current medicines are reviewed at length with the patient today.  (+/- concerns) n/a   Patient Instructions / Medication Changes & Studies & Tests Ordered   Patient Instructions  Medication Instructions:  No changes *If you need a refill on your cardiac medications before your next appointment, please call your pharmacy*   Lab Work: Not needed   Testing/Procedures: Not needed   Follow-Up: At [facility], you and your health needs are our priority.  As part of our continuing mission to provide you with exceptional heart care, we have created designated Provider Care Teams.  These Care Teams include your primary Cardiologist (physician) and Advanced Practice Providers (APPs -  Physician Assistants and Nurse Practitioners) who all work together to provide you with the care you need, when you need it.  Your next appointment:   6 month(s)  The format for your next appointment:   In Person  Provider:   Glenetta Hew, MD   Other Instructions Become more active - it will help you not to have more episodes Recommendations for vagal maneuvers when you rapid heart rate:  "Bearing down"  Coughing  Gagging  Icy, cold towel on face or drink ice cold water     Studies Ordered:   Orders Placed This Encounter  Procedures  . EKG 12-Lead      Glenetta Hew, M.D., M.S. Interventional Cardiologist   Pager # [phone] Phone # [phone] [address] [place], [place] [address]   Thank you for choosing [facility] at [facility]!!

## 2020-05-06 ENCOUNTER — Encounter: Payer: Self-pay | Admitting: Cardiology

## 2020-05-06 NOTE — Assessment & Plan Note (Addendum)
Blood pressure is high today, he says that he is dealing with back pain and very often when it hurts, his pressures go up.  Usually at home pressures are much better than they are here today.  He did not tolerate 50 mg Toprol.  He actually is now taking maybe half tablet 25 mg in addition to losartan 100 mg.  We will follow his pressures closely and may need to consider adding diuretic however will be judicious in diuretic use.

## 2020-05-06 NOTE — Assessment & Plan Note (Signed)
Still having breakthrough spells.  Unfortunately not able to tolerate 50 mg of Toprol the first proximal of breakthrough spells.  Seem to be triggered by pain which does make sense.  We talked about vagal maneuvers but also if these do not work he has flecainide plus short acting Lopressor combination to use for breakthrough spells.

## 2020-05-06 NOTE — Assessment & Plan Note (Signed)
We restarted him back on atorvastatin.  He says his PCP is checking labs in the end of October.

## 2020-05-06 NOTE — Assessment & Plan Note (Signed)
Needs to get his settings straight.  Is working with sleep medicine.

## 2020-05-06 NOTE — Assessment & Plan Note (Signed)
EKG is quite abnormal, but if this is been evaluated with detailed ischemic evaluation including cardiac catheterization.  He has normal echocardiogram.

## 2020-05-06 NOTE — Assessment & Plan Note (Signed)
I think this is still related to deconditioning and obesity.

## 2020-05-17 ENCOUNTER — Emergency Department (HOSPITAL_COMMUNITY)
Admission: EM | Admit: 2020-05-17 | Discharge: 2020-05-17 | Disposition: A | Discharge status: Home / Self Care | Payer: 59 | Attending: Emergency Medicine | Admitting: Emergency Medicine

## 2020-05-17 ENCOUNTER — Encounter (HOSPITAL_COMMUNITY): Payer: Self-pay | Admitting: Emergency Medicine

## 2020-05-17 ENCOUNTER — Other Ambulatory Visit: Payer: Self-pay

## 2020-05-17 DIAGNOSIS — F1590 Other stimulant use, unspecified, uncomplicated: Secondary | ICD-10-CM | POA: Diagnosis not present

## 2020-05-17 DIAGNOSIS — Z7982 Long term (current) use of aspirin: Secondary | ICD-10-CM | POA: Diagnosis not present

## 2020-05-17 DIAGNOSIS — N4833 Priapism, drug-induced: Secondary | ICD-10-CM | POA: Insufficient documentation

## 2020-05-17 DIAGNOSIS — I10 Essential (primary) hypertension: Secondary | ICD-10-CM | POA: Diagnosis not present

## 2020-05-17 DIAGNOSIS — Z87891 Personal history of nicotine dependence: Secondary | ICD-10-CM | POA: Diagnosis not present

## 2020-05-17 DIAGNOSIS — N4830 Priapism, unspecified: Secondary | ICD-10-CM | POA: Diagnosis present

## 2020-05-17 DIAGNOSIS — T426X5A Adverse effect of other antiepileptic and sedative-hypnotic drugs, initial encounter: Secondary | ICD-10-CM | POA: Insufficient documentation

## 2020-05-17 DIAGNOSIS — Z79899 Other long term (current) drug therapy: Secondary | ICD-10-CM | POA: Insufficient documentation

## 2020-05-17 MED ORDER — PHENYLEPHRINE 200 MCG/ML FOR PRIAPISM / HYPOTENSION
500.0000 ug | INTRAMUSCULAR | Status: DC | PRN
  Administered 2020-05-17: 500 ug via INTRACAVERNOUS
  Filled 2020-05-17: qty 50

## 2020-05-17 MED ORDER — LIDOCAINE HCL (PF) 1 % IJ SOLN
5.0000 mL | Freq: Once | INTRAMUSCULAR | Status: AC
  Administered 2020-05-17: 5 mL
  Filled 2020-05-17: qty 30

## 2020-05-17 NOTE — Discharge Instructions (Signed)
One please discontinue using your trazodone. You should never take this medication again. Contact your prescribing physician if you need another medication for sleep. Secondly, you may have some bruising and swelling along the shaft of the penis and the skin secondary to the trauma from the aspiration. You may apply ice over a towel to the affected area. Please do not engage in any sexual intercourse or do anything that may cause you to have interaction with next 48 hours. Please follow closely with [facility] for any further management.  Contact a health care provider if: Your pain gets worse. Your pain does not improve with treatment. You have recurrent priapism and your erection does not go away in 4 hours. Get help right away if: You have a fever or chills. You have more pain, swelling, or redness in your genitals or your groin area.

## 2020-05-17 NOTE — ED Triage Notes (Signed)
Reports an erection since 6am, took trazodone around 11pm last night, reports that this caused it. Denies Viagra use. Reports erection is getting painful.

## 2020-05-17 NOTE — ED Provider Notes (Signed)
Joshua Stanton   CSN: [id number] Arrival date & time: 05/17/20  1884     History No chief complaint on file.   Joshua Stanton is a 59 y.o. male who presents emergency department with chief complaint of painful erection.  Patient states that his erection began this morning at 6 AM.  He states that he has had them in the past but has never lasted this long.  He states that it usually occurs after he takes trazodone to sleep.  He took trazodone last night.  Patient has now had an erection for 10 hours.  He states that it is starting to get very uncomfortable. HPI     Past Medical History:  Diagnosis Date  . Arthritis    "lower back" (09/28/2015)  . Atypical angina ([place]) dx'd 09/2015  . GERD (gastroesophageal reflux disease)   . High cholesterol   . History of hiatal hernia   . Hypertension   . OSA on CPAP   . Palpitation    last occurrence saturday 03-06-2019 , per patient , ame and went quickly, took his metoprolol at first sign of it . no reoccurence since that time   . Prediabetes   . Seasonal allergies     Patient Active Problem List   Diagnosis Date Noted  . Hyperlipidemia due to dietary fat intake 01/13/2020  . DOE (dyspnea on exertion) 01/13/2020  . Lower extremity edema 01/13/2020  . Intolerance, food 11/22/2019  . Nonallergic rhinitis 11/22/2019  . Abdominal pain, epigastric 03/09/2019  . Esophageal reflux 03/09/2019  . Nausea 03/09/2019  . PAT (paroxysmal atrial tachycardia) ([place]) 11/30/2018  . Essential hypertension 09/21/2015  . Obstructive sleep apnea 09/21/2015  . Abnormal finding on EKG - anterolateral biphasic T waves with ST depression 09/21/2015  . Acute medial meniscal tear 04/06/2013    Past Surgical History:  Procedure Laterality Date  . ANKLE HARDWARE REMOVAL Left 2006   left   . BIOPSY  03/09/2019   Procedure: BIOPSY;  Surgeon: Wilford Corner, MD;  Location: [facility];  Service:  Endoscopy;;  . CARDIAC CATHETERIZATION N/A 09/28/2015   Procedure: Left Heart Cath and Coronary Angiography;  Surgeon: Wellington Hampshire, MD;  Location: [facility];  Service: Cardiovascular;: For possible atypical angina, mildly elevated troponins.  Angiographically normal coronary arteries  . CARDIAC EVENT MONITOR  09/2018   Heart rate range 47 bpm - 190 bpm.  Average heart rate 70 bpm.  Low burden of PACs and PVCs.  3 short runs of SVT/PAT with aberrancy versus V. tach.  25 bursts of PAT 3 episodes of what appear to be atrial tach versus atrial fib with a rate of 190 bpm   . COLONOSCOPY WITH PROPOFOL N/A 03/09/2019   Procedure: COLONOSCOPY WITH PROPOFOL;  Surgeon: Wilford Corner, MD;  Location: [facility];  Service: Endoscopy;  Laterality: N/A;  . ESOPHAGOGASTRODUODENOSCOPY (EGD) WITH PROPOFOL N/A 03/09/2019   Procedure: ESOPHAGOGASTRODUODENOSCOPY (EGD) WITH PROPOFOL;  Surgeon: Wilford Corner, MD;  Location: [facility];  Service: Endoscopy;  Laterality: N/A;  . FRACTURE SURGERY    . KNEE ARTHROSCOPY  09/20/2011   Procedure: ARTHROSCOPY KNEE;  Surgeon: Gearlean Alf, MD;  Location: [facility];  Service: Orthopedics;  Laterality: Right;  Medial meniscal DEBRIDEMENT with chondroplasty, right knee  . KNEE ARTHROSCOPY Left 04/07/2013   Procedure: LEFT KNEE ARTHROSCOPY WITH MEDIAL MENISCUS  DEBRIDEMENT ;  Surgeon: Gearlean Alf, MD;  Location: [facility];  Service: Orthopedics;  Laterality: Left;  .  LIPOMA EXCISION Right 2004   neck  . NASAL SEPTUM SURGERY  1992  . ORIF ANKLE FRACTURE Left 1981  . TONSILLECTOMY  2008  . TRANSTHORACIC ECHOCARDIOGRAM  09/2015   EF 60-65%. Gr 1 DD. Mod LA dilation.    Marland Kitchen UVULOPALATOPHARYNGOPLASTY  2008       Family History  Problem Relation Age of Onset  . Hypertension Mother   . Dementia Mother     Social History   Tobacco Use  . Smoking status: Never Smoker  . Smokeless tobacco: Former Systems developer    Types: Secondary school teacher  .  Vaping Use: Never used  Substance Use Topics  . Alcohol use: Yes    Comment: 09/28/2014 "couple beers/month"  . Drug use: Yes    Types: Marijuana    Comment: "quit in the 1990s"    Home Medications Prior to Admission medications   Medication Sig Start Date End Date Taking? Authorizing Provider  aspirin EC 81 MG EC tablet Take 1 tablet (81 mg total) by mouth daily. 09/22/15  Yes Ghimire, Henreitta Leber, MD  atorvastatin (LIPITOR) 40 MG tablet Take 1 tablet (40 mg total) by mouth daily. 01/13/20 05/17/20 Yes Leonie Man, MD  flecainide (TAMBOCOR) 100 MG tablet TAKE 200 MG IF FOR  RAPID HEART RATE AS DIRECTED Patient taking differently: Take 200 mg by mouth daily as needed (for high heartrate).  06/15/19  Yes Leonie Man, MD  gabapentin (NEURONTIN) 300 MG capsule Take 300-600 mg by mouth See admin instructions. Takes 2 tablets at night and additional 1 tablet during daytime if needed 12/22/19  Yes [provider]  LORazepam (ATIVAN) 1 MG tablet Take 1 mg by mouth daily as needed for anxiety.    Yes [provider]  losartan (COZAAR) 100 MG tablet Take 100 mg daily by mouth.   Yes [provider]  metoprolol succinate (TOPROL-XL) 25 MG 24 hr tablet Take 25 mg by mouth daily as needed (for High heart rate).  11/19/19  Yes [provider]  metoprolol tartrate (LOPRESSOR) 25 MG tablet TAKE 25 MG TABLET ALONG WITH 200 MG FLECAINIDE  IF FOR  RAPID HEART RATE AS DIRECTED Patient taking differently: Take 25 mg by mouth daily as needed (For high heart rate).  06/15/19  Yes Leonie Man, MD  Multiple Vitamin (MULTIVITAMIN WITH MINERALS) TABS tablet Take 1 tablet by mouth daily.   Yes [provider]  ondansetron (ZOFRAN-ODT) 4 MG disintegrating tablet Take 4 mg by mouth 3 (three) times daily as needed for nausea or vomiting.  03/17/19  Yes [provider]  pantoprazole (PROTONIX) 40 MG tablet Take 1 tablet (40 mg total) by mouth daily. 10/01/16  Yes  Rosita Fire, Brittainy M, PA-C  Probiotic Product (ALIGN) 4 MG CAPS Take 4 mg by mouth 2 (two) times daily.   Yes [provider]  saw palmetto 160 MG capsule Take 160 mg by mouth 2 (two) times daily.   Yes [provider]  TESTIM 50 MG/5GM (1%) GEL Apply 5 g topically every other day.  08/30/15  Yes [provider]  traZODone (DESYREL) 50 MG tablet Take 50 mg by mouth at bedtime as needed for sleep.  03/23/19  Yes [provider]    Allergies    Oxycodone  Review of Systems   Review of Systems Ten systems reviewed and are negative for acute change, except as noted in the HPI.   Physical Exam Updated Vital Signs BP (!) 160/93 (BP Location:  Right Arm)   Pulse 84   Temp 98.5 F (36.9 C) (Oral)   Resp 19   Ht 5\' 7"  (1.702 m)   Wt 120.2 kg   SpO2 96%   BMI 41.50 kg/m   Physical Exam Vitals and nursing Stanton reviewed. Exam conducted with a chaperone present.  Constitutional:      General: He is not in acute distress.    Appearance: He is well-developed. He is not diaphoretic.  HENT:     Head: Normocephalic and atraumatic.  Eyes:     General: No scleral icterus.    Conjunctiva/sclera: Conjunctivae normal.  Cardiovascular:     Rate and Rhythm: Normal rate and regular rhythm.     Heart sounds: Normal heart sounds.  Pulmonary:     Effort: Pulmonary effort is normal. No respiratory distress.     Breath sounds: Normal breath sounds.  Abdominal:     Palpations: Abdomen is soft.     Tenderness: There is no abdominal tenderness.  Genitourinary:    Penis: Normal and circumcised.      Comments: Erection present  Musculoskeletal:     Cervical back: Normal range of motion and neck supple.  Skin:    General: Skin is warm and dry.  Neurological:     Mental Status: He is alert.  Psychiatric:        Behavior: Behavior normal.     ED Results / Procedures / Treatments   Labs (all labs ordered are listed, but only abnormal results are displayed) Labs  Reviewed  RAPID HIV SCREEN (HIV 1/2 AB+AG)  HEPATITIS B SURFACE ANTIGEN    EKG None  Radiology No results found.  Procedures Irrigate corpus cavern, priapism  Date/Time: 05/17/2020 6:12 PM Performed by: Margarita Mail, PA-C Authorized by: Margarita Mail, PA-C  Consent: Verbal consent obtained. Risks and benefits: risks, benefits and alternatives were discussed Consent given by: patient Patient understanding: patient states understanding of the procedure being performed Patient consent: the patient's understanding of the procedure matches consent given Procedure consent: procedure consent matches procedure scheduled Required items: required blood products, implants, devices, and special equipment available Patient identity confirmed: verbally with patient Time out: Immediately prior to procedure a "time out" was called to verify the correct patient, procedure, equipment, support staff and site/side marked as required. Preparation: Patient was prepped and draped in the usual sterile fashion. Local anesthesia used: yes Anesthesia: local infiltration  Anesthesia: Local anesthesia used: yes Local Anesthetic: lidocaine 1% without epinephrine Anesthetic total: 2 mL  Sedation: Patient sedated: no  Patient tolerance: patient tolerated the procedure well with no immediate complications Comments: PRIAPISM TREATMENT (injection) Patient was prepped and draped in standard sterile fashion Just prior to procedure a timeout was performed Left corpus cavernosum was entered with a 25-gauge syringe Blood was aspirated (80 ml) 2 cc of 200 mcg/ml phenylephrine was injected The patient tolerated the procedure No complications     (including critical care time)  Medications Ordered in ED Medications  phenylephrine 200 mcg / ml CONC. DILUTION INJ (ED / Urology USE ONLY) (500 mcg Intracavernosal Given 05/17/20 1657)  lidocaine (PF) (XYLOCAINE) 1 % injection 5 mL (5 mLs Infiltration Given  05/17/20 1658)    ED Course  I have reviewed the triage vital signs and the nursing notes.  Pertinent labs & imaging results that were available during my care of the patient were reviewed by me and considered in my medical decision making (see chart for details).    MDM Rules/Calculators/A&P  59 year old male here with priapism after taking trazodone last night for sleep.  I was able to reduce the priapism using aspiration and phenylephrine injections.  Patient was observed for approximately 1 hour after resolution of priapism without return.  Patient's blood pressure is actually improved even after the phenylephrine injections likely due to reduction in his pain.  There is some incidental hematoma along the base of the penis secondary to aspiration.  Advised that the patient apply ice over a towel.  Discussed home care, outpatient follow-up with [facility] and return precautions.  I discussed the case with Dr. Alinda Money who is on-call for urology.  He advises to discontinue the trazodone.  He may follow-up for any further evaluation with [facility].  He was appropriate for discharge at this time Final Clinical Impression(s) / ED Diagnoses Final diagnoses:  None    Rx / DC Orders ED Discharge Orders    None       Margarita Mail, PA-C 05/17/20 1837    Isla Pence, MD 05/17/20 [phone]

## 2020-07-19 ENCOUNTER — Other Ambulatory Visit: Payer: Self-pay | Admitting: Cardiology

## 2020-07-24 DIAGNOSIS — H9313 Tinnitus, bilateral: Secondary | ICD-10-CM | POA: Insufficient documentation

## 2020-07-24 DIAGNOSIS — H833X3 Noise effects on inner ear, bilateral: Secondary | ICD-10-CM | POA: Insufficient documentation

## 2020-09-19 NOTE — Telephone Encounter (Signed)
Message sent to patient  Hello Mr Hagan,  I am very sorry to her of your loss. Dr [name] next available appointment is April 8,2022 at 2:40 pm   I will be glad place you there. If you are not available  please let me know. Have you used your medication flecainide and metoprolol?  How has this worked?Also it looks you have lorazepam to use for anxiety .  Ivin Booty RN

## 2020-10-16 ENCOUNTER — Ambulatory Visit (INDEPENDENT_AMBULATORY_CARE_PROVIDER_SITE_OTHER): Payer: 59

## 2020-10-16 ENCOUNTER — Encounter: Payer: Self-pay | Admitting: Radiology

## 2020-10-16 ENCOUNTER — Ambulatory Visit: Payer: 59 | Admitting: Cardiology

## 2020-10-16 ENCOUNTER — Other Ambulatory Visit: Payer: Self-pay

## 2020-10-16 ENCOUNTER — Encounter: Payer: Self-pay | Admitting: Cardiology

## 2020-10-16 VITALS — BP 140/86 | HR 58 | Ht 67.0 in | Wt 260.0 lb

## 2020-10-16 DIAGNOSIS — R0600 Dyspnea, unspecified: Secondary | ICD-10-CM

## 2020-10-16 DIAGNOSIS — I471 Supraventricular tachycardia: Secondary | ICD-10-CM

## 2020-10-16 DIAGNOSIS — I10 Essential (primary) hypertension: Secondary | ICD-10-CM | POA: Diagnosis not present

## 2020-10-16 DIAGNOSIS — R0609 Other forms of dyspnea: Secondary | ICD-10-CM

## 2020-10-16 DIAGNOSIS — G4733 Obstructive sleep apnea (adult) (pediatric): Secondary | ICD-10-CM

## 2020-10-16 DIAGNOSIS — I4719 Other supraventricular tachycardia: Secondary | ICD-10-CM

## 2020-10-16 NOTE — Patient Instructions (Addendum)
Medication Instructions:  No changes *If you need a refill on your cardiac medications before your next appointment, please call your pharmacy*   Lab Work: Not needed    Testing/Procedures: Your physician has recommended that you wear a holter monitor 14 Zio. Holter monitors are medical devices that record the heart's electrical activity. Doctors most often use these monitors to diagnose arrhythmias. Arrhythmias are problems with the speed or rhythm of the heartbeat. The monitor is a small, portable device. You can wear one while you do your normal daily activities. This is usually used to diagnose what is causing palpitations/syncope (passing out).     Follow-Up: At [facility], you and your health needs are our priority.  As part of our continuing mission to provide you with exceptional heart care, we have created designated Provider Care Teams.  These Care Teams include your primary Cardiologist (physician) and Advanced Practice Providers (APPs -  Physician Assistants and Nurse Practitioners) who all work together to provide you with the care you need, when you need it.  We recommend signing up for the patient portal called "MyChart".  Sign up information is provided on this After Visit Summary.  MyChart is used to connect with patients for Virtual Visits (Telemedicine).  Patients are able to view lab/test results, encounter notes, upcoming appointments, etc.  Non-urgent messages can be sent to your provider as well.   To learn more about what you can do with MyChart, go to [email or web address].    Your next appointment:   2 month(s)  The format for your next appointment:   Virtual Visit or in person  Provider:   Glenetta Hew, MD   Other Instructions  ZIO XT- Long Term Monitor Instructions   Your physician has requested you wear your ZIO patch monitor__14_____days.   This is a single patch monitor.  [employer] supplies one patch monitor per enrollment.  Additional  stickers are not available.   Please do not apply patch if you will be having a Nuclear Stress Test, Echocardiogram, Cardiac CT, MRI, or Chest Xray during the time frame you would be wearing the monitor. The patch cannot be worn during these tests.  You cannot remove and re-apply the ZIO XT patch monitor.   Your ZIO patch monitor will be sent USPS Priority mail from [facility] directly to your home address. The monitor may also be mailed to a PO BOX if home delivery is not available.   It may take 3-5 days to receive your monitor after you have been enrolled.   Once you have received you monitor, please review enclosed instructions.  Your monitor has already been registered assigning a specific monitor serial # to you.   Applying the monitor   Shave hair from upper left chest.   Hold abrader disc by orange tab.  Rub abrader in 40 strokes over left upper chest as indicated in your monitor instructions.   Clean area with 4 enclosed alcohol pads .  Use all pads to assure are is cleaned thoroughly.  Let dry.   Apply patch as indicated in monitor instructions.  Patch will be place under collarbone on left side of chest with arrow pointing upward.   Rub patch adhesive wings for 2 minutes.Remove white label marked "1".  Remove white label marked "2".  Rub patch adhesive wings for 2 additional minutes.   While looking in a mirror, press and release button in center of patch.  A small green light will flash 3-4 times .  This  will be your only indicator the monitor has been turned on.     Do not shower for the first 24 hours.  You may shower after the first 24 hours.   Press button if you feel a symptom. You will hear a small click.  Record Date, Time and Symptom in the Patient Log Book.   When you are ready to remove patch, follow instructions on last 2 pages of Patient Log Book.  Stick patch monitor onto last page of Patient Log Book.   Place Patient Log Book in [place] box.  Use locking  tab on box and tape box closed securely.  The [employer] and [employer] has [employer] on it.  Please place in mailbox as soon as possible.  Your physician should have your test results approximately 7 days after the monitor has been mailed back to [facility].   Call [facility] at [phone] if you have questions regarding your ZIO XT patch monitor.  Call them immediately if you see an orange light blinking on your monitor.   If your monitor falls off in less than 4 days contact our Monitor department at [phone].  If your monitor becomes loose or falls off after 4 days call [employer] at [phone] for suggestions on securing your monitor.

## 2020-10-16 NOTE — Progress Notes (Signed)
Primary Care Provider: [name], MD Cardiologist: Glenetta Hew, MD Electrophysiologist: None  Clinic Note: Chief Complaint  Patient presents with  . Follow-up    Increased HR.  Marland Kitchen Chest Pain  . Shortness of Breath  . Edema    Left ankles a little bit.    ===================================  ASSESSMENT/PLAN   Problem List Items Addressed This Visit    Essential hypertension (Chronic)    Borderline blood pressure today.  I am leery of placing a standing beta-blocker because of bradycardia.  He is on ARB at max dose, may very well require amlodipine ( dihydropyridine calcium channel blocker).  Continue monitoring the office setting.  May require additional medications in the future.      Relevant Medications   metoprolol tartrate (LOPRESSOR) 25 MG tablet   Other Relevant Orders   LONG TERM MONITOR (3-14 DAYS)   Obstructive sleep apnea (Chronic)    Joshua Stanton is sleeping a symptom Nishan his CPAP is working well.  This can exacerbate arrhythmias and hypertension.      PAT (paroxysmal atrial tachycardia) (HCC) - Primary (Chronic)    He is doing great episodes, we may end up putting him on a standing dose of flecainide, however his baseline heart rate is pretty slow.  We need to determine since the same these are lasting 10 to 15 minutes, if the there is a new true sustained arrhythmia.  Plan: 2-week Zio patch; continue as needed use of metoprolol and/or flecainide depending on length of episode.      Relevant Medications   metoprolol tartrate (LOPRESSOR) 25 MG tablet   Other Relevant Orders   EKG 12-Lead (Completed)   LONG TERM MONITOR (3-14 DAYS)   DOE (dyspnea on exertion) (Chronic)    Problems related to deconditioning and obesity.  He has had significant evaluation not explaining his symptoms.      Relevant Orders   EKG 12-Lead (Completed)   LONG TERM MONITOR (3-14 DAYS)      ===================================  HPI:    Joshua Stanton is a  60 y.o. male with a PMH notable for OSA/CPAP, PAT/flutter, PVCs and HTN who presents today for 81-month follow-up.  He has been thoroughly evaluated for ischemic evaluation including cardiac catheterization showing no significant disease.  Joshua Stanton was last seen on April 27, 2020 following a ER visit on March 25, 2020 with an episode of breakthrough rapid tachycardia.  He said he was able to break the rhythm with PRN flecainide and beta-blocker.  He still has done off-and-on fast heartbeats associate with chest discomfort.  Sometimes takes additional dose of metoprolol sometimes he does not.  Rarely has a thickened the flecainide.  Was not able to tolerate 50 mg of metoprolol tartrate therefore he was converted back to 25 mg twice daily. => Indicated he was having issue with his CPAP mask; off-and-on edema.  Somewhat deconditioned with exertional dyspnea.  Notes fatigue deconditioning related to lack of activity.  Recent Hospitalizations:   06/13/2020: ER visit for priapism.  Likely related to trazodone.  Reviewed  CV studies:    The following studies were reviewed today: (if available, images/films reviewed: From Epic Chart or Care Everywhere) . N/A:   Interval History:   Joshua Stanton returns here today because of recurrent fluttering in his chest and chest discomfort.  I actually saw him when he was visiting somebody at the hospital when he indicated he was having symptoms so he rescheduled an appointment.  He tells me  that he is having episodes maybe 2 or 3 times a week he recently purchased a Kardia mobile monitor, but is not able to capture 1.  He said the last maybe 10 to 79minutes he says he is taking his flecainide and metoprolol prescribed meds only use additional dose metoprolol but no additional flecainide.  He has been try to walk more, but has recently is not having more exertional dyspnea not been able to walk as far.  He has had off-and-on chest discomfort  and cramping sensations in his chest for the last several months but nothing prolonged and not exertional.  The main thing he notes exertional dyspnea and fatigue.  Has not had prolonged spells of tachycardia more than 10 to 15 minutes.  CV Review of Symptoms (Summary): positive for - chest pain, irregular heartbeat, orthopnea, palpitations and rapid heart rate negative for - paroxysmal nocturnal dyspnea, shortness of breath or Not really noting lightheadedness or dizziness, wooziness.  Syncope/near syncope or TIA/amaurosis fugax, claudication  The patient does not have symptoms concerning for COVID-19 infection (fever, chills, cough, or new shortness of breath).   REVIEWED OF SYSTEMS   Review of Systems  Constitutional: Positive for malaise/fatigue (Still working on CPAP.  But better) and weight loss (Making an effort).  Respiratory: Positive for shortness of breath. Negative for cough and wheezing.   Cardiovascular: Positive for leg swelling (Trivial).  Gastrointestinal: Negative for abdominal pain, blood in stool and melena.  Musculoskeletal: Positive for back pain (Off-and-on spurts) and joint pain.  Neurological: Positive for dizziness and weakness. Loss of consciousness: Mild orthostatic dizziness.  Psychiatric/Behavioral: The patient is nervous/anxious.    I have reviewed and (if needed) personally updated the patient's problem list, medications, allergies, past medical and surgical history, social and family history.   PAST MEDICAL HISTORY   Past Medical History:  Diagnosis Date  . Arthritis    "lower back" (09/28/2015)  . Atypical angina ([place]) dx'd 09/2015  . GERD (gastroesophageal reflux disease)   . High cholesterol   . History of hiatal hernia   . Hypertension   . OSA on CPAP   . Palpitation    last occurrence saturday 03-06-2019 , per patient , ame and went quickly, took his metoprolol at first sign of it . no reoccurence since that time   . Prediabetes   . Seasonal  allergies     PAST SURGICAL HISTORY   Past Surgical History:  Procedure Laterality Date  . ANKLE HARDWARE REMOVAL Left 2006   left   . BIOPSY  03/09/2019   Procedure: BIOPSY;  Surgeon: Wilford Corner, MD;  Location: [facility];  Service: Endoscopy;;  . CARDIAC CATHETERIZATION N/A 09/28/2015   Procedure: Left Heart Cath and Coronary Angiography;  Surgeon: Wellington Hampshire, MD;  Location: [facility];  Service: Cardiovascular;: For possible atypical angina, mildly elevated troponins.  Angiographically normal coronary arteries  . CARDIAC EVENT MONITOR  09/2018   Heart rate range 47 bpm - 190 bpm.  Average heart rate 70 bpm.  Low burden of PACs and PVCs.  3 short runs of SVT/PAT with aberrancy versus V. tach.  25 bursts of PAT 3 episodes of what appear to be atrial tach versus atrial fib with a rate of 190 bpm   . COLONOSCOPY WITH PROPOFOL N/A 03/09/2019   Procedure: COLONOSCOPY WITH PROPOFOL;  Surgeon: Wilford Corner, MD;  Location: [facility];  Service: Endoscopy;  Laterality: N/A;  . ESOPHAGOGASTRODUODENOSCOPY (EGD) WITH PROPOFOL N/A 03/09/2019   Procedure: ESOPHAGOGASTRODUODENOSCOPY (  EGD) WITH PROPOFOL;  Surgeon: Wilford Corner, MD;  Location: [facility];  Service: Endoscopy;  Laterality: N/A;  . FRACTURE SURGERY    . KNEE ARTHROSCOPY  09/20/2011   Procedure: ARTHROSCOPY KNEE;  Surgeon: Gearlean Alf, MD;  Location: [facility];  Service: Orthopedics;  Laterality: Right;  Medial meniscal DEBRIDEMENT with chondroplasty, right knee  . KNEE ARTHROSCOPY Left 04/07/2013   Procedure: LEFT KNEE ARTHROSCOPY WITH MEDIAL MENISCUS  DEBRIDEMENT ;  Surgeon: Gearlean Alf, MD;  Location: [facility];  Service: Orthopedics;  Laterality: Left;  . LIPOMA EXCISION Right 2004   neck  . NASAL SEPTUM SURGERY  1992  . ORIF ANKLE FRACTURE Left 1981  . TONSILLECTOMY  2008  . TRANSTHORACIC ECHOCARDIOGRAM  09/2015   EF 60-65%. Gr 1 DD. Mod LA dilation.    Marland Kitchen UVULOPALATOPHARYNGOPLASTY   2008    Immunization History  Administered Date(s) Administered  . Influenza-Unspecified 09/15/2018  . Unspecified SARS-COV-2 Vaccination 10/27/2019, 11/17/2019    MEDICATIONS/ALLERGIES   Current Meds  Medication Sig  . aspirin EC 81 MG EC tablet Take 1 tablet (81 mg total) by mouth daily.  Marland Kitchen atorvastatin (LIPITOR) 40 MG tablet Take 1 tablet (40 mg total) by mouth daily.  . flecainide (TAMBOCOR) 100 MG tablet TAKE 2 TABLETS BY MOUTH AS DIRECTED FOR RAPID HEART RATE (Patient taking differently: TAKE 2 TABLETS BY MOUTH AS DIRECTED FOR RAPID HEART RATE AS NEEDED.)  . gabapentin (NEURONTIN) 300 MG capsule Take 300-600 mg by mouth See admin instructions. Takes 2 tablets at night and additional 1 tablet during daytime if needed  . LORazepam (ATIVAN) 1 MG tablet Take 1 mg by mouth daily as needed for anxiety.   Marland Kitchen losartan (COZAAR) 100 MG tablet Take 100 mg daily by mouth.  . metoprolol tartrate (LOPRESSOR) 25 MG tablet Take 25 mg by mouth daily as needed.  . Multiple Vitamin (MULTIVITAMIN WITH MINERALS) TABS tablet Take 1 tablet by mouth daily.  . ondansetron (ZOFRAN-ODT) 4 MG disintegrating tablet Take 4 mg by mouth 3 (three) times daily as needed for nausea or vomiting.   . saw palmetto 160 MG capsule Take 160 mg by mouth 2 (two) times daily.  . TESTIM 50 MG/5GM (1%) GEL Apply 5 g topically every other day.   . [DISCONTINUED] metoprolol succinate (TOPROL-XL) 25 MG 24 hr tablet Take 25 mg by mouth daily as needed (for High heart rate).   . [DISCONTINUED] metoprolol tartrate (LOPRESSOR) 25 MG tablet TAKE 1 TABLET BY MOUTH WITH FLECAINIDE FOR RAPID HEART RATE AS DIRECTED  . [DISCONTINUED] pantoprazole (PROTONIX) 40 MG tablet Take 1 tablet (40 mg total) by mouth daily.  . [DISCONTINUED] Probiotic Product (ALIGN) 4 MG CAPS Take 4 mg by mouth 2 (two) times daily.  . [DISCONTINUED] traZODone (DESYREL) 50 MG tablet Take 50 mg by mouth at bedtime as needed for sleep.     Allergies  Allergen  Reactions  . Oxycodone Nausea Only    SOCIAL HISTORY/FAMILY HISTORY   Reviewed in Epic:  Pertinent findings:  Social History   Tobacco Use  . Smoking status: Never Smoker  . Smokeless tobacco: Former Systems developer    Types: Secondary school teacher  . Vaping Use: Never used  Substance Use Topics  . Alcohol use: Yes    Comment: 09/28/2014 "couple beers/month"  . Drug use: Yes    Types: Marijuana    Comment: "quit in the 1990s"   Social History   Social History Narrative  . Not on file  OBJCTIVE -PE, EKG, labs   Wt Readings from Last 3 Encounters:  10/16/20 260 lb (117.9 kg)  05/17/20 265 lb (120.2 kg)  04/27/20 266 lb 9.6 oz (120.9 kg)    Physical Exam: BP 140/86 (BP Location: Left Arm, Patient Position: Sitting, Cuff Size: Large)   Pulse (!) 58   Ht 5\' 7"  (1.702 m)   Wt 260 lb (117.9 kg)   BMI 40.72 kg/m  Physical Exam Vitals reviewed.  Constitutional:      General: He is not in acute distress.    Appearance: He is well-developed. He is not ill-appearing or toxic-appearing.     Comments: Anxious  HENT:     Head: Normocephalic and atraumatic.     Ears:     Comments: Ears look red, Neck:     Vascular: No carotid bruit.  Cardiovascular:     Rate and Rhythm: Normal rate and regular rhythm.     Pulses: Normal pulses.     Heart sounds: Normal heart sounds.  Pulmonary:     Effort: Pulmonary effort is normal.     Breath sounds: Normal breath sounds. No wheezing.     Comments: Distant breath sounds. Chest:     Chest wall: No tenderness.  Abdominal:     Comments: Obese.  Soft/NT/ND/NABS.  No HSM  Musculoskeletal:        General: Swelling (Trivial ankle) present. Normal range of motion.     Cervical back: Normal range of motion and neck supple.  Lymphadenopathy:     Cervical: No cervical adenopathy.  Skin:    General: Skin is warm and dry.  Neurological:     General: No focal deficit present.     Mental Status: He is alert and oriented to person, place, and time.      Gait: Gait abnormal.  Psychiatric:        Mood and Affect: Mood normal.        Thought Content: Thought content normal.        Judgment: Judgment normal.     Comments: Seems anxious      Adult ECG Report  Rate: 58 ;  Rhythm: normal sinus rhythm and LVH with repolarization abnormality.  T wave inversions in anterior leads, cannot exclude ischemia.  Stable EKG.  No change;   Narrative Interpretation: Borderline EKG.  Recent Labs:   09/04/2020: TC 135, TG 112, HDL 41 LDL 93.  A1c 6.4.  Cr 1.18 Lab Results  Component Value Date   CHOL 184 09/21/2015   HDL 38 (L) 09/21/2015   LDLCALC 125 (H) 09/21/2015   TRIG 106 09/21/2015   CHOLHDL 4.8 09/21/2015   Lab Results  Component Value Date   CREATININE 1.01 03/25/2020   BUN 13 03/25/2020   NA 140 03/25/2020   K 3.3 (L) 03/25/2020   CL 102 03/25/2020   CO2 29 03/25/2020   CBC Latest Ref Rng & Units 03/25/2020 05/14/2017 09/29/2015  WBC 4.0 - 10.5 K/uL 8.0 8.1 7.7  Hemoglobin 13.0 - 17.0 g/dL 13.9 16.1 14.2  Hematocrit 39.0 - 52.0 % 43.2 47.3 43.7  Platelets 150 - 400 K/uL 238 224 229    No results found for: TSH  ==================================================  COVID-19 Education: The signs and symptoms of COVID-19 were discussed with the patient and how to seek care for testing (follow up with PCP or arrange E-visit).   The importance of social distancing and COVID-19 vaccination was discussed today. The patient is practicing social distancing & Masking.   I  spent a total of 25 minutes with the patient spent in direct patient consultation.  Additional time spent with chart review  / charting (studies, outside notes, etc): 16 min Total Time: 31 min   Current medicines are reviewed at length with the patient today.  (+/- concerns) He asked about spending the month with medications, have voiced.  We talked about as needed use of metoprolol as well as flecainide.  This visit occurred during the SARS-CoV-2 public health  emergency.  Safety protocols were in place, including screening questions prior to the visit, additional usage of staff PPE, and extensive cleaning of exam room while observing appropriate contact time as indicated for disinfecting solutions.  Notice: This dictation was prepared with Dragon dictation along with smaller phrase technology. Any transcriptional errors that result from this process are unintentional and may not be corrected upon review.  Patient Instructions / Medication Changes & Studies & Tests Ordered   Patient Instructions  Medication Instructions:  No changes *If you need a refill on your cardiac medications before your next appointment, please call your pharmacy*   Lab Work: Not needed    Testing/Procedures: Your physician has recommended that you wear a holter monitor 14 Zio. Holter monitors are medical devices that record the heart's electrical activity. Doctors most often use these monitors to diagnose arrhythmias. Arrhythmias are problems with the speed or rhythm of the heartbeat. The monitor is a small, portable device. You can wear one while you do your normal daily activities. This is usually used to diagnose what is causing palpitations/syncope (passing out).     Follow-Up: At [facility], you and your health needs are our priority.  As part of our continuing mission to provide you with exceptional heart care, we have created designated Provider Care Teams.  These Care Teams include your primary Cardiologist (physician) and Advanced Practice Providers (APPs -  Physician Assistants and Nurse Practitioners) who all work together to provide you with the care you need, when you need it.  We recommend signing up for the patient portal called "MyChart".  Sign up information is provided on this After Visit Summary.  MyChart is used to connect with patients for Virtual Visits (Telemedicine).  Patients are able to view lab/test results, encounter notes, upcoming  appointments, etc.  Non-urgent messages can be sent to your provider as well.   To learn more about what you can do with MyChart, go to [email or web address].    Your next appointment:   2 month(s)  The format for your next appointment:   Virtual Visit or in person  Provider:   Glenetta Hew, MD   Other Instructions  ZIO XT- Long Term Monitor Instructions   Your physician has requested you wear your ZIO patch monitor__14_____days.   This is a single patch monitor.  [employer] supplies one patch monitor per enrollment.  Additional stickers are not available.   Please do not apply patch if you will be having a Nuclear Stress Test, Echocardiogram, Cardiac CT, MRI, or Chest Xray during the time frame you would be wearing the monitor. The patch cannot be worn during these tests.  You cannot remove and re-apply the ZIO XT patch monitor.   Your ZIO patch monitor will be sent USPS Priority mail from [facility] directly to your home address. The monitor may also be mailed to a PO BOX if home delivery is not available.   It may take 3-5 days to receive your monitor after you have been enrolled.   Once  you have received you monitor, please review enclosed instructions.  Your monitor has already been registered assigning a specific monitor serial # to you.   Applying the monitor   Shave hair from upper left chest.   Hold abrader disc by orange tab.  Rub abrader in 40 strokes over left upper chest as indicated in your monitor instructions.   Clean area with 4 enclosed alcohol pads .  Use all pads to assure are is cleaned thoroughly.  Let dry.   Apply patch as indicated in monitor instructions.  Patch will be place under collarbone on left side of chest with arrow pointing upward.   Rub patch adhesive wings for 2 minutes.Remove white label marked "1".  Remove white label marked "2".  Rub patch adhesive wings for 2 additional minutes.   While looking in a mirror, press and release  button in center of patch.  A small green light will flash 3-4 times .  This will be your only indicator the monitor has been turned on.     Do not shower for the first 24 hours.  You may shower after the first 24 hours.   Press button if you feel a symptom. You will hear a small click.  Record Date, Time and Symptom in the Patient Log Book.   When you are ready to remove patch, follow instructions on last 2 pages of Patient Log Book.  Stick patch monitor onto last page of Patient Log Book.   Place Patient Log Book in [place] box.  Use locking tab on box and tape box closed securely.  The [employer] and [employer] has [employer] on it.  Please place in mailbox as soon as possible.  Your physician should have your test results approximately 7 days after the monitor has been mailed back to [facility].   Call [place] at [phone] if you have questions regarding your ZIO XT patch monitor.  Call them immediately if you see an orange light blinking on your monitor.   If your monitor falls off in less than 4 days contact our Monitor department at [phone].  If your monitor becomes loose or falls off after 4 days call [employer] at [phone] for suggestions on securing your monitor.       Studies Ordered:   Orders Placed This Encounter  Procedures  . LONG TERM MONITOR (3-14 DAYS)  . EKG 12-Lead     Glenetta Hew, M.D., M.S. Interventional Cardiologist   Pager # [phone] Phone # [phone] [address] [place], [place] [address]   Thank you for choosing Heartcare at [facility]!!

## 2020-10-16 NOTE — Progress Notes (Signed)
Enrolled patient for a 14 day Zio XT Monitor to be mailed to patients home  

## 2020-10-19 ENCOUNTER — Encounter: Payer: Self-pay | Admitting: Cardiology

## 2020-10-19 DIAGNOSIS — I10 Essential (primary) hypertension: Secondary | ICD-10-CM | POA: Diagnosis not present

## 2020-10-19 DIAGNOSIS — I471 Supraventricular tachycardia: Secondary | ICD-10-CM | POA: Diagnosis not present

## 2020-10-19 DIAGNOSIS — R0600 Dyspnea, unspecified: Secondary | ICD-10-CM

## 2020-10-19 NOTE — Assessment & Plan Note (Signed)
Problems related to deconditioning and obesity.  He has had significant evaluation not explaining his symptoms.

## 2020-10-19 NOTE — Assessment & Plan Note (Signed)
He is doing great episodes, we may end up putting him on a standing dose of flecainide, however his baseline heart rate is pretty slow.  We need to determine since the same these are lasting 10 to 15 minutes, if the there is a new true sustained arrhythmia.  Plan: 2-week Zio patch; continue as needed use of metoprolol and/or flecainide depending on length of episode.

## 2020-10-19 NOTE — Assessment & Plan Note (Signed)
Borderline blood pressure today.  I am leery of placing a standing beta-blocker because of bradycardia.  He is on ARB at max dose, may very well require amlodipine ( dihydropyridine calcium channel blocker).  Continue monitoring the office setting.  May require additional medications in the future.

## 2020-10-19 NOTE — Progress Notes (Incomplete)
Primary Care Provider: [name], MD Cardiologist: Glenetta Hew, MD Electrophysiologist: None  Clinic Note: Chief Complaint  Patient presents with  . Follow-up    Increased HR.  Joshua Stanton Chest Pain  . Shortness of Breath  . Edema    Left ankles a little bit.    ===================================  ASSESSMENT/PLAN   Problem List Items Addressed This Visit    Essential hypertension (Chronic)    Borderline blood pressure today.  I am leery of placing a standing beta-blocker because of bradycardia.  He is on ARB at max dose, may very well require amlodipine ( dihydropyridine calcium channel blocker).  Continue monitoring the office setting.  May require additional medications in the future.      Relevant Medications   metoprolol tartrate (LOPRESSOR) 25 MG tablet   Other Relevant Orders   LONG TERM MONITOR (3-14 DAYS)   Obstructive sleep apnea (Chronic)    [name] is sleeping a symptom Nishan his CPAP is working well.  This can exacerbate arrhythmias and hypertension.      PAT (paroxysmal atrial tachycardia) (HCC) - Primary (Chronic)    He is doing great episodes, we may end up putting him on a standing dose of flecainide, however his baseline heart rate is pretty slow.  We need to determine since the same these are lasting 10 to 15 minutes, if the there is a new true sustained arrhythmia.  Plan: 2-week Zio patch; continue as needed use of metoprolol and/or flecainide depending on length of episode.      Relevant Medications   metoprolol tartrate (LOPRESSOR) 25 MG tablet   Other Relevant Orders   EKG 12-Lead (Completed)   LONG TERM MONITOR (3-14 DAYS)   DOE (dyspnea on exertion) (Chronic)    Problems related to deconditioning and obesity.  He has had significant evaluation not explaining his symptoms.      Relevant Orders   EKG 12-Lead (Completed)   LONG TERM MONITOR (3-14 DAYS)      ===================================  HPI:    Joshua Stanton is a  60 y.o. male with a PMH notable for OSA/CPAP, PAT/flutter, PVCs and HTN who presents today for 41-month follow-up.  He has been thoroughly evaluated for ischemic evaluation including cardiac catheterization showing no significant disease.  [name] was last seen on April 27, 2020 following a ER visit on March 25, 2020 with an episode of breakthrough rapid tachycardia.  He said he was able to break the rhythm with PRN flecainide and beta-blocker.  He still has done off-and-on fast heartbeats associate with chest discomfort.  Sometimes takes additional dose of metoprolol sometimes he does not.  Rarely has a thickened the flecainide.  Was not able to tolerate 50 mg of metoprolol tartrate therefore he was converted back to 25 mg twice daily. => Indicated he was having issue with his CPAP mask; off-and-on edema.  Somewhat deconditioned with exertional dyspnea.  Notes fatigue deconditioning related to lack of activity.  Recent Hospitalizations:   06/13/2020: ER visit for priapism.  Likely related to trazodone.  Reviewed  CV studies:    The following studies were reviewed today: (if available, images/films reviewed: From Epic Chart or Care Everywhere) . N/A:   Interval History:   Joshua Stanton returns here today because of recurrent fluttering in his chest and chest discomfort.  I actually saw him when he was visiting somebody at the hospital when he indicated he was having symptoms so he rescheduled an appointment.  He tells me  that he is having episodes maybe 2 or 3 times a week he recently purchased a Kardia mobile monitor, but is not able to capture 1.  He said the last maybe 10 to 77minutes he says he is taking his flecainide and metoprolol prescribed meds only use additional dose metoprolol but no additional flecainide.  He has been try to walk more, but has recently is not having more exertional dyspnea not been able to walk as far.  He has had off-and-on chest discomfort  and cramping sensations in his chest for the last several months but nothing prolonged and not exertional.  The main thing he notes exertional dyspnea and fatigue.  Has not had prolonged spells of tachycardia more than 10 to 15 minutes.  CV Review of Symptoms (Summary): positive for - chest pain, irregular heartbeat, orthopnea, palpitations and rapid heart rate negative for - paroxysmal nocturnal dyspnea, shortness of breath or Not really noting lightheadedness or dizziness, wooziness.  Syncope/near syncope or TIA/amaurosis fugax, claudication  The patient {does/does not:200015} have symptoms concerning for COVID-19 infection (fever, chills, cough, or new shortness of breath).   REVIEWED OF SYSTEMS   Review of Systems  Constitutional: Positive for malaise/fatigue (Still working on CPAP.  But better) and weight loss (Making an effort).  Respiratory: Positive for shortness of breath. Negative for cough and wheezing.   Cardiovascular: Positive for leg swelling (Trivial).  Gastrointestinal: Negative for abdominal pain, blood in stool and melena.  Musculoskeletal: Positive for back pain (Off-and-on spurts) and joint pain.  Neurological: Positive for dizziness and weakness. Loss of consciousness: Mild orthostatic dizziness.  Psychiatric/Behavioral: The patient is nervous/anxious.    I have reviewed and (if needed) personally updated the patient's problem list, medications, allergies, past medical and surgical history, social and family history.   PAST MEDICAL HISTORY   Past Medical History:  Diagnosis Date  . Arthritis    "lower back" (09/28/2015)  . Atypical angina ([place]) dx'd 09/2015  . GERD (gastroesophageal reflux disease)   . High cholesterol   . History of hiatal hernia   . Hypertension   . OSA on CPAP   . Palpitation    last occurrence saturday 03-06-2019 , per patient , ame and went quickly, took his metoprolol at first sign of it . no reoccurence since that time   . Prediabetes   .  Seasonal allergies     PAST SURGICAL HISTORY   Past Surgical History:  Procedure Laterality Date  . ANKLE HARDWARE REMOVAL Left 2006   left   . BIOPSY  03/09/2019   Procedure: BIOPSY;  Surgeon: Wilford Corner, MD;  Location: [facility];  Service: Endoscopy;;  . CARDIAC CATHETERIZATION N/A 09/28/2015   Procedure: Left Heart Cath and Coronary Angiography;  Surgeon: Wellington Hampshire, MD;  Location: [facility];  Service: Cardiovascular;: For possible atypical angina, mildly elevated troponins.  Angiographically normal coronary arteries  . CARDIAC EVENT MONITOR  09/2018   Heart rate range 47 bpm - 190 bpm.  Average heart rate 70 bpm.  Low burden of PACs and PVCs.  3 short runs of SVT/PAT with aberrancy versus V. tach.  25 bursts of PAT 3 episodes of what appear to be atrial tach versus atrial fib with a rate of 190 bpm   . COLONOSCOPY WITH PROPOFOL N/A 03/09/2019   Procedure: COLONOSCOPY WITH PROPOFOL;  Surgeon: Wilford Corner, MD;  Location: [facility];  Service: Endoscopy;  Laterality: N/A;  . ESOPHAGOGASTRODUODENOSCOPY (EGD) WITH PROPOFOL N/A 03/09/2019   Procedure: ESOPHAGOGASTRODUODENOSCOPY (  EGD) WITH PROPOFOL;  Surgeon: Wilford Corner, MD;  Location: [facility];  Service: Endoscopy;  Laterality: N/A;  . FRACTURE SURGERY    . KNEE ARTHROSCOPY  09/20/2011   Procedure: ARTHROSCOPY KNEE;  Surgeon: Gearlean Alf, MD;  [facility];  Service: Orthopedics;  Laterality: Right;  Medial meniscal DEBRIDEMENT with chondroplasty, right knee  . KNEE ARTHROSCOPY Left 04/07/2013   Procedure: LEFT KNEE ARTHROSCOPY WITH MEDIAL MENISCUS  DEBRIDEMENT ;  Surgeon: Gearlean Alf, MD;  Location: [facility];  Service: Orthopedics;  Laterality: Left;  . LIPOMA EXCISION Right 2004   neck  . NASAL SEPTUM SURGERY  1992  . ORIF ANKLE FRACTURE Left 1981  . TONSILLECTOMY  2008  . TRANSTHORACIC ECHOCARDIOGRAM  09/2015   EF 60-65%. Gr 1 DD. Mod LA dilation.    Joshua Stanton  UVULOPALATOPHARYNGOPLASTY  2008    Immunization History  Administered Date(s) Administered  . Influenza-Unspecified 09/15/2018  . Unspecified SARS-COV-2 Vaccination 10/27/2019, 11/17/2019    MEDICATIONS/ALLERGIES   Current Meds  Medication Sig  . aspirin EC 81 MG EC tablet Take 1 tablet (81 mg total) by mouth daily.  Joshua Stanton atorvastatin (LIPITOR) 40 MG tablet Take 1 tablet (40 mg total) by mouth daily.  . flecainide (TAMBOCOR) 100 MG tablet TAKE 2 TABLETS BY MOUTH AS DIRECTED FOR RAPID HEART RATE (Patient taking differently: TAKE 2 TABLETS BY MOUTH AS DIRECTED FOR RAPID HEART RATE AS NEEDED.)  . gabapentin (NEURONTIN) 300 MG capsule Take 300-600 mg by mouth See admin instructions. Takes 2 tablets at night and additional 1 tablet during daytime if needed  . LORazepam (ATIVAN) 1 MG tablet Take 1 mg by mouth daily as needed for anxiety.   Joshua Stanton losartan (COZAAR) 100 MG tablet Take 100 mg daily by mouth.  . metoprolol tartrate (LOPRESSOR) 25 MG tablet Take 25 mg by mouth daily as needed.  . Multiple Vitamin (MULTIVITAMIN WITH MINERALS) TABS tablet Take 1 tablet by mouth daily.  . ondansetron (ZOFRAN-ODT) 4 MG disintegrating tablet Take 4 mg by mouth 3 (three) times daily as needed for nausea or vomiting.   . saw palmetto 160 MG capsule Take 160 mg by mouth 2 (two) times daily.  . TESTIM 50 MG/5GM (1%) GEL Apply 5 g topically every other day.   . [DISCONTINUED] metoprolol succinate (TOPROL-XL) 25 MG 24 hr tablet Take 25 mg by mouth daily as needed (for High heart rate).   . [DISCONTINUED] metoprolol tartrate (LOPRESSOR) 25 MG tablet TAKE 1 TABLET BY MOUTH WITH FLECAINIDE FOR RAPID HEART RATE AS DIRECTED  . [DISCONTINUED] pantoprazole (PROTONIX) 40 MG tablet Take 1 tablet (40 mg total) by mouth daily.  . [DISCONTINUED] Probiotic Product (ALIGN) 4 MG CAPS Take 4 mg by mouth 2 (two) times daily.  . [DISCONTINUED] traZODone (DESYREL) 50 MG tablet Take 50 mg by mouth at bedtime as needed for sleep.      Allergies  Allergen Reactions  . Oxycodone Nausea Only    SOCIAL HISTORY/FAMILY HISTORY   Reviewed in Epic:  Pertinent findings:  Social History   Tobacco Use  . Smoking status: Never Smoker  . Smokeless tobacco: Former Systems developer    Types: Secondary school teacher  . Vaping Use: Never used  Substance Use Topics  . Alcohol use: Yes    Comment: 09/28/2014 "couple beers/month"  . Drug use: Yes    Types: Marijuana    Comment: "quit in the 1990s"   Social History   Social History Narrative  . Not on file  OBJCTIVE -PE, EKG, labs   Wt Readings from Last 3 Encounters:  10/16/20 260 lb (117.9 kg)  05/17/20 265 lb (120.2 kg)  04/27/20 266 lb 9.6 oz (120.9 kg)    Physical Exam: BP 140/86 (BP Location: Left Arm, Patient Position: Sitting, Cuff Size: Large)   Pulse (!) 58   Ht 5\' 7"  (1.702 m)   Wt 260 lb (117.9 kg)   BMI 40.72 kg/m  Physical Exam Vitals reviewed.  Constitutional:      General: He is not in acute distress.    Appearance: He is well-developed. He is not ill-appearing or toxic-appearing.     Comments: Anxious  HENT:     Head: Normocephalic and atraumatic.     Ears:     Comments: Ears look red, Neck:     Vascular: No carotid bruit.  Cardiovascular:     Rate and Rhythm: Normal rate and regular rhythm.     Pulses: Normal pulses.     Heart sounds: Normal heart sounds.  Pulmonary:     Effort: Pulmonary effort is normal.     Breath sounds: Normal breath sounds. No wheezing.     Comments: Distant breath sounds. Chest:     Chest wall: No tenderness.  Abdominal:     Comments: Obese.  Soft/NT/ND/NABS.  No HSM  Musculoskeletal:        General: Swelling (Trivial ankle) present. Normal range of motion.     Cervical back: Normal range of motion and neck supple.  Lymphadenopathy:     Cervical: No cervical adenopathy.  Skin:    General: Skin is warm and dry.  Neurological:     General: No focal deficit present.     Mental Status: He is alert and oriented to  person, place, and time.     Gait: Gait abnormal.  Psychiatric:        Mood and Affect: Mood normal.        Thought Content: Thought content normal.        Judgment: Judgment normal.     Comments: Seems anxious      Adult ECG Report  Rate: 58 ;  Rhythm: normal sinus rhythm and LVH with repolarization abnormality.  T wave inversions in anterior leads, cannot exclude ischemia.  Stable EKG.  No change;   Narrative Interpretation: Borderline EKG.  Recent Labs:   09/04/2020: TC 135, TG 112, HDL 41 LDL 93.  A1c 6.4.  Cr 1.18 Lab Results  Component Value Date   CHOL 184 09/21/2015   HDL 38 (L) 09/21/2015   LDLCALC 125 (H) 09/21/2015   TRIG 106 09/21/2015   CHOLHDL 4.8 09/21/2015   Lab Results  Component Value Date   CREATININE 1.01 03/25/2020   BUN 13 03/25/2020   NA 140 03/25/2020   K 3.3 (L) 03/25/2020   CL 102 03/25/2020   CO2 29 03/25/2020   CBC Latest Ref Rng & Units 03/25/2020 05/14/2017 09/29/2015  WBC 4.0 - 10.5 K/uL 8.0 8.1 7.7  Hemoglobin 13.0 - 17.0 g/dL 13.9 16.1 14.2  Hematocrit 39.0 - 52.0 % 43.2 47.3 43.7  Platelets 150 - 400 K/uL 238 224 229    No results found for: TSH  ==================================================  COVID-19 Education: The signs and symptoms of COVID-19 were discussed with the patient and how to seek care for testing (follow up with PCP or arrange E-visit).   The importance of social distancing and COVID-19 vaccination was discussed today. The patient is practicing social distancing & Masking.   I  spent a total of 25 minutes with the patient spent in direct patient consultation.  Additional time spent with chart review  / charting (studies, outside notes, etc): 16 min Total Time: 31 min   Current medicines are reviewed at length with the patient today.  (+/- concerns) He asked about spending the month with medications, have voiced.  We talked about as needed use of metoprolol as well as flecainide.  This visit occurred during the  SARS-CoV-2 public health emergency.  Safety protocols were in place, including screening questions prior to the visit, additional usage of staff PPE, and extensive cleaning of exam room while observing appropriate contact time as indicated for disinfecting solutions.  Notice: This dictation was prepared with Dragon dictation along with smaller phrase technology. Any transcriptional errors that result from this process are unintentional and may not be corrected upon review.  Patient Instructions / Medication Changes & Studies & Tests Ordered   Patient Instructions  Medication Instructions:  No changes *If you need a refill on your cardiac medications before your next appointment, please call your pharmacy*   Lab Work: Not needed    Testing/Procedures: Your physician has recommended that you wear a holter monitor 14 Zio. Holter monitors are medical devices that record the heart's electrical activity. Doctors most often use these monitors to diagnose arrhythmias. Arrhythmias are problems with the speed or rhythm of the heartbeat. The monitor is a small, portable device. You can wear one while you do your normal daily activities. This is usually used to diagnose what is causing palpitations/syncope (passing out).     Follow-Up: At [facility], you and your health needs are our priority.  As part of our continuing mission to provide you with exceptional heart care, we have created designated Provider Care Teams.  These Care Teams include your primary Cardiologist (physician) and Advanced Practice Providers (APPs -  Physician Assistants and Nurse Practitioners) who all work together to provide you with the care you need, when you need it.  We recommend signing up for the patient portal called "MyChart".  Sign up information is provided on this After Visit Summary.  MyChart is used to connect with patients for Virtual Visits (Telemedicine).  Patients are able to view lab/test results, encounter  notes, upcoming appointments, etc.  Non-urgent messages can be sent to your provider as well.   To learn more about what you can do with MyChart, go to [email or web address].    Your next appointment:   2 month(s)  The format for your next appointment:   Virtual Visit or in person  Provider:   Glenetta Hew, MD   Other Instructions  ZIO XT- Long Term Monitor Instructions   Your physician has requested you wear your ZIO patch monitor__14_____days.   This is a single patch monitor.  Ir[employer] supplies one patch monitor per enrollment.  Additional stickers are not available.   Please do not apply patch if you will be having a Nuclear Stress Test, Echocardiogram, Cardiac CT, MRI, or Chest Xray during the time frame you would be wearing the monitor. The patch cannot be worn during these tests.  You cannot remove and re-apply the ZIO XT patch monitor.   Your ZIO patch monitor will be sent USPS Priority mail from [facility] directly to your home address. The monitor may also be mailed to a PO BOX if home delivery is not available.   It may take 3-5 days to receive your monitor after you have been enrolled.   Once  you have received you monitor, please review enclosed instructions.  Your monitor has already been registered assigning a specific monitor serial # to you.   Applying the monitor   Shave hair from upper left chest.   Hold abrader disc by orange tab.  Rub abrader in 40 strokes over left upper chest as indicated in your monitor instructions.   Clean area with 4 enclosed alcohol pads .  Use all pads to assure are is cleaned thoroughly.  Let dry.   Apply patch as indicated in monitor instructions.  Patch will be place under collarbone on left side of chest with arrow pointing upward.   Rub patch adhesive wings for 2 minutes.Remove white label marked "1".  Remove white label marked "2".  Rub patch adhesive wings for 2 additional minutes.   While looking in a mirror,  press and release button in center of patch.  A small green light will flash 3-4 times .  This will be your only indicator the monitor has been turned on.     Do not shower for the first 24 hours.  You may shower after the first 24 hours.   Press button if you feel a symptom. You will hear a small click.  Record Date, Time and Symptom in the Patient Log Book.   When you are ready to remove patch, follow instructions on last 2 pages of Patient Log Book.  Stick patch monitor onto last page of Patient Log Book.   Place Patient Log Book in [place] box.  Use locking tab on box and tape box closed securely.  The [employer] and [employer] has [employer] on it.  Please place in mailbox as soon as possible.  Your physician should have your test results approximately 7 days after the monitor has been mailed back to [facility].   Call [place] at [phone] if you have questions regarding your ZIO XT patch monitor.  Call them immediately if you see an orange light blinking on your monitor.   If your monitor falls off in less than 4 days contact our Monitor department at [phone].  If your monitor becomes loose or falls off after 4 days call [employer] at [phone] for suggestions on securing your monitor.       Studies Ordered:   Orders Placed This Encounter  Procedures  . LONG TERM MONITOR (3-14 DAYS)  . EKG 12-Lead     Glenetta Hew, M.D., M.S. Interventional Cardiologist   Pager # [phone] Phone # [phone] [address] [place], [place] [address]   Thank you for choosing Heartcare at [facility]!!

## 2020-10-19 NOTE — Assessment & Plan Note (Signed)
Mr. Joshua Stanton is sleeping a symptom Nishan his CPAP is working well.  This can exacerbate arrhythmias and hypertension.

## 2020-11-08 DIAGNOSIS — R001 Bradycardia, unspecified: Secondary | ICD-10-CM

## 2021-01-04 ENCOUNTER — Telehealth: Payer: Self-pay | Admitting: *Deleted

## 2021-01-04 ENCOUNTER — Encounter: Payer: Self-pay | Admitting: Cardiology

## 2021-01-04 ENCOUNTER — Telehealth (INDEPENDENT_AMBULATORY_CARE_PROVIDER_SITE_OTHER): Payer: 59 | Admitting: Cardiology

## 2021-01-04 VITALS — BP 148/72 | HR 68 | Ht 67.0 in | Wt 256.0 lb

## 2021-01-04 DIAGNOSIS — Z79899 Other long term (current) drug therapy: Secondary | ICD-10-CM | POA: Diagnosis not present

## 2021-01-04 DIAGNOSIS — E7849 Other hyperlipidemia: Secondary | ICD-10-CM | POA: Diagnosis not present

## 2021-01-04 DIAGNOSIS — G4733 Obstructive sleep apnea (adult) (pediatric): Secondary | ICD-10-CM

## 2021-01-04 DIAGNOSIS — I10 Essential (primary) hypertension: Secondary | ICD-10-CM

## 2021-01-04 DIAGNOSIS — I471 Supraventricular tachycardia: Secondary | ICD-10-CM | POA: Diagnosis not present

## 2021-01-04 DIAGNOSIS — I480 Paroxysmal atrial fibrillation: Secondary | ICD-10-CM | POA: Insufficient documentation

## 2021-01-04 HISTORY — DX: Paroxysmal atrial fibrillation: I48.0

## 2021-01-04 MED ORDER — METOPROLOL TARTRATE 25 MG PO TABS
25.0000 mg | ORAL_TABLET | Freq: Every day | ORAL | 3 refills | Status: DC | PRN
Start: 2021-01-04 — End: 2021-09-12

## 2021-01-04 MED ORDER — FLECAINIDE ACETATE 100 MG PO TABS: ORAL_TABLET | ORAL | 6 refills | Status: DC

## 2021-01-04 MED ORDER — AMLODIPINE BESYLATE 5 MG PO TABS: 5.0000 mg | ORAL_TABLET | Freq: Every day | ORAL | 3 refills | Status: DC

## 2021-01-04 NOTE — Assessment & Plan Note (Signed)
He says his blood pressure range has been going up into the 140s now, and he is definitely hypertensive when he has these prolonged spells of tachycardia.  Probably related to anxiety and pain.  Plan: We will add amlodipine at 5 mg daily (taken at opposite day interval as losartan)

## 2021-01-04 NOTE — Assessment & Plan Note (Signed)
Partly because of OSA be related to arrhythmias, I recommend that he actually definitely continue using his CPAP he seems to be doing a good job with it.

## 2021-01-04 NOTE — Patient Instructions (Addendum)
Medication Instructions:   We will increase the number of metoprolol and flecainide tablets in your prescription by 20 tablets to the you do not run out. New prescription: Amlodipine 5 mg p.o. daily, dispense #90, 3 refill  *If you need a refill on your cardiac medications before your next appointment, please call your pharmacy*   Lab Work: CMP  please do before your appointment with Dr Rayann Heman 2022  If you have labs (blood work) drawn today and your tests are completely normal, you will receive your results only by: [place] (if you have MyChart) OR A paper copy in the mail If you have any lab test that is abnormal or we need to change your treatment, we will call you to review the results.   Testing/Procedures: Not needed   Follow-Up: At [facility], you and your health needs are our priority.  As part of our continuing mission to provide you with exceptional heart care, we have created designated Provider Care Teams.  These Care Teams include your primary Cardiologist (physician) and Advanced Practice Providers (APPs -  Physician Assistants and Nurse Practitioners) who all work together to provide you with the care you need, when you need it.    Your next appointment:   4 month(s)  The format for your next appointment:   In person or virtual  Provider:   You may see Glenetta Hew, MD or one of the following Advanced Practice Providers on your designated Care Team:   Rosaria Ferries, PA-C Jory Sims, DNP, ANP   Other Instructions I look forward to seeing what the orthopedic surgeon does for your back pain.  That you are due to see Dr. Rayann Heman in a month who can potentially help figure out ways to treat your fast heart rate spells.

## 2021-01-04 NOTE — Assessment & Plan Note (Signed)
Labs are being followed by PCP.  Joshua Stanton been drawn last October.  I do not have an available for virtual evaluation.  Continue atorvastatin.

## 2021-01-04 NOTE — Telephone Encounter (Signed)
Patient had appointment 01/04/21 with D [name]

## 2021-01-04 NOTE — Assessment & Plan Note (Signed)
Current monitor showed showed that he had less than 1% burden of what looks like A. fib.  Not atrial flutter simply because the heart rate ranges up to 192 bpm.  Longest episode was about 25 minutes next would be sort of similar to what he may be had back on the 17th when he called in.  Similar to the PAT concept, I am limited to treating with PRN medications because of resting bradycardia.  He is due to see EP, perhaps Dr. Rayann Heman would reconsider antiarrhythmic such as may be Multaq, however I am not sure how effective it would be.  He has hypertension only from a chads vascular score and therefore score of the 1.  At this point I think taking aspirin is reasonable.  Burden was very very low, however the substrate seems to be there.  Will defer to Dr. Rayann Heman as to whether or not he would consider t starting a DOAC.  For now we will continue with PRN dosing of his metoprolol tartrate and flecainide-however I recommended that he try taking 1/2 tablet metoprolol and 1/2 tablet of flecainide initially, and then if no change in 20 to 30 minutes, take additional half tablet of flecainide.

## 2021-01-04 NOTE — Assessment & Plan Note (Signed)
He is having multiple short-lived episodes of what looks like probably PAT as opposed to PSVT.  Some seem to be with aberrancy (read is VT on monitor).  These are probably precursors of  A. fib.  Unfortunately, he has relatively low resting heart rates and has been down in the 40s as well.  Would not tolerate standing dose of beta-blocker, partly because of bradycardia and mostly fatigue.  Based on how he feels with taking flecainide, he probably also would not tolerate basal flecainide at any higher dose than 50 mg twice daily.  It seems like the episodes are triggered by back pain.  Hopefully when he sees orthopedic surgery, they can treat his back pain which will limit these episodes.  He is due to see Dr. Rayann Heman next month.  Previously, Dr. Rayann Heman was wanting to avoid antiarrhythmics.  Question if now with the increased frequency of episodes do not seem to really need to be on especially if there is evidence of possible A. fib.

## 2021-01-04 NOTE — Progress Notes (Signed)
Virtual Visit via Video Note   This visit type was conducted due to national recommendations for restrictions regarding the COVID-19 Pandemic (e.g. social distancing) in an effort to limit this patient's exposure and mitigate transmission in our community.  Due to his co-morbid illnesses, this patient is at least at moderate risk for complications without adequate follow up.  This format is felt to be most appropriate for this patient at this time.  All issues noted in this document were discussed and addressed.  A limited physical exam was performed with this format.  Please refer to the patient's chart for his consent to telehealth for [facility].      Patient has given verbal permission to conduct this visit via virtual appointment and to bill insurance 01/04/2021 12:12 PM     Evaluation Performed:  Follow-up visit  Date:  01/04/2021   ID:  Joshua Stanton, DOB [birth year], MRN [id number]  Patient Location: Home Provider Location: Home Office  PCP:  Joshua Frees, MD  Cardiologist:  Joshua Hew, MD  Electrophysiologist:  Joshua Grayer, MD   Chief Complaint:   Chief Complaint  Patient presents with   Tachycardia    Having more frequent spells of tachycardia-1 major episode noted and phone calls    ====================================  ASSESSMENT & PLAN:    Problem List Items Addressed This Visit     PAF (paroxysmal atrial fibrillation) ([place])    Current monitor showed showed that he had less than 1% burden of what looks like A. fib.  Not atrial flutter simply because the heart rate ranges up to 192 bpm.  Longest episode was about 25 minutes next would be sort of similar to what he may be had back on the 17th when he called in.  Similar to the PAT concept, I am limited to treating with PRN medications because of resting bradycardia.  He is due to see EP, perhaps Joshua Stanton would reconsider antiarrhythmic such as may be Multaq, however I am not sure how effective it  would be.  He has hypertension only from a chads vascular score and therefore score of the 1.  At this point I think taking aspirin is reasonable.  Burden was very very low, however the substrate seems to be there.  Will defer to Joshua Stanton as to whether or not he would consider t starting a DOAC.  For now we will continue with PRN dosing of his metoprolol tartrate and flecainide-however I recommended that he try taking 1/2 tablet metoprolol and 1/2 tablet of flecainide initially, and then if no change in 20 to 30 minutes, take additional half tablet of flecainide.       Relevant Medications   amLODipine (NORVASC) 5 MG tablet   metoprolol tartrate (LOPRESSOR) 25 MG tablet   flecainide (TAMBOCOR) 100 MG tablet   Essential hypertension (Chronic)    He says his blood pressure range has been going up into the 140s now, and he is definitely hypertensive when he has these prolonged spells of tachycardia.  Probably related to anxiety and pain.  Plan: We will add amlodipine at 5 mg daily (taken at opposite day interval as losartan)       Relevant Medications   amLODipine (NORVASC) 5 MG tablet   metoprolol tartrate (LOPRESSOR) 25 MG tablet   flecainide (TAMBOCOR) 100 MG tablet   Obstructive sleep apnea (Chronic)    Partly because of OSA be related to arrhythmias, I recommend that he actually definitely continue using his CPAP he seems  to be doing a good job with it.       PAT (paroxysmal atrial tachycardia) (HCC) - Primary (Chronic)    He is having multiple short-lived episodes of what looks like probably PAT as opposed to PSVT.  Some seem to be with aberrancy (read is VT on monitor).  These are probably precursors of  A. fib.  Unfortunately, he has relatively low resting heart rates and has been down in the 40s as well.  Would not tolerate standing dose of beta-blocker, partly because of bradycardia and mostly fatigue.  Based on how he feels with taking flecainide, he probably also would not  tolerate basal flecainide at any higher dose than 50 mg twice daily.  It seems like the episodes are triggered by back pain.  Hopefully when he sees orthopedic surgery, they can treat his back pain which will limit these episodes.  He is due to see Joshua Stanton next month.  Previously, Joshua Stanton was wanting to avoid antiarrhythmics.  Question if now with the increased frequency of episodes do not seem to really need to be on especially if there is evidence of possible A. fib.         Relevant Medications   amLODipine (NORVASC) 5 MG tablet   metoprolol tartrate (LOPRESSOR) 25 MG tablet   flecainide (TAMBOCOR) 100 MG tablet   Other Relevant Orders   Comprehensive metabolic panel   Hyperlipidemia due to dietary fat intake (Chronic)    Labs are being followed by PCP.  Joshua Stanton been drawn last October.  I do not have an available for virtual evaluation.  Continue atorvastatin.       Relevant Medications   amLODipine (NORVASC) 5 MG tablet   metoprolol tartrate (LOPRESSOR) 25 MG tablet   flecainide (TAMBOCOR) 100 MG tablet   Other Visit Diagnoses     Medication management       Relevant Orders   Comprehensive metabolic panel       ====================================  History of Present Illness:    Joshua Stanton is a 60 y.o. male with PMH notable for OSA/CPAP, PAT (possible short runs of Afib), frequent PVCs, HTN who presents via Engineer, civil (consulting) for a telehealth visit today as a work in visit/98-month follow-up.  Joshua Stanton was last seen on October 16, 2020.  At that time was doing relatively well.  We had him wear a monitor to valuate his tachycardia episodes.  Unfortunately, limited by resting bradycardia.  Using combination metoprolol and flecainide for breakthrough spells.  Hospitalizations:  None   Recent - Interim CV studies:   The following studies were reviewed today: Event monitor:Patch Wear Time:  13 days and 23 hours (2022-04-07T00:09:34-0400 to  2022-04-20T23:42:51-0400) Predominant rhythm is still sinus rhythm. Minimum rate 43 bpm, maximum 130 bpm with an average of 64 bpm. There were several (95 total) short burst episodes of Paroxysmal Atrial Tachycardia (PAT)/Supraventricular Tachycardia (SVT)-fastest was 5 beats at a rate at 2018 beats minute. Longest was for 25 seconds with an average rate of 122 bpm. There was one short episode of what appeared to be atrial fibrillation/flutter lasting 25 minutes with an average rate of 98 beats a minute. The range was from 69-192 bpm. There did appear to be some aberrancy in the conduction. There were 15 episodes called ventricular tachycardia which appear to be more consistent with SVT or PAT conducted with aberrancy. Rates ranged from 113 to 114 bpm. The fastest and longest interval was 10 beats. Besides these short bursts of arrhythmias,  there is really rare premature atrial or premature ventricular beats. Somnolent couplets and triplets as far as PVCs. Interestingly, symptoms were noted with normal heartbeat but normal rate. As well as some of the short fast heart rate spells.     Inerval History   Joshua Stanton is being seen today because of increasing episodes of tachycardia spells.  He called in on June 17 indicating that he had one of his worst episodes that lasted about 30 minutes with blood pressures ranging anywhere high 90s to 160s, but his BP increased to over 200 mmHg.  He had a pretty bad headache.  For this when he actually took a full dose of metoprolol and to flecainide and within 20 to 30 minutes things got better.  He has been having more episodes of late and thinks that they may be triggered by his low back pain.  This particular episode noted above was definitely associated with bad back pain.  He says he has the smaller episodes and when they do not last as long or are not as symptomatic, he tries to only take one half of the metoprolol and one of the flecainide.  Taking  the full 25 metoprolol +200 mg flecainide makes him extremely fatigued.  This is the reason why we do not have him on a standing dose of beta-blocker (also with noted resting bradycardia) or antiarrhythmic agent.  He feels fine he, dizzy with a little bit of a headache and short of breath when he has the episodes, but is not having chest pain.  He has not had any syncope near syncope, just gets very concerned.  A lot of it has to do with being anxious.  This just makes things worse.  We reviewed his monitor which was difficult to actually fully ascertain all the rhythms.  I suspect that the 95 episodes of PAT/SVT with like 5-10 beats may very well be the harbingers of A. fib, there was at least less than 1% burden of what probably was A. fib given the range of heart rates.  When he is not having the spells, he seems to doing okay.  He just lives Now in constant fear having these episodes.  But he does note that without having the back pain or even back discomfort he probably will not have an episode.  He has not had an episode in the last several months without having the back pain.  When the pain is worse, the episodes are worse.  Cardiovascular ROS: positive for - irregular heartbeat, palpitations, rapid heart rate, and episodes are associated with lightheadedness with some dizziness without near syncope, and shortness of breath, but not chest pain negative for - chest pain, dyspnea on exertion, loss of consciousness, orthopnea, paroxysmal nocturnal dyspnea, shortness of breath, or TIA/amaurosis fugax, claudication   ROS:  Please see the history of present illness.     Review of Systems  Constitutional:  Positive for malaise/fatigue (Made much worse when he has to take a full dose of metoprolol or flecainide.). Negative for weight loss.  HENT:  Negative for congestion.   Respiratory:  Negative for shortness of breath (Only with his tachycardia spells).   Cardiovascular:  Positive for palpitations  (Per HPI).  Gastrointestinal:  Negative for blood in stool and melena.  Genitourinary:  Negative for hematuria.  Musculoskeletal:  Positive for back pain (This is the most significant trigger for his episodes.) and joint pain.  Neurological:  Positive for dizziness (With tachycardia) and weakness (Sometimes  he has leg weakness because of back pain).  Psychiatric/Behavioral:  Negative for depression and memory loss. The patient is nervous/anxious (He is concerned about having more prolonged episodes.). The patient does not have insomnia.     Past Medical History:  Diagnosis Date   Arthritis    "lower back" (09/28/2015)   Atypical angina ([place]) dx'd 09/2015   GERD (gastroesophageal reflux disease)    High cholesterol    History of hiatal hernia    Hypertension    OSA on CPAP    PAF (paroxysmal atrial fibrillation) ([place]) 01/04/2021   Palpitation    last occurrence saturday 03-06-2019 , per patient , ame and went quickly, took his metoprolol at first sign of it . no reoccurence since that time    Prediabetes    Seasonal allergies    Past Surgical History:  Procedure Laterality Date   ANKLE HARDWARE REMOVAL Left 2006   left    BIOPSY  03/09/2019   Procedure: BIOPSY;  Surgeon: Wilford Corner, MD;  Location: [facility];  Service: Endoscopy;;   CARDIAC CATHETERIZATION N/A 09/28/2015   Procedure: Left Heart Cath and Coronary Angiography;  Surgeon: Wellington Hampshire, MD;  Location: [facility];  Service: Cardiovascular;: For possible atypical angina, mildly elevated troponins.  Angiographically normal coronary arteries   CARDIAC EVENT MONITOR  09/2018   Heart rate range 47 bpm - 190 bpm.  Average heart rate 70 bpm.  Low burden of PACs and PVCs.  3 short runs of SVT/PAT with aberrancy versus V. tach.  25 bursts of PAT 3 episodes of what appear to be atrial tach versus atrial fib with a rate of 190 bpm    COLONOSCOPY WITH PROPOFOL N/A 03/09/2019   Procedure: COLONOSCOPY WITH PROPOFOL;   Surgeon: Wilford Corner, MD;  Location: [facility];  Service: Endoscopy;  Laterality: N/A;   ESOPHAGOGASTRODUODENOSCOPY (EGD) WITH PROPOFOL N/A 03/09/2019   Procedure: ESOPHAGOGASTRODUODENOSCOPY (EGD) WITH PROPOFOL;  Surgeon: Wilford Corner, MD;  Location: [facility];  Service: Endoscopy;  Laterality: N/A;   FRACTURE SURGERY     KNEE ARTHROSCOPY  09/20/2011   Procedure: ARTHROSCOPY KNEE;  Surgeon: Gearlean Alf, MD;  Location: [facility];  Service: Orthopedics;  Laterality: Right;  Medial meniscal DEBRIDEMENT with chondroplasty, right knee   KNEE ARTHROSCOPY Left 04/07/2013   Procedure: LEFT KNEE ARTHROSCOPY WITH MEDIAL MENISCUS  DEBRIDEMENT ;  Surgeon: Gearlean Alf, MD;  Location: [facility];  Service: Orthopedics;  Laterality: Left;   LIPOMA EXCISION Right 2004   neck   NASAL SEPTUM SURGERY  1992   ORIF ANKLE FRACTURE Left 1981   TONSILLECTOMY  2008   TRANSTHORACIC ECHOCARDIOGRAM  09/2015   EF 60-65%. Gr 1 DD. Mod LA dilation.     UVULOPALATOPHARYNGOPLASTY  2008     Current Meds  Medication Sig   amLODipine (NORVASC) 5 MG tablet Take 1 tablet (5 mg total) by mouth daily.   aspirin EC 81 MG EC tablet Take 1 tablet (81 mg total) by mouth daily.   atorvastatin (LIPITOR) 40 MG tablet Take 1 tablet (40 mg total) by mouth daily.   gabapentin (NEURONTIN) 300 MG capsule 300 mg. Take 2 tablets ( total 600 mg )  twice a day   LORazepam (ATIVAN) 1 MG tablet Take 1 mg by mouth daily as needed for anxiety.    losartan (COZAAR) 100 MG tablet Take 100 mg daily by mouth.   Multiple Vitamin (MULTIVITAMIN WITH MINERALS) TABS tablet Take 1 tablet by mouth daily.  saw palmetto 160 MG capsule Take 160 mg by mouth 2 (two) times daily.   TESTIM 50 MG/5GM (1%) GEL Apply 5 g topically every other day.    [DISCONTINUED] flecainide (TAMBOCOR) 100 MG tablet TAKE 2 TABLETS BY MOUTH AS DIRECTED FOR RAPID HEART RATE (Patient taking differently: TAKE 1 and 1/2  TABLETS (150 mg total)  BY MOUTH  AS DIRECTED FOR RAPID HEART RATE AS NEEDED.)   [DISCONTINUED] metoprolol tartrate (LOPRESSOR) 25 MG tablet Take 25 mg by mouth daily as needed.     Allergies:   Oxycodone   Social History   Tobacco Use   Smoking status: Never   Smokeless tobacco: Former    Types: Chew    Quit date: 09/16/2001  Vaping Use   Vaping Use: Never used  Substance Use Topics   Alcohol use: Yes    Comment: 09/28/2014 "couple beers/month"   Drug use: Yes    Types: Marijuana    Comment: "quit in the 1990s"     Family Hx: The patient's family history includes Dementia in his mother; Hypertension in his mother.   Labs/Other Tests and Data Reviewed:    EKG:  No ECG reviewed.  Recent Labs: 03/25/2020: BUN 13; Creatinine, Ser 1.01; Hemoglobin 13.9; Platelets 238; Potassium 3.3; Sodium 140   Recent Lipid Panel not available. Lab Results  Component Value Date/Time   CHOL 184 09/21/2015 09:15 AM   TRIG 106 09/21/2015 09:15 AM   HDL 38 (L) 09/21/2015 09:15 AM   CHOLHDL 4.8 09/21/2015 09:15 AM   LDLCALC 125 (H) 09/21/2015 09:15 AM    Wt Readings from Last 3 Encounters:  01/04/21 256 lb (116.1 kg)  10/16/20 260 lb (117.9 kg)  05/17/20 265 lb (120.2 kg)     Objective:    Vital Signs:  BP (!) 148/72   Pulse 68   Ht 5\' 7"  (1.702 m)   Wt 256 lb (116.1 kg)   BMI 40.10 kg/m   VITAL SIGNS:  reviewed GEN:  no acute distress EYES:  EOMI RESPIRATORY:  normal respiratory effort, symmetric expansion NEURO:  alert and oriented x 3, no obvious focal deficit PSYCH:  norma mood and affect, just a bit anxious.l   ==========================================  COVID-19 Education: The signs and symptoms of COVID-19 were discussed with the patient and how to seek care for testing (follow up with PCP or arrange E-visit).   The importance of social distancing was discussed today.  Time:   Today, I have spent 26 minutes with the patient with telehealth technology discussing the above problems.   An additional 8  minutes spent charting (reviewing prior notes, hospital records, studies, labs etc.) Total 32 minutes   Medication Adjustments/Labs and Tests Ordered: Current medicines are reviewed at length with the patient today.  Concerns regarding medicines are outlined above.   Patient Instructions  Medication Instructions:   We will increase the number of metoprolol and flecainide tablets in your prescription by 20 tablets to the you do not run out. New prescription: Amlodipine 5 mg p.o. daily, dispense #90, 3 refill  *If you need a refill on your cardiac medications before your next appointment, please call your pharmacy*   Lab Work: CMP  please do before your appointment with Dr Rayann Stanton 2022  If you have labs (blood work) drawn today and your tests are completely normal, you will receive your results only by: [place] (if you have MyChart) OR A paper copy in the mail If you have any lab test that is  abnormal or we need to change your treatment, we will call you to review the results.   Testing/Procedures: Not needed   Follow-Up: At [facility], you and your health needs are our priority.  As part of our continuing mission to provide you with exceptional heart care, we have created designated Provider Care Teams.  These Care Teams include your primary Cardiologist (physician) and Advanced Practice Providers (APPs -  Physician Assistants and Nurse Practitioners) who all work together to provide you with the care you need, when you need it.    Your next appointment:   4 month(s)  The format for your next appointment:   In person or virtual  Provider:   You may see Joshua Hew, MD or one of the following Advanced Practice Providers on your designated Care Team:   Rosaria Ferries, PA-C Jory Sims, DNP, ANP   Other Instructions I look forward to seeing what the orthopedic surgeon does for your back pain.  That you are due to see Joshua Stanton in a month who can  potentially help figure out ways to treat your fast heart rate spells.   Signed, Joshua Hew, MD  01/04/2021 12:12 PM    [place]

## 2021-01-04 NOTE — Telephone Encounter (Signed)
  Patient Consent for Virtual Visit        Joshua Stanton has provided verbal consent on 01/04/2021 for a virtual visit (video or telephone).   CONSENT FOR VIRTUAL VISIT FOR:  Joshua Stanton  By participating in this virtual visit I agree to the following:  I hereby voluntarily request, consent and authorize [facility] and its employed or contracted physicians, Engineer, materials, nurse practitioners or other licensed health care professionals (the Practitioner), to provide me with telemedicine health care services (the "Services") as deemed necessary by the treating Practitioner. I acknowledge and consent to receive the Services by the Practitioner via telemedicine. I understand that the telemedicine visit will involve communicating with the Practitioner through live audiovisual communication technology and the disclosure of certain medical information by electronic transmission. I acknowledge that I have been given the opportunity to request an in-person assessment or other available alternative prior to the telemedicine visit and am voluntarily participating in the telemedicine visit.  I understand that I have the right to withhold or withdraw my consent to the use of telemedicine in the course of my care at any time, without affecting my right to future care or treatment, and that the Practitioner or I may terminate the telemedicine visit at any time. I understand that I have the right to inspect all information obtained and/or recorded in the course of the telemedicine visit and may receive copies of available information for a reasonable fee.  I understand that some of the potential risks of receiving the Services via telemedicine include:  Delay or interruption in medical evaluation due to technological equipment failure or disruption; Information transmitted may not be sufficient (e.g. poor resolution of images) to allow for appropriate medical decision making by the  Practitioner; and/or  In rare instances, security protocols could fail, causing a breach of personal health information.  Furthermore, I acknowledge that it is my responsibility to provide information about my medical history, conditions and care that is complete and accurate to the best of my ability. I acknowledge that Practitioner's advice, recommendations, and/or decision may be based on factors not within their control, such as incomplete or inaccurate data provided by me or distortions of diagnostic images or specimens that may result from electronic transmissions. I understand that the practice of medicine is not an exact science and that Practitioner makes no warranties or guarantees regarding treatment outcomes. I acknowledge that a copy of this consent can be made available to me via my patient portal ([place]), or I can request a printed copy by calling the office of [facility].    I understand that my insurance will be billed for this visit.   I have read or had this consent read to me. I understand the contents of this consent, which adequately explains the benefits and risks of the Services being provided via telemedicine.  I have been provided ample opportunity to ask questions regarding this consent and the Services and have had my questions answered to my satisfaction. I give my informed consent for the services to be provided through the use of telemedicine in my medical care

## 2021-01-05 ENCOUNTER — Institutional Professional Consult (permissible substitution): Payer: 59 | Admitting: Cardiology

## 2021-02-01 LAB — COMPREHENSIVE METABOLIC PANEL
ALT: 25 IU/L (ref 0–44)
AST: 22 IU/L (ref 0–40)
Albumin/Globulin Ratio: 2 (ref 1.2–2.2)
Albumin: 4.6 g/dL (ref 3.8–4.9)
Alkaline Phosphatase: 74 IU/L (ref 44–121)
BUN/Creatinine Ratio: 12 (ref 9–20)
BUN: 12 mg/dL (ref 6–24)
Bilirubin Total: 0.4 mg/dL (ref 0.0–1.2)
CO2: 26 mmol/L (ref 20–29)
Calcium: 8.8 mg/dL (ref 8.7–10.2)
Chloride: 104 mmol/L (ref 96–106)
Creatinine, Ser: 1.01 mg/dL (ref 0.76–1.27)
Globulin, Total: 2.3 g/dL (ref 1.5–4.5)
Glucose: 143 mg/dL — ABNORMAL HIGH (ref 65–99)
Potassium: 3.9 mmol/L (ref 3.5–5.2)
Sodium: 144 mmol/L (ref 134–144)
Total Protein: 6.9 g/dL (ref 6.0–8.5)
eGFR: 86 mL/min/{1.73_m2} (ref >59)

## 2021-02-07 ENCOUNTER — Other Ambulatory Visit: Payer: Self-pay

## 2021-02-07 ENCOUNTER — Ambulatory Visit: Payer: 59 | Admitting: Internal Medicine

## 2021-02-07 ENCOUNTER — Encounter: Payer: Self-pay | Admitting: Internal Medicine

## 2021-02-07 VITALS — BP 154/76 | HR 62 | Ht 67.0 in | Wt 257.0 lb

## 2021-02-07 DIAGNOSIS — I4719 Other supraventricular tachycardia: Secondary | ICD-10-CM

## 2021-02-07 DIAGNOSIS — I471 Supraventricular tachycardia: Secondary | ICD-10-CM

## 2021-02-07 DIAGNOSIS — R001 Bradycardia, unspecified: Secondary | ICD-10-CM | POA: Diagnosis not present

## 2021-02-07 DIAGNOSIS — I480 Paroxysmal atrial fibrillation: Secondary | ICD-10-CM

## 2021-02-07 DIAGNOSIS — G4733 Obstructive sleep apnea (adult) (pediatric): Secondary | ICD-10-CM

## 2021-02-07 MED ORDER — DILTIAZEM HCL ER COATED BEADS 120 MG PO CP24: 120.0000 mg | ORAL_CAPSULE | Freq: Every day | ORAL | 3 refills | Status: DC

## 2021-02-07 NOTE — Progress Notes (Signed)
PCP: [name], MD Primary Cardiologist: Dr [name] Primary EP: Dr Rayann Heman  Joshua Stanton is a 60 y.o. male who presents today for routine electrophysiology followup.  Since last being seen in our clinic, the patient reports doing very well.  He has frequent palpitations.  These are worse when his back pain flairs.  His Evalee Mutton is reviewed today and reveals afib as well as atrial tachycardia as the cause. Today, he denies symptoms of chest pain, shortness of breath,  lower extremity edema, dizziness, presyncope, or syncope.  The patient is otherwise without complaint today.   Past Medical History:  Diagnosis Date   Arthritis    "lower back" (09/28/2015)   Atypical angina ([place]) dx'd 09/2015   GERD (gastroesophageal reflux disease)    High cholesterol    History of hiatal hernia    Hypertension    OSA on CPAP    PAF (paroxysmal atrial fibrillation) ([place]) 01/04/2021   Palpitation    last occurrence saturday 03-06-2019 , per patient , ame and went quickly, took his metoprolol at first sign of it . no reoccurence since that time    Prediabetes    Seasonal allergies    Past Surgical History:  Procedure Laterality Date   ANKLE HARDWARE REMOVAL Left 2006   left    BIOPSY  03/09/2019   Procedure: BIOPSY;  Surgeon: Wilford Corner, MD;  [facility]: [facility];  Service: Endoscopy;;   CARDIAC CATHETERIZATION N/A 09/28/2015   Procedure: Left Heart Cath and Coronary Angiography;  Surgeon: Wellington Hampshire, MD;  Location: [place] [facility];  Service: Cardiovascular;: For possible atypical angina, mildly elevated troponins.  Angiographically normal coronary arteries   CARDIAC EVENT MONITOR  09/2018   Heart rate range 47 bpm - 190 bpm.  Average heart rate 70 bpm.  Low burden of PACs and PVCs.  3 short runs of SVT/PAT with aberrancy versus V. tach.  25 bursts of PAT 3 episodes of what appear to be atrial tach versus atrial fib with a rate of 190 bpm    COLONOSCOPY WITH PROPOFOL N/A  03/09/2019   Procedure: COLONOSCOPY WITH PROPOFOL;  Surgeon: Wilford Corner, MD;  [facility];  Service: Endoscopy;  Laterality: N/A;   ESOPHAGOGASTRODUODENOSCOPY (EGD) WITH PROPOFOL N/A 03/09/2019   Procedure: ESOPHAGOGASTRODUODENOSCOPY (EGD) WITH PROPOFOL;  Surgeon: Wilford Corner, MD;  [facility];  Service: Endoscopy;  Laterality: N/A;   FRACTURE SURGERY     KNEE ARTHROSCOPY  09/20/2011   Procedure: ARTHROSCOPY KNEE;  Surgeon: Gearlean Alf, MD;  Location: [facility];  Service: Orthopedics;  Laterality: Right;  Medial meniscal DEBRIDEMENT with chondroplasty, right knee   KNEE ARTHROSCOPY Left 04/07/2013   Procedure: LEFT KNEE ARTHROSCOPY WITH MEDIAL MENISCUS  DEBRIDEMENT ;  Surgeon: Gearlean Alf, MD;  Location: [facility];  Service: Orthopedics;  Laterality: Left;   LIPOMA EXCISION Right 2004   neck   NASAL SEPTUM SURGERY  1992   ORIF ANKLE FRACTURE Left 1981   TONSILLECTOMY  2008   TRANSTHORACIC ECHOCARDIOGRAM  09/2015   EF 60-65%. Gr 1 DD. Mod LA dilation.     UVULOPALATOPHARYNGOPLASTY  2008    ROS- all systems are reviewed and negatives except as per HPI above  Current Outpatient Medications  Medication Sig Dispense Refill   amLODipine (NORVASC) 5 MG tablet Take 1 tablet (5 mg total) by mouth daily. 90 tablet 3   aspirin EC 81 MG EC tablet Take 1 tablet (81 mg total) by mouth daily.  flecainide (TAMBOCOR) 100 MG tablet TAKE 2 TABLETS BY MOUTH AS DIRECTED FOR RAPID HEART RATE 40 tablet 6   gabapentin (NEURONTIN) 300 MG capsule 300 mg. Take 2 tablets ( total 600 mg )  twice a day     LORazepam (ATIVAN) 1 MG tablet Take 1 mg by mouth daily as needed for anxiety.      losartan (COZAAR) 100 MG tablet Take 100 mg daily by mouth.     metoprolol tartrate (LOPRESSOR) 25 MG tablet Take 1 tablet (25 mg total) by mouth daily as needed. May take an additional 25 mg  tablet if needed . 110 tablet 3   Multiple Vitamin (MULTIVITAMIN WITH MINERALS) TABS  tablet Take 1 tablet by mouth daily.     saw palmetto 160 MG capsule Take 160 mg by mouth 2 (two) times daily.     TESTIM 50 MG/5GM (1%) GEL Apply 5 g topically every other day.   5   atorvastatin (LIPITOR) 40 MG tablet Take 1 tablet (40 mg total) by mouth daily. 90 tablet 3   No current facility-administered medications for this visit.    Physical Exam: Vitals:   02/07/21 1032  BP: (!) 154/76  Pulse: 62  SpO2: 96%  Weight: 257 lb (116.6 kg)  Height: '5\' 7"'$  (1.702 m)    GEN- The patient is overweight appearing, alert and oriented x 3 today.   Head- normocephalic, atraumatic Eyes-  Sclera clear, conjunctiva pink Ears- hearing intact Oropharynx- clear Lungs- Clear to ausculation bilaterally, normal work of breathing Heart- Regular rate and rhythm, no murmurs, rubs or gallops, PMI not laterally displaced GI- soft, NT, ND, + BS Extremities- no clubbing, cyanosis, or edema  Wt Readings from Last 3 Encounters:  02/07/21 257 lb (116.6 kg)  01/04/21 256 lb (116.1 kg)  10/16/20 260 lb (117.9 kg)    EKG tracing ordered today is personally reviewed and shows sinus with diffuse chronic TWI  Echo 2017 and cath 2017 are personally reviewed today  Assessment and Plan:  Bradycardia Asymptomatic, though he is not tolerating metoprolol Will try diltiazem CD '120mg'$  daily for his palpitations (below)  2. Paroxysmal atrial fibrillation, atach, PACs Recent event monitor as well as Kard[place] tracings are personally reviewed He takes metoprolol and flecainide prn but feels fatigue with that combination. Will try low dose diltiazem. If he tolerates this, perhaps we could add flecainide '50mg'$  BID on follow-up if needed. I did offer ablation today.  He is not ready to proceed Repeat echo Could also consider tikosyn Chads2vasc score is 1.  We will not start [place] at this time.  3. OSA Compliant with CPAP  4. Obesity Body mass index is 40.25 kg/m. Lifestyle modification advised  Risks,  benefits and potential toxicities for medications prescribed and/or refilled reviewed with patient today.   Followup in AF clinic in 6 weeks  Thompson Grayer MD, [facility] 02/07/2021 10:56 AM

## 2021-02-07 NOTE — Patient Instructions (Addendum)
Medication Instructions:  Start Diltiazem 120 mg daily  Your physician recommends that you continue on your current medications as directed. Please refer to the Current Medication list given to you today.  Labwork: None ordered.  Testing/Procedures: Your physician has requested that you have an echocardiogram. Echocardiography is a painless test that uses sound waves to create images of your heart. It provides your doctor with information about the size and shape of your heart and how well your heart's chambers and valves are working. This procedure takes approximately one hour. There are no restrictions for this procedure.   Follow-Up: Your physician wants you to follow-up in: 6 weeks in the Afib Clinic.   Any Other Special Instructions Will Be Listed Below (If Applicable).  If you need a refill on your cardiac medications before your next appointment, please call your pharmacy.

## 2021-02-28 ENCOUNTER — Other Ambulatory Visit: Payer: Self-pay

## 2021-02-28 ENCOUNTER — Ambulatory Visit (HOSPITAL_COMMUNITY): Payer: 59 | Attending: Cardiology

## 2021-02-28 DIAGNOSIS — I471 Supraventricular tachycardia: Secondary | ICD-10-CM | POA: Diagnosis present

## 2021-02-28 DIAGNOSIS — G4733 Obstructive sleep apnea (adult) (pediatric): Secondary | ICD-10-CM

## 2021-02-28 DIAGNOSIS — I480 Paroxysmal atrial fibrillation: Secondary | ICD-10-CM

## 2021-02-28 DIAGNOSIS — R001 Bradycardia, unspecified: Secondary | ICD-10-CM | POA: Diagnosis present

## 2021-02-28 DIAGNOSIS — I4719 Other supraventricular tachycardia: Secondary | ICD-10-CM

## 2021-02-28 HISTORY — PX: TRANSTHORACIC ECHOCARDIOGRAM: SHX275

## 2021-02-28 LAB — ECHOCARDIOGRAM COMPLETE
Area-P 1/2: 3.85 cm2
S' Lateral: 2.9 cm

## 2021-02-28 MED ORDER — PERFLUTREN LIPID MICROSPHERE
1.0000 mL | INTRAVENOUS | Status: AC | PRN
  Administered 2021-02-28: 1 mL via INTRAVENOUS

## 2021-03-21 ENCOUNTER — Encounter (HOSPITAL_COMMUNITY): Payer: Self-pay | Admitting: Nurse Practitioner

## 2021-03-21 ENCOUNTER — Ambulatory Visit (HOSPITAL_COMMUNITY)
Admission: RE | Admit: 2021-03-21 | Discharge: 2021-03-21 | Disposition: A | Discharge status: Home / Self Care | Payer: 59 | Source: Ambulatory Visit | Attending: Nurse Practitioner | Admitting: Nurse Practitioner

## 2021-03-21 ENCOUNTER — Other Ambulatory Visit: Payer: Self-pay

## 2021-03-21 VITALS — BP 142/80 | HR 56 | Ht 67.0 in | Wt 255.0 lb

## 2021-03-21 DIAGNOSIS — Z7982 Long term (current) use of aspirin: Secondary | ICD-10-CM | POA: Insufficient documentation

## 2021-03-21 DIAGNOSIS — Z8249 Family history of ischemic heart disease and other diseases of the circulatory system: Secondary | ICD-10-CM | POA: Diagnosis not present

## 2021-03-21 DIAGNOSIS — R001 Bradycardia, unspecified: Secondary | ICD-10-CM | POA: Insufficient documentation

## 2021-03-21 DIAGNOSIS — I471 Supraventricular tachycardia: Secondary | ICD-10-CM | POA: Diagnosis not present

## 2021-03-21 DIAGNOSIS — Z7901 Long term (current) use of anticoagulants: Secondary | ICD-10-CM | POA: Diagnosis not present

## 2021-03-21 DIAGNOSIS — Z87891 Personal history of nicotine dependence: Secondary | ICD-10-CM | POA: Diagnosis not present

## 2021-03-21 DIAGNOSIS — I480 Paroxysmal atrial fibrillation: Secondary | ICD-10-CM | POA: Diagnosis present

## 2021-03-21 DIAGNOSIS — Z79899 Other long term (current) drug therapy: Secondary | ICD-10-CM | POA: Insufficient documentation

## 2021-03-21 NOTE — Progress Notes (Signed)
Primary Care Physician: [name], MD Referring Physician: Dr. Rayann Stanton Cardiologist: Joshua Stanton Reasons is a 60 y.o. male with a h/o paroxysmal afib, bradycardia, that was recently referred to Joshua Stanton from Joshua Stanton, for monitor showing  predominate sinus rhythm, bursts of PAT as well as SVT/afib. He had a minimal  heart rate of 43 bpm, maximum 130 bpm, with an average of 64 bpm.   Pt is using flecainide and metoprolol for afib episodes which responds to the drugs pretty fast. Joshua Stanton suggested to use diltiazem daily as pt  did not tolerate the metoprolol which  would make him feel very fatigued.   Pt took the diltiazem x 3 days but then had a afib episode which lasted for most of the day, so he stopped drug. He was not aware that he could still use the prn flecainide with it. He is in SR today at 56 bpm. He states that when his back pain flares he notices more heart rhythm irregularity.   Today, he denies symptoms of palpitations, chest pain, shortness of breath, orthopnea, PND, lower extremity edema, dizziness, presyncope, syncope, or neurologic sequela. The patient is tolerating medications without difficulties and is otherwise without complaint today.   Past Medical History:  Diagnosis Date   Arthritis    "lower back" (09/28/2015)   Atypical angina ([place]) dx'd 09/2015   GERD (gastroesophageal reflux disease)    High cholesterol    History of hiatal hernia    Hypertension    OSA on CPAP    PAF (paroxysmal atrial fibrillation) ([place]) 01/04/2021   Palpitation    last occurrence saturday 03-06-2019 , per patient , ame and went quickly, took his metoprolol at first sign of it . no reoccurence since that time    Prediabetes    Seasonal allergies    Past Surgical History:  Procedure Laterality Date   ANKLE HARDWARE REMOVAL Left 2006   left    BIOPSY  03/09/2019   Procedure: BIOPSY;  Surgeon: Wilford Corner, MD;  [facility];  Service:  Endoscopy;;   CARDIAC CATHETERIZATION N/A 09/28/2015   Procedure: Left Heart Cath and Coronary Angiography;  Surgeon: Wellington Hampshire, MD;  Location: [place] [facility];  Service: Cardiovascular;: For possible atypical angina, mildly elevated troponins.  Angiographically normal coronary arteries   CARDIAC EVENT MONITOR  09/2018   Heart rate range 47 bpm - 190 bpm.  Average heart rate 70 bpm.  Low burden of PACs and PVCs.  3 short runs of SVT/PAT with aberrancy versus V. tach.  25 bursts of PAT 3 episodes of what appear to be atrial tach versus atrial fib with a rate of 190 bpm    COLONOSCOPY WITH PROPOFOL N/A 03/09/2019   Procedure: COLONOSCOPY WITH PROPOFOL;  Surgeon: Wilford Corner, MD;  [facility];  Service: Endoscopy;  Laterality: N/A;   ESOPHAGOGASTRODUODENOSCOPY (EGD) WITH PROPOFOL N/A 03/09/2019   Procedure: ESOPHAGOGASTRODUODENOSCOPY (EGD) WITH PROPOFOL;  Surgeon: Wilford Corner, MD;  [facility];  Service: Endoscopy;  Laterality: N/A;   FRACTURE SURGERY     KNEE ARTHROSCOPY  09/20/2011   Procedure: ARTHROSCOPY KNEE;  Surgeon: Gearlean Alf, MD;  Location: [facility];  Service: Orthopedics;  Laterality: Right;  Medial meniscal DEBRIDEMENT with chondroplasty, right knee   KNEE ARTHROSCOPY Left 04/07/2013   Procedure: LEFT KNEE ARTHROSCOPY WITH MEDIAL MENISCUS  DEBRIDEMENT ;  Surgeon: Gearlean Alf, MD;  [facility];  Service: Orthopedics;  Laterality:  Left;   LIPOMA EXCISION Right 2004   neck   NASAL SEPTUM SURGERY  1992   ORIF ANKLE FRACTURE Left 1981   TONSILLECTOMY  2008   TRANSTHORACIC ECHOCARDIOGRAM  09/2015   EF 60-65%. Gr 1 DD. Mod LA dilation.     UVULOPALATOPHARYNGOPLASTY  2008    Current Outpatient Medications  Medication Sig Dispense Refill   aspirin EC 81 MG EC tablet Take 1 tablet (81 mg total) by mouth daily.     diltiazem (CARDIZEM CD) 120 MG 24 hr capsule Take 1 capsule (120 mg total) by mouth daily. 90 capsule 3    flecainide (TAMBOCOR) 100 MG tablet TAKE 2 TABLETS BY MOUTH AS DIRECTED FOR RAPID HEART RATE 40 tablet 6   gabapentin (NEURONTIN) 300 MG capsule 300 mg. Take 2 tablets ( total 600 mg )  twice a day     LORazepam (ATIVAN) 1 MG tablet Take 1 mg by mouth daily as needed for anxiety.      losartan (COZAAR) 100 MG tablet Take 100 mg daily by mouth.     metoprolol tartrate (LOPRESSOR) 25 MG tablet Take 1 tablet (25 mg total) by mouth daily as needed. May take an additional 25 mg  tablet if needed . 110 tablet 3   Multiple Vitamin (MULTIVITAMIN WITH MINERALS) TABS tablet Take 1 tablet by mouth daily.     saw palmetto 160 MG capsule Take 160 mg by mouth 2 (two) times daily.     TESTIM 50 MG/5GM (1%) GEL Apply 5 g topically every other day.   5   atorvastatin (LIPITOR) 40 MG tablet Take 1 tablet (40 mg total) by mouth daily. (Patient not taking: Reported on 03/21/2021) 90 tablet 3   No current facility-administered medications for this encounter.    Allergies  Allergen Reactions   Carvedilol Other (See Comments)    Makes patient feel like his moving in slow motion   Oxycodone Nausea Only    Social History   Socioeconomic History   Marital status: Married    Spouse name: Not on file   Number of children: Not on file   Years of education: Not on file   Highest education level: Not on file  Occupational History   Not on file  Tobacco Use   Smoking status: Never   Smokeless tobacco: Former    Types: Chew    Quit date: 09/16/2001   Tobacco comments:    Former [place] 03/21/2021  Vaping Use   Vaping Use: Never used  Substance and Sexual Activity   Alcohol use: Yes    Comment: 09/28/2014 "couple beers/month"   Drug use: Not Currently    Types: Marijuana    Comment: "quit in the 1990s"   Sexual activity: Yes  Other Topics Concern   Not on file  Social History Narrative   Not on file   Social Determinants of Health   Financial Resource Strain: Not on file  Food Insecurity: Not on file   Transportation Needs: Not on file  Physical Activity: Not on file  Stress: Not on file  Social Connections: Not on file  Intimate Partner Violence: Not on file    Family History  Problem Relation Age of Onset   Hypertension Mother    Dementia Mother     ROS- All systems are reviewed and negative except as per the HPI above  Physical Exam: Vitals:   03/21/21 1348  BP: (!) 142/80  Pulse: (!) 56  Weight: 115.7 kg  Height: 5'  7" (1.702 m)   Wt Readings from Last 3 Encounters:  03/21/21 115.7 kg  02/07/21 116.6 kg  01/04/21 116.1 kg    Labs: Lab Results  Component Value Date   NA 144 01/31/2021   K 3.9 01/31/2021   CL 104 01/31/2021   CO2 26 01/31/2021   GLUCOSE 143 (H) 01/31/2021   BUN 12 01/31/2021   CREATININE 1.01 01/31/2021   CALCIUM 8.8 01/31/2021   PHOS 3.4 09/21/2015   MG 2.1 09/21/2015   Lab Results  Component Value Date   INR 1.03 09/22/2015   Lab Results  Component Value Date   CHOL 184 09/21/2015   HDL 38 (L) 09/21/2015   LDLCALC 125 (H) 09/21/2015   TRIG 106 09/21/2015     GEN- The patient is well appearing, alert and oriented x 3 today.   Head- normocephalic, atraumatic Eyes-  Sclera clear, conjunctiva pink Ears- hearing intact Oropharynx- clear Neck- supple, no JVP Lymph- no cervical lymphadenopathy Lungs- Clear to ausculation bilaterally, normal work of breathing Heart- Regular rate and rhythm, no murmurs, rubs or gallops, PMI not laterally displaced GI- soft, NT, ND, + BS Extremities- no clubbing, cyanosis, or edema MS- no significant deformity or atrophy Skin- no rash or lesion Psych- euthymic mood, full affect Neuro- strength and sensation are intact  EKG-sinus brady at 56 bpm, with diffuse ST changes LHC in 2017 with normal cardiac coronary arteries   Long term monitor -Predominant rhythm is still sinus rhythm. Minimum rate 43 bpm, maximum 130 bpm with an average of 64 bpm.  There were several (95 total) short burst  episodes of Paroxysmal Atrial Tachycardia (PAT)/Supraventricular Tachycardia (SVT)-fastest was 5 beats at a rate at 2018 beats minute. Longest was for 25 seconds with an average rate of 122 bpm.  There was one short episode of what appeared to be atrial fibrillation/flutter lasting 25 minutes with an average rate of 98 beats a minute. The range was from 6992 bpm. There did appear to be some aberrancy in the conduction.  There were 15 episodes called ventricular tachycardia which appear to be more consistent with SVT or PAT conducted with aberrancy. Rates ranged from 113 to 114 bpm. The fastest and longest interval was 10 beats.  Besides these short bursts of arrhythmias, there is really rare premature atrial or premature ventricular beats. Somnolent couplets and triplets as far as PVCs.  Interestingly, symptoms were noted with normal heartbeat but normal rate. As well as some of the short fast heart rate spells.  Assessment and Plan:  1. Paroxysmal afib/atach  General discussion re afib  Pt will try diltiazem 120 mg daily again  He now knows that if he has afib that he can still use flecainide as needed Do not use the metoprolol if on daily CCB Joshua Stanton did offer pt an ablation but he is not ready for it at this time   2. CHA2DS2VASc  score of 1 He does not need anticoagulation by guidelines  3. Bradycardia Pt is not symptomatic When he starts daily CCB, I have asked him to take his BP/HR 2x a day for 4-5 days and then send thru my chart his recordings to make sure diltiazem does not cause significant slowing of HR    F/u with Joshua Stanton 10/24 and afib clinic/Joshua Stanton as needed   Joshua Stanton. Joshua Stanton, [facility] [address] [place], [place] [address] [phone]

## 2021-05-07 ENCOUNTER — Ambulatory Visit: Payer: 59 | Admitting: Cardiology

## 2021-05-07 ENCOUNTER — Encounter: Payer: Self-pay | Admitting: Cardiology

## 2021-05-07 ENCOUNTER — Other Ambulatory Visit: Payer: Self-pay

## 2021-05-07 VITALS — BP 148/90 | HR 63 | Ht 67.0 in | Wt 256.4 lb

## 2021-05-07 DIAGNOSIS — I10 Essential (primary) hypertension: Secondary | ICD-10-CM | POA: Diagnosis not present

## 2021-05-07 DIAGNOSIS — R0609 Other forms of dyspnea: Secondary | ICD-10-CM

## 2021-05-07 DIAGNOSIS — I471 Supraventricular tachycardia: Secondary | ICD-10-CM | POA: Diagnosis not present

## 2021-05-07 DIAGNOSIS — E7849 Other hyperlipidemia: Secondary | ICD-10-CM

## 2021-05-07 DIAGNOSIS — I480 Paroxysmal atrial fibrillation: Secondary | ICD-10-CM

## 2021-05-07 DIAGNOSIS — G4733 Obstructive sleep apnea (adult) (pediatric): Secondary | ICD-10-CM

## 2021-05-07 NOTE — Progress Notes (Signed)
Primary Care Provider: [name], MD Cardiologist: Glenetta Hew, MD Electrophysiologist: Thompson Grayer, MD  Clinic Note: Chief Complaint  Patient presents with   Follow-up   Tachycardia    PAF and PAT/PSVT   Atrial Fibrillation   Hypertension    ===================================  ASSESSMENT/PLAN   Problem List Items Addressed This Visit       Cardiology Problems   PAF (paroxysmal atrial fibrillation) ([place]) - Primary (Chronic)    Per Dr. Rayann Heman, with [place] Vascor of 1, holding off on full DOAC.  We will only use aspirin 81 mg. Plan now will be: Continue diltiazem 120 mg daily. Continue to use PRN flecainide-start with one half dose flecainide +1/2 dose of the previously metoprolol/Lopressor dose. => Okay to take an additional 1/2 tablet of metoprolol if BP is elevated. Also consider taking full dose of metoprolol +1/2 Tisovec and for next breakthrough spell.  At this point trying to control it with PRN flecainide and not standing doses since he is symptomatic with it.  We may just need to go to 50 twice daily standing dose of flecainide.  EP also think about a possibility Tikosyn, and has offered ablation.  He needs to continue to follow-up with EP.      Relevant Orders   EKG 12-Lead (Completed)   Essential hypertension (Chronic)    BP is high.  We had added amlodipine, but he never took it and now is on diltiazem so he can use.  I would like to convert him from losartan to valsartan. ->  Plan will be 160 mg valsartan at his next refill time for his prescription.  This allows [place] to potentially be gone to 320 mg.      PAT (paroxysmal atrial tachycardia) ([facility]) (Chronic)    I think this short bursts are probably PAT in the long bursts of PAF.  However we are treating it the same way with standing dose diltiazem and PRN flecainide plus metoprolol.      Hyperlipidemia due to dietary fat intake (Chronic)    LDL still not at goal for his weight and risk factors.   He is likely hyperglycemia with A1c of 6.1, low HDL, obesity and hypertension.  He is only on 40 mg atorvastatin. => May need to consider adjusting or changing to rosuvastatin or adding Zetia.        Other   Obstructive sleep apnea (Chronic)    Continue to stress importance of CPAP.      DOE (dyspnea on exertion) (Chronic)    Obese, deconditioned.  Has had a ischemic evaluation has been negative.  Does not have active ischemic or CHF symptoms.  Echo was pretty normal.       ===================================  HPI:    Joshua Stanton is a morbidly obese 60 y.o. male with a PMH notable for OSA-CPAP, HTN, PAT with frequent PACs and PVCs (possible runs of atrial tachycardia/fib) who presents today for close follow-up.  I last saw Joshua Stanton on January 04, 2021 via telemedicine in follow-up from his event monitor.   Event monitor showed very frequent (95) short burst runs of PAT/PSVT with the fastest being 5 beats at 218 beats a minute and longest was 25 seconds at a rate of 120 beats a minute.  1 short episode of what may be was atrial fibrillation/flutter lasting 25 minutes with a rate range of 69 192 bpm.  Also 15 short spells of V. tach versus SVT with aberrancy.  Rare PACs and  PVCs.  Some triplets.  Symptoms noted both with normal heart rhythms as well as with tachycardia spells.  => He was noticing increased frequencies of tachycardia, having called in on the 17th having a 35-minute episode with blood pressures below and high.  Also noticed the blood pressure going up above 200 range.  He actually took a full dose metoprolol and flecainide and within 20 to 30 minutes things got better.  He still thinks that these episodes are triggered by back pain but they are occurring more frequently.  Short episodes on mother much is the longer ones that last several minutes.  He is also concerned about taking the PRN medications was only taken half tablet of metoprolol and flecainide,  stating that taking the full 25 mg Toprol +2 and milligrams flecainide to make it feel extremely fatigued.-Hence the reason for not being on standing beta-blocker. Provided extra metoprolol and flecainide doses on prescription.  Added amlodipine 5 mg. Referred back to Dr. Rayann Heman  Recent Hospitalizations: None  Seen by Dr. Rayann Heman on 02/07/2021 -> he did review the [employer] strips provided that showed A. fib as well as atrial tachycardia.  Asymptomatic when not having tachycardia spells.  Also noted asymptomatic bradycardia. Converted from metoprolol to diltiazem 120 mg daily. Discussed possibility of ablation versus Tikosyn.  [place] Vascor is 1 therefore not started on DOAC.  Placed on aspirin. Recheck echo. => He called in several weeks later noting that he energy level was better on diltiazem but then felt very lightheaded and winded followed by a short bursts of tachycardia lasting 10 to 15 minutes.  Also has no chest pain.  Asked if diltiazem could cause this.==> Answer was no.  Recommended ER for chest pain if continues. 03/21/2021-A. fib clinic Roderic Palau, NP) -> noted that after having an episode of A. fib that lasted most of the day he stopped taking diltiazem not thinking that he continues PRN flecainide along with it.  Again noted that his Irregular heart rhythms usually revolve around when he has back pain flares. Restarted back on diltiazem and reassured that he can take flecainide along with it. Asked to keep a blood pressure log.   Reviewed  CV studies:    The following studies were reviewed today: (if available, images/films reviewed: From Epic Chart or Care Everywhere) TTE 02/28/2021: Mildly dilated LV.  Elevated LVEDP (indicating diastolic dysfunction, but unable to adequately assess).  EF normal 60 to 65% with no R WMA.  Normal RV function, with normal RVP => no change from 09/2015.   Interval History:   Joshua Stanton returns today for what amounts to be to be 6-week  follow-up stating that he is still having episodes of severe high blood pressure and tachycardia with spells can last 15 to 20 minutes.  They can sometimes last up to 4 to 6 hours.  He had several episodes that he shows me on his monitor where his blood pressures go up into the systolic range of 660 and heart rates also go up.  He gets very anxious and nervous when the spells happen.  Gets a little lightheaded.  He is concerned about the blood pressure as well as heart rate.  Sometimes the pressure gets that high he does have a little chest tightness but not usually.  Also denies any headache or blurred vision.  No strokelike symptoms.  CV Review of Symptoms (Summary) Cardiovascular ROS: positive for - dyspnea on exertion, irregular heartbeat, palpitations, rapid heart rate, and he is  quite deconditioned, not able to do much exercise because of his back.  Hence the exertional dyspnea.  Having more frequent irregular heart rate spells ranging from 15-20 minutes up to 4-6 hours.  Not associated with Syncope/near syncope, but does have occasional headache, And dizziness.  No real dyspnea unless it lasted longer than a few hours. negative for - chest pain, loss of consciousness, orthopnea, paroxysmal nocturnal dyspnea, shortness of breath, or syncope/near syncope or TIA/amaurosis fugax, claudication  REVIEWED OF SYSTEMS   Review of Systems  Constitutional:  Positive for malaise/fatigue. Negative for weight loss.  HENT:  Negative for congestion.   Respiratory:  Negative for cough and shortness of breath (Sometimes when he has a prolonged fast heart rate spells.).   Cardiovascular:  Positive for palpitations.       Per HPI  Gastrointestinal:  Negative for blood in stool and melena.  Genitourinary:  Negative for dysuria and hematuria.  Musculoskeletal:  Positive for back pain and joint pain.  Neurological:  Positive for dizziness, focal weakness (Associated back pain and radicular pain.) and headaches.  Negative for weakness.  Psychiatric/Behavioral:  Negative for depression and memory loss. The patient is nervous/anxious. The patient does not have insomnia.    I have reviewed and (if needed) personally updated the patient's problem list, medications, allergies, past medical and surgical history, social and family history.   PAST MEDICAL HISTORY   Past Medical History:  Diagnosis Date   Arthritis    "lower back" (09/28/2015)   Atypical angina ([place]) dx'd 09/2015   GERD (gastroesophageal reflux disease)    High cholesterol    History of hiatal hernia    Hypertension    OSA on CPAP    PAF (paroxysmal atrial fibrillation) ([place]) 01/04/2021   Palpitation    last occurrence saturday 03-06-2019 , per patient , ame and went quickly, took his metoprolol at first sign of it . no reoccurence since that time    Prediabetes    Seasonal allergies     PAST SURGICAL HISTORY   Past Surgical History:  Procedure Laterality Date   ANKLE HARDWARE REMOVAL Left 2006   left    BIOPSY  03/09/2019   Procedure: BIOPSY;  Surgeon: Wilford Corner, MD;  Location: [facility];  Service: Endoscopy;;   CARDIAC CATHETERIZATION N/A 09/28/2015   Procedure: Left Heart Cath and Coronary Angiography;  Surgeon: Wellington Hampshire, MD;  Location: [facility];  Service: Cardiovascular;: For possible atypical angina, mildly elevated troponins.  Angiographically normal coronary arteries   CARDIAC EVENT MONITOR  09/2018   Heart rate range 47 bpm - 190 bpm.  Average heart rate 70 bpm.  Low burden of PACs and PVCs.  3 short runs of SVT/PAT with aberrancy versus V. tach.  25 bursts of PAT 3 episodes of what appear to be atrial tach versus atrial fib with a rate of 190 bpm    COLONOSCOPY WITH PROPOFOL N/A 03/09/2019   Procedure: COLONOSCOPY WITH PROPOFOL;  Surgeon: Wilford Corner, MD;  Location: [facility];  Service: Endoscopy;  Laterality: N/A;   ESOPHAGOGASTRODUODENOSCOPY (EGD) WITH PROPOFOL N/A 03/09/2019    Procedure: ESOPHAGOGASTRODUODENOSCOPY (EGD) WITH PROPOFOL;  Surgeon: Wilford Corner, MD;  Location: [facility];  Service: Endoscopy;  Laterality: N/A;   FRACTURE SURGERY     KNEE ARTHROSCOPY  09/20/2011   Procedure: ARTHROSCOPY KNEE;  Surgeon: Gearlean Alf, MD;  [facility];  Service: Orthopedics;  Laterality: Right;  Medial meniscal DEBRIDEMENT with chondroplasty, right knee  KNEE ARTHROSCOPY Left 04/07/2013   Procedure: LEFT KNEE ARTHROSCOPY WITH MEDIAL MENISCUS  DEBRIDEMENT ;  Surgeon: Gearlean Alf, MD;  Location: [facility];  Service: Orthopedics;  Laterality: Left;   LIPOMA EXCISION Right 2004   neck   NASAL SEPTUM SURGERY  1992   ORIF ANKLE FRACTURE Left 1981   TONSILLECTOMY  2008   TRANSTHORACIC ECHOCARDIOGRAM  09/2015   Mildly dilated LV.  Elevated LVEDP (indicating diastolic dysfunction, but unable to adequately assess).  EF normal 60 to 65% with no R WMA.  Normal RV function, with normal RVP => no change from 09/2015.   UVULOPALATOPHARYNGOPLASTY  2008    Immunization History  Administered Date(s) Administered   Influenza-Unspecified 09/15/2018   Unspecified SARS-COV-2 Vaccination 10/27/2019, 11/17/2019    MEDICATIONS/ALLERGIES   Current Meds  Medication Sig   aspirin EC 81 MG EC tablet Take 1 tablet (81 mg total) by mouth daily.   atorvastatin (LIPITOR) 40 MG tablet Take 1 tablet (40 mg total) by mouth daily.   diltiazem (CARDIZEM CD) 120 MG 24 hr capsule Take 1 capsule (120 mg total) by mouth daily.   flecainide (TAMBOCOR) 100 MG tablet TAKE 2 TABLETS BY MOUTH AS DIRECTED FOR RAPID HEART RATE   gabapentin (NEURONTIN) 300 MG capsule 300 mg. Take 2 tablets ( total 600 mg )  twice a day   LORazepam (ATIVAN) 1 MG tablet Take 1 mg by mouth daily as needed for anxiety.    losartan (COZAAR) 100 MG tablet Take 100 mg daily by mouth.   Multiple Vitamin (MULTIVITAMIN WITH MINERALS) TABS tablet Take 1 tablet by mouth daily.   saw palmetto 160 MG  capsule Take 160 mg by mouth 2 (two) times daily.   TESTIM 50 MG/5GM (1%) GEL Apply 5 g topically every other day.     Allergies  Allergen Reactions   Carvedilol Other (See Comments)    Makes patient feel like his moving in slow motion   Oxycodone Nausea Only    SOCIAL HISTORY/FAMILY HISTORY   Reviewed in Epic:  Pertinent findings:  Social History   Tobacco Use   Smoking status: Never   Smokeless tobacco: Former    Types: Chew    Quit date: 09/16/2001   Tobacco comments:    Former [place] 03/21/2021  Vaping Use   Vaping Use: Never used  Substance Use Topics   Alcohol use: Yes    Comment: 09/28/2014 "couple beers/month"   Drug use: Not Currently    Types: Marijuana    Comment: "quit in the 1990s"   Social History   Social History Narrative   Not on file    OBJCTIVE -PE, EKG, labs   Wt Readings from Last 3 Encounters:  05/07/21 256 lb 6.4 oz (116.3 kg)  03/21/21 255 lb (115.7 kg)  02/07/21 257 lb (116.6 kg)    Physical Exam: BP (!) 148/90   Pulse 63   Ht 5\' 7"  (1.702 m)   Wt 256 lb 6.4 oz (116.3 kg)   SpO2 98%   BMI 40.16 kg/m  Physical Exam Vitals reviewed.  Constitutional:      General: He is not in acute distress.    Appearance: Normal appearance. He is not ill-appearing or toxic-appearing.     Comments: Morbidly obese.  Well-groomed.  HENT:     Head: Normocephalic and atraumatic.  Neck:     Vascular: No carotid bruit or JVD.  Cardiovascular:     Rate and Joshua: Normal rate and regular Joshua. No extrasystoles are  present.    Chest Wall: PMI is not displaced (Unable to palpate).     Pulses: Intact distal pulses.     Heart sounds: S1 normal and S2 normal. Heart sounds are distant. No murmur heard.   No friction rub. No gallop.  Pulmonary:     Effort: Pulmonary effort is normal. No respiratory distress.     Breath sounds: Normal breath sounds.  Chest:     Chest wall: No tenderness.  Musculoskeletal:        General: Swelling (Trivial) present.  Normal range of motion.     Cervical back: Normal range of motion and neck supple.  Skin:    General: Skin is warm.     Coloration: Skin is not pale.  Neurological:     General: No focal deficit present.     Mental Status: He is alert and oriented to person, place, and time.     Gait: Gait abnormal.  Psychiatric:        Mood and Affect: Mood normal.        Thought Content: Thought content normal.        Judgment: Judgment normal.     Comments: Quite anxious.    Adult ECG Report  Rate: 63 ;  Joshua: normal sinus Joshua and LVH with repolarization changes.  Otherwise normal axis, intervals durations. ;   Narrative Interpretation: Stable  Recent Labs:   12/25/2020: TC 171, TG 77, HDL 36, LDL 121.  A1c 6.1.  Hgb 13.8.Cr 1.01, K+ 3.9.  TSH 1.2. Lab Results  Component Value Date   CHOL 184 09/21/2015   HDL 38 (L) 09/21/2015   LDLCALC 125 (H) 09/21/2015   TRIG 106 09/21/2015   CHOLHDL 4.8 09/21/2015   Lab Results  Component Value Date   CREATININE 1.01 01/31/2021   BUN 12 01/31/2021   NA 144 01/31/2021   K 3.9 01/31/2021   CL 104 01/31/2021   CO2 26 01/31/2021   CBC Latest Ref Rng & Units 03/25/2020 05/14/2017 09/29/2015  WBC 4.0 - 10.5 K/uL 8.0 8.1 7.7  Hemoglobin 13.0 - 17.0 g/dL 13.9 16.1 14.2  Hematocrit 39.0 - 52.0 % 43.2 47.3 43.7  Platelets 150 - 400 K/uL 238 224 229    Lab Results  Component Value Date   HGBA1C 6.1 (H) 09/28/2015   No results found for: TSH  ==================================================  COVID-19 Education: The signs and symptoms of COVID-19 were discussed with the patient and how to seek care for testing (follow up with PCP or arrange E-visit).    I spent a total of 26 minutes with the patient spent in direct patient consultation.  Additional time spent with chart review  / charting (studies, outside notes, etc): 20 min Total Time: 46 min  Current medicines are reviewed at length with the patient today.  (+/- concerns) Still not sure  how to appropriately take flecainide and diltiazem.  This visit occurred during the SARS-CoV-2 public health emergency.  Safety protocols were in place, including screening questions prior to the visit, additional usage of staff PPE, and extensive cleaning of exam room while observing appropriate contact time as indicated for disinfecting solutions.  Notice: This dictation was prepared with Dragon dictation along with smart phrase technology. Any transcriptional errors that result from this process are unintentional and may not be corrected upon review.  Patient Instructions / Medication Changes & Studies & Tests Ordered   Patient Instructions  Medication Instructions:    New instructions   When you have  episode - heart rate and blood pressure elevates   Take !/2 tablet of Flecainide  and 1/2 tablet of Metoprolol.    You may take  another 1/2 tablet in blood pressure is still elevated  after 2- 3 hours  The next time you have an episode start off taking !/2 tablet of Flecainide and whole tablet of Metoprolol .   *If you need a refill on your cardiac medications before your next appointment, please call your pharmacy*   Lab Work:  Not needed   Testing/Procedures:  Not needed  Follow-Up: At [facility], you and your health needs are our priority.  As part of our continuing mission to provide you with exceptional heart care, we have created designated Provider Care Teams.  These Care Teams include your primary Cardiologist (physician) and Advanced Practice Providers (APPs -  Physician Assistants and Nurse Practitioners) who all work together to provide you with the care you need, when you need it.     Your next appointment:   6 month(s)  The format for your next appointment:   In Person  Provider:   Glenetta Hew, MD   Other Instructions   Your physician recommends that you schedule a follow-up appointment in: 6 weeks - virtual    Studies Ordered:   Orders Placed  This Encounter  Procedures   EKG 12-Lead     Glenetta Hew, M.D., M.S. Interventional Cardiologist   Pager # [phone] Phone # [phone] [address] [place], [place] [address]   Thank you for choosing Heartcare at [facility]!!

## 2021-05-07 NOTE — Patient Instructions (Signed)
Medication Instructions:    New instructions   When you have  episode - heart rate and blood pressure elevates   Take !/2 tablet of Flecainide  and 1/2 tablet of Metoprolol.    You may take  another 1/2 tablet in blood pressure is still elevated  after 2- 3 hours  The next time you have an episode start off taking !/2 tablet of Flecainide and whole tablet of Metoprolol .   *If you need a refill on your cardiac medications before your next appointment, please call your pharmacy*   Lab Work:  Not needed   Testing/Procedures:  Not needed  Follow-Up: At [facility], you and your health needs are our priority.  As part of our continuing mission to provide you with exceptional heart care, we have created designated Provider Care Teams.  These Care Teams include your primary Cardiologist (physician) and Advanced Practice Providers (APPs -  Physician Assistants and Nurse Practitioners) who all work together to provide you with the care you need, when you need it.     Your next appointment:   6 month(s)  The format for your next appointment:   In Person  Provider:   Glenetta Hew, MD   Other Instructions   Your physician recommends that you schedule a follow-up appointment in: 6 weeks - virtual

## 2021-05-14 ENCOUNTER — Other Ambulatory Visit: Payer: Self-pay | Admitting: Cardiology

## 2021-05-20 ENCOUNTER — Encounter: Payer: Self-pay | Admitting: Cardiology

## 2021-05-20 NOTE — Assessment & Plan Note (Signed)
LDL still not at goal for his weight and risk factors.  He is likely hyperglycemia with A1c of 6.1, low HDL, obesity and hypertension.  He is only on 40 mg atorvastatin. => May need to consider adjusting or changing to rosuvastatin or adding Zetia.

## 2021-05-20 NOTE — Assessment & Plan Note (Signed)
BP is high.  We had added amlodipine, but he never took it and now is on diltiazem so he can use.  I would like to convert him from losartan to valsartan. ->  Plan will be 160 mg valsartan at his next refill time for his prescription.  This allows [place] to potentially be gone to 320 mg.

## 2021-05-20 NOTE — Assessment & Plan Note (Signed)
Obese, deconditioned.  Has had a ischemic evaluation has been negative.  Does not have active ischemic or CHF symptoms.  Echo was pretty normal.

## 2021-05-20 NOTE — Assessment & Plan Note (Signed)
I think this short bursts are probably PAT in the long bursts of PAF.  However we are treating it the same way with standing dose diltiazem and PRN flecainide plus metoprolol.

## 2021-05-20 NOTE — Assessment & Plan Note (Signed)
Continue to stress importance of CPAP.

## 2021-05-20 NOTE — Assessment & Plan Note (Addendum)
Per Dr. Rayann Heman, with [place] Vascor of 1, holding off on full DOAC.  We will only use aspirin 81 mg. Plan now will be:  Continue diltiazem 120 mg daily.  Continue to use PRN flecainide-start with one half dose flecainide +1/2 dose of the previously metoprolol/Lopressor dose. => Okay to take an additional 1/2 tablet of metoprolol if BP is elevated.  Also consider taking full dose of metoprolol +1/2 Tisovec and for next breakthrough spell.  At this point trying to control it with PRN flecainide and not standing doses since he is symptomatic with it.  We may just need to go to 50 twice daily standing dose of flecainide.  EP also think about a possibility Tikosyn, and has offered ablation.  He needs to continue to follow-up with EP.

## 2021-06-18 ENCOUNTER — Telehealth: Payer: Self-pay | Admitting: Radiation Oncology

## 2021-06-18 NOTE — Telephone Encounter (Signed)
LVM to make consult appt with Dr. Tammi Klippel

## 2021-06-20 ENCOUNTER — Encounter: Payer: Self-pay | Admitting: Cardiology

## 2021-06-20 ENCOUNTER — Telehealth (INDEPENDENT_AMBULATORY_CARE_PROVIDER_SITE_OTHER): Payer: 59 | Admitting: Cardiology

## 2021-06-20 ENCOUNTER — Other Ambulatory Visit: Payer: Self-pay

## 2021-06-20 VITALS — BP 160/85 | HR 77 | Ht 67.0 in | Wt 249.0 lb

## 2021-06-20 DIAGNOSIS — I471 Supraventricular tachycardia: Secondary | ICD-10-CM | POA: Diagnosis not present

## 2021-06-20 DIAGNOSIS — I480 Paroxysmal atrial fibrillation: Secondary | ICD-10-CM

## 2021-06-20 DIAGNOSIS — Z01810 Encounter for preprocedural cardiovascular examination: Secondary | ICD-10-CM | POA: Diagnosis not present

## 2021-06-20 DIAGNOSIS — I10 Essential (primary) hypertension: Secondary | ICD-10-CM | POA: Diagnosis not present

## 2021-06-20 NOTE — Patient Instructions (Signed)
Medication Instructions:   Stop losartan,  Start valsartan 320 mg tablets (for first week take 1/2 tablet and monitor pressures, if still elevated, increase to full tablet) New Rx: Valsartan 320 mg tablets; take as directed; 30 tabs, 3 refills  If dose is appropriate, can then increase to 90-day supply Increase diltiazem to 2 tablets daily in the evening.  Monitor heart rate to make sure is not too slow (not less than 55 at rest) -> if after 2 weeks, heart rate and blood pressure are improved, we can increase the dose on the prescription to 240 mg daily.   *If you need a refill on your cardiac medications before your next appointment, please call your pharmacy*   Lab Work:  Chemistry panel (CMP with mag) to be ordered on the day of CVRR follow-up  If you have labs (blood work) drawn today and your tests are completely normal, you will receive your results only by: [place] (if you have MyChart) OR A paper copy in the mail If you have any lab test that is abnormal or we need to change your treatment, we will call you to review the results.   Testing/Procedures:  None   Follow-Up: At [facility], you and your health needs are our priority.  As part of our continuing mission to provide you with exceptional heart care, we have created designated Provider Care Teams.  These Care Teams include your primary Cardiologist (physician) and Advanced Practice Providers (APPs -  Physician Assistants and Nurse Practitioners) who all work together to provide you with the care you need, when you need it.  We recommend signing up for the patient portal called "MyChart".  Sign up information is provided on this After Visit Summary.  MyChart is used to connect with patients for Virtual Visits (Telemedicine).  Patients are able to view lab/test results, encounter notes, upcoming appointments, etc.  Non-urgent messages can be sent to your provider as well.   To learn more about what you can do  with MyChart, go to [email or web address].    Your next appointment:   6 week(s) -> CVRR Hypertension Clinic (run by our clinical pharmacist) 4 months-> Dr. [name]  The format for your next appointment:   In Person  Provider:   4 months with Dr. [name],     Other Instructions With Dr. Rayann Heman retiring, I would like to have you reassigned to a new electrophysiology doctor either Dr. Curt Bears or Dr. Quentin Ore -> we will try to arrange follow-up in the new year.  I recommend that you talk to Dr. Rolena Infante about potentially meeting with Dr. Nelva Bush Who Is Their Physical Medicine Rehab doctor who helps out with physical therapy and pain control.  HAPPY HOLIDAYS!!!

## 2021-06-20 NOTE — Assessment & Plan Note (Addendum)
Per EP: Decreased CHA2DS2-VASc or only 1.  Not using DOAC.  Although the more episodes he has, I more inclined to consider initiating DOAC.  Dr. Rayann Heman retiring-we will refer to Dr. Curt Bears for Dr. Quentin Ore to reassess treatment options.  Continue current plan for diltiazem standing with PRN combination flecainide plus metoprolol.  Okay to take additional half dose of flecainide if spells continue.  But also if blood pressure is high take additional one half dose of metoprolol.  Can increase his standing dose of diltiazem attempting at 240 mg, but may need to back down to 1 daily.  For now he will take 2 tablets (240 mg) daily.

## 2021-06-20 NOTE — Progress Notes (Signed)
Virtual Visit via Video Note   This visit type was conducted due to national recommendations for restrictions regarding the COVID-19 Pandemic (e.g. social distancing) in an effort to limit this patient's exposure and mitigate transmission in our community.  Due to his co-morbid illnesses, this patient is at least at moderate risk for complications without adequate follow up.  This format is felt to be most appropriate for this patient at this time.  All issues noted in this document were discussed and addressed.  A limited physical exam was performed with this format.  Please refer to the patient's chart for his consent to telehealth for [facility].    Patient has given verbal permission to conduct this visit via virtual appointment and to bill insurance 06/20/2021 3:09 PM     Evaluation Performed:  Follow-up visit  Date:  06/20/2021   ID:  Joshua Stanton, DOB 06-06-1961, MRN [id number]  Patient Location: Home Provider Location: Other:  -Hospital office  PCP:  [name], MD  Cardiologist:  Glenetta Hew, MD  Electrophysiologist:  Thompson Grayer, MD   Chief Complaint:   Chief Complaint  Patient presents with   Follow-up    BP/tachycardia follow-up-still having spikes of heart rate and blood pressure. Last month blood pressures and heart rates recorded.  See below.   Tachycardia    Has combination atrial tachycardia/atrial fibrillation.   Hypertension    Labile    ====================================  ASSESSMENT & PLAN:    Problem List Items Addressed This Visit       Cardiology Problems   PAF (paroxysmal atrial fibrillation) (HCC) (Chronic)    Per EP: Decreased CHA2DS2-VASc or only 1.  Not using DOAC.  Although the more episodes he has, I more inclined to consider initiating DOAC.  Dr. Rayann Heman retiring-we will refer to Dr. Curt Bears for Dr. Quentin Ore to reassess treatment options.  Continue current plan for diltiazem standing with PRN combination flecainide plus  metoprolol.  Okay to take additional half dose of flecainide if spells continue.  But also if blood pressure is high take additional one half dose of metoprolol.  Can increase his standing dose of diltiazem attempting at 240 mg, but may need to back down to 1 daily.  For now he will take 2 tablets (240 mg) daily.       Essential hypertension - Primary (Chronic)    Somewhat labile.  See attached chart.  Plan: Convert from losartan to valsartan 320 mg daily.  Start off for the first 1 to 2 weeks taking half tablet, if pressures still elevated, increase to full 320 mg daily. Increase diltiazem to 2 tablets daily (240 mg) and reassess.  May need to consider cutting back to 180 mg.  Instructions: Start valsartan 320 mg tablets (for first week take 1/2 tablet and monitor pressures, if still elevated, increase to full tablet) New Rx: Valsartan 320 mg tablets; take as directed; 30 tabs, 3 refills  If dose is appropriate, can then increase to 90-day supply Increase diltiazem to 2 tablets daily in the evening.  Monitor heart rate to make sure is not too slow (not less than 55 at rest) -> if after 2 weeks, heart rate and blood pressure are improved, we can increase the dose on the prescription to 240 mg daily.       PAT (paroxysmal atrial tachycardia) (HCC) (Chronic)    Similar treatment for PAF.        Other   Preop cardiovascular exam    He is thinking  that it may be a possibility that he may consider surgery for his prostate cancer that was recently diagnosed.  He has had a pretty negative ischemic evaluation and has not really having true anginal symptoms.  He does have some intermittent tachycardia spells which are being pretty well treated with current medications.  At this time I will think any further cardiac evaluation required.  He does not require stress test or other ischemic evaluation. Prostate surgery would be a low risk surgery.  Continue current meds.        ====================================  History of Present Illness:    Joshua Stanton is a 60 y.o. male with PMH notable for OSA-CPAP, HTN (labile), frequent PACs/PVCs with PAT and PAF who presents via audio-video conferencing for a telehealth visit today as a work in visit to discuss spikes of blood pressure and heart rate..  He has been seen by Dr. Rayann Heman (most recently in August 2022) from EP who indicated that on review of the  [employer] strips provided that showed A. fib as well as atrial tachycardia.  Asymptomatic when not having tachycardia spells. => He was converted from metoprolol to diltiazem.  I discussed Tikosyn versus ablation.  Because of CHA2DS2-VASc score of 1, not started on DOAC.  Recommended recheck echo-reviewed below..  Also using PRN flecainide.  Seen in follow-up in A. fib clinic 03/21/2021: -> Noted better energy with diltiazem over beta-blocker.  Still felt winded and tired with tachycardia spells.  Notes that tachycardia spells tend to occur with back pain.  He was instructed to take both diltiazem and flecainide.  Also asked to keep BP log.  Rhonin Trott Gilberti was last seen May 07, 2021 -> this was a 6-week follow-up for nursing severe high blood pressure and tachycardia spells lasting 15 to 20 minutes.  Lasting 4 to 6 hours.  Blood pressure ranges up to 200 and heart rates up into the 150s.  Can be associated with some chest discomfort.  Most time associated with back pain. Unfortunately, I had planned to switch him from losartan to valsartan, but this did not happen. Recommend he continue 120 mg diltiazem and using PRN flecainide reducing to 1/2 tablet of flecainide +1/2 tablet of metoprolol per past heart rate spells and tach and hypertension.  Told to take an additional 1/2 tablet of metoprolol if blood pressure elevated.  He did not tolerate standing dose flecainide.  Hospitalizations:  None  Recent - Interim CV studies:   The following studies  were reviewed today: TTE 02/28/2021: EF 60 to 65%.  No R WMA.  Diastolic function, but unable to quantify.  Normal RV.  Normal valves.  14-day Zio patch monitor April 2022  Predominant rhythm is still sinus rhythm. Minimum rate 43 bpm, maximum 130 bpm with an average of 64 bpm.  There were several (95 total) short burst episodes of Paroxysmal Atrial Tachycardia (PAT)/Supraventricular Tachycardia (SVT)-fastest was 5 beats at a rate at 2018 beats minute. Longest was for 25 seconds with an average rate of 122 bpm.  There was one short episode of what appeared to be atrial fibrillation/flutter lasting 25 minutes with an average rate of 98 beats a minute. The range was from 6992 bpm. There did appear to be some aberrancy in the conduction.  There were 15 episodes called ventricular tachycardia which appear to be more consistent with SVT or PAT conducted with aberrancy. Rates ranged from 113 to 114 bpm. The fastest and longest interval was 10 beats.  Besides these short  bursts of arrhythmias, there is really rare premature atrial or premature ventricular beats. Somnolent couplets and triplets as far as PVCs.  Interestingly, symptoms were noted with normal heartbeat but normal rate. As well as some of the short fast heart rate spells.    Inerval History   OWENS HARA is here via video visit today to discuss his recurrent episodes of spiking blood pressure and heart rate.  Please see his blood pressure log below.  He is very symptomatic when he feels the spells and is very concerned.  They last maybe an hour or so and they are not necessarily associated with chest pain although he can have some chest pain.  The chest pain oftentimes is not associated with tachycardia.  He says that he went the entire Thanksgiving break without an episode.  He was able to get around the house without any major issues, but did not have any significant back pain. Is going back to work having to sit down and being  uncomfortable all day, he starts having chest pain spells and that triggers his tachycardia.  He still notes the spiking of blood pressures but not as high, and still has high heart rates.  Besides just not feeling well, he denies feeling any syncope or near syncope type symptoms.  He may feel a bit of exhaustion and fatigue when he is tachycardic he has not noted chest pain or pressure.  He gets short of breath.  He otherwise doing relatively well.  He is concerned about his blood pressures but there is significant swelling of BP levels.  Cardiovascular ROS: positive for - chest pain, irregular heartbeat, palpitations, rapid heart rate, and only notes shortness of breath with these episodes.  Does not have chest pain with tachycardia, has random episodes of chest pain. negative for - edema, orthopnea, paroxysmal nocturnal dyspnea, shortness of breath, or syncope or near syncope, TIA or amaurosis fugax.  Claudication   Recently diagnosed with prostate cancer, stage I  ROS:  Please see the history of present illness.     Review of Systems  Constitutional:  Positive for malaise/fatigue (During his episodes). Negative for weight loss.  Gastrointestinal:  Negative for blood in stool and melena.  Genitourinary:  Negative for dysuria and hematuria.  Musculoskeletal:  Positive for back pain.  Neurological:  Positive for dizziness (If his heart rate gets fast) and tingling (Radiculopathy, back pain). Negative for focal weakness and weakness.  Psychiatric/Behavioral:  Negative for depression and memory loss. The patient is nervous/anxious. The patient does not have insomnia.    Past Medical History:  Diagnosis Date   Arthritis    "lower back" (09/28/2015)   Atypical angina ([place]) dx'd 09/2015   GERD (gastroesophageal reflux disease)    High cholesterol    History of hiatal hernia    Hypertension    OSA on CPAP    PAF (paroxysmal atrial fibrillation) ([place]) 01/04/2021   Palpitation    last  occurrence saturday 03-06-2019 , per patient , ame and went quickly, took his metoprolol at first sign of it . no reoccurence since that time    Prediabetes    Seasonal allergies    Past Surgical History:  Procedure Laterality Date   ANKLE HARDWARE REMOVAL Left 2006   left    BIOPSY  03/09/2019   Procedure: BIOPSY;  Surgeon: Wilford Corner, MD;  Location: [facility];  Service: Endoscopy;;   CARDIAC CATHETERIZATION N/A 09/28/2015   Procedure: Left Heart Cath and Coronary Angiography;  Surgeon:  Wellington Hampshire, MD;  Location: [facility];  Service: Cardiovascular;: For possible atypical angina, mildly elevated troponins.  Angiographically normal coronary arteries   CARDIAC EVENT MONITOR  09/2018   Heart rate range 47 bpm - 190 bpm.  Average heart rate 70 bpm.  Low burden of PACs and PVCs.  3 short runs of SVT/PAT with aberrancy versus V. tach.  25 bursts of PAT 3 episodes of what appear to be atrial tach versus atrial fib with a rate of 190 bpm    COLONOSCOPY WITH PROPOFOL N/A 03/09/2019   Procedure: COLONOSCOPY WITH PROPOFOL;  Surgeon: Wilford Corner, MD;  Location: [facility];  Service: Endoscopy;  Laterality: N/A;   ESOPHAGOGASTRODUODENOSCOPY (EGD) WITH PROPOFOL N/A 03/09/2019   Procedure: ESOPHAGOGASTRODUODENOSCOPY (EGD) WITH PROPOFOL;  Surgeon: Wilford Corner, MD;  Location: [facility];  Service: Endoscopy;  Laterality: N/A;   FRACTURE SURGERY     KNEE ARTHROSCOPY  09/20/2011   Procedure: ARTHROSCOPY KNEE;  Surgeon: Gearlean Alf, MD;  Location[facility];  Service: Orthopedics;  Laterality: Right;  Medial meniscal DEBRIDEMENT with chondroplasty, right knee   KNEE ARTHROSCOPY Left 04/07/2013   Procedure: LEFT KNEE ARTHROSCOPY WITH MEDIAL MENISCUS  DEBRIDEMENT ;  Surgeon: Gearlean Alf, MD;  Location: [facility];  Service: Orthopedics;  Laterality: Left;   LIPOMA EXCISION Right 2004   neck   NASAL SEPTUM SURGERY  1992   ORIF ANKLE FRACTURE Left 1981    TONSILLECTOMY  2008   TRANSTHORACIC ECHOCARDIOGRAM  02/28/2021   EF 60 to 65%.  No R WMA.  Diastolic function, but unable to quantify.  Normal RV.  Normal valves.   UVULOPALATOPHARYNGOPLASTY  2008     Current Meds  Medication Sig   aspirin EC 81 MG EC tablet Take 1 tablet (81 mg total) by mouth daily.   atorvastatin (LIPITOR) 40 MG tablet TAKE 1 TABLET(40 MG) BY MOUTH DAILY   diltiazem (CARDIZEM CD) 120 MG 24 hr capsule Take 1 capsule (120 mg total) by mouth daily.   flecainide (TAMBOCOR) 100 MG tablet TAKE 2 TABLETS BY MOUTH AS DIRECTED FOR RAPID HEART RATE   gabapentin (NEURONTIN) 300 MG capsule 300 mg as needed. Take 2 tablets ( total 600 mg )  twice a day   LORazepam (ATIVAN) 1 MG tablet Take 1 mg by mouth daily as needed for anxiety.    losartan (COZAAR) 100 MG tablet Take 100 mg daily by mouth.   metoprolol tartrate (LOPRESSOR) 25 MG tablet Take 1 tablet (25 mg total) by mouth daily as needed. May take an additional 25 mg  tablet if needed .   Multiple Vitamin (MULTIVITAMIN WITH MINERALS) TABS tablet Take 1 tablet by mouth daily.   saw palmetto 160 MG capsule Take 160 mg by mouth 2 (two) times daily.     Allergies:   Carvedilol and Oxycodone   Social History   Tobacco Use   Smoking status: Never   Smokeless tobacco: Former    Types: Chew    Quit date: 09/16/2001   Tobacco comments:    Former [place] 03/21/2021  Vaping Use   Vaping Use: Never used  Substance Use Topics   Alcohol use: Yes    Comment: 09/28/2014 "couple beers/month"   Drug use: Not Currently    Types: Marijuana    Comment: "quit in the 1990s"     Family Hx: The patient's family history includes Dementia in his mother; Hypertension in his mother.   Labs/Other Tests and Data Reviewed:  EKG:  No ECG reviewed.  Recent Labs: 01/31/2021: ALT 25; BUN 12; Creatinine, Ser 1.01; Potassium 3.9; Sodium 144   Recent Lipid Panel Lab Results  Component Value Date/Time   CHOL 184 09/21/2015 09:15 AM   TRIG  106 09/21/2015 09:15 AM   HDL 38 (L) 09/21/2015 09:15 AM   CHOLHDL 4.8 09/21/2015 09:15 AM   LDLCALC 125 (H) 09/21/2015 09:15 AM    Wt Readings from Last 3 Encounters:  06/20/21 249 lb (112.9 kg)  05/07/21 256 lb 6.4 oz (116.3 kg)  03/21/21 255 lb (115.7 kg)     Objective:      Vital Signs:  BP (!) 160/85   Pulse 77   Ht 5\' 7"  (1.702 m)   Wt 249 lb (112.9 kg)   BMI 39.00 kg/m   VITAL SIGNS:  reviewed GEN:  no acute distress RESPIRATORY:  normal respiratory effort, symmetric expansion NEURO:  alert and oriented x 3, no obvious focal deficit PSYCH:  -Anxious but normal mood   ==========================================  COVID-19 Education: The signs and symptoms of COVID-19 were discussed with the patient and how to seek care for testing (follow up with PCP or arrange E-visit).   The importance of social distancing was discussed today.  Time:   Today, I have spent 30 minutes with the patient with telehealth technology discussing the above problems.   An additional 33minutes spent charting (reviewing prior notes, hospital records, studies, labs etc.) Total 25minutes   Medication Adjustments/Labs and Tests Ordered: Current medicines are reviewed at length with the patient today.  Concerns regarding medicines are outlined above.   Patient Instructions  Medication Instructions:   Stop losartan,  Start valsartan 320 mg tablets (for first week take 1/2 tablet and monitor pressures, if still elevated, increase to full tablet) New Rx: Valsartan 320 mg tablets; take as directed; 30 tabs, 3 refills  If dose is appropriate, can then increase to 90-day supply Increase diltiazem to 2 tablets daily in the evening.  Monitor heart rate to make sure is not too slow (not less than 55 at rest) -> if after 2 weeks, heart rate and blood pressure are improved, we can increase the dose on the prescription to 240 mg daily.   *If you need a refill on your cardiac medications before your  next appointment, please call your pharmacy*   Lab Work:  Chemistry panel (CMP with mag) to be ordered on the day of CV[facility] follow-up  If you have labs (blood work) drawn today and your tests are completely normal, you will receive your results only by: [place] (if you have MyChart) OR A paper copy in the mail If you have any lab test that is abnormal or we need to change your treatment, we will call you to review the results.   Testing/Procedures:  None   Follow-Up: At [facility], you and your health needs are our priority.  As part of our continuing mission to provide you with exceptional heart care, we have created designated Provider Care Teams.  These Care Teams include your primary Cardiologist (physician) and Advanced Practice Providers (APPs -  Physician Assistants and Nurse Practitioners) who all work together to provide you with the care you need, when you need it.  We recommend signing up for the patient portal called "MyChart".  Sign up information is provided on this After Visit Summary.  MyChart is used to connect with patients for Virtual Visits (Telemedicine).  Patients are able to view lab/test results, encounter notes,  upcoming appointments, etc.  Non-urgent messages can be sent to your provider as well.   To learn more about what you can do with MyChart, go to [email or web address].    Your next appointment:   6 week(s) -> [facility] Hypertension Clinic (run by our clinical pharmacist) 4 months-> Dr. [name]  The format for your next appointment:   In Person  Provider:   4 months with Dr. [name],     Other Instructions With Dr. Rayann Heman retiring, I would like to have you reassigned to a new electrophysiology doctor either Dr. Curt Bears or Dr. Quentin Ore -> we will try to arrange follow-up in the new year.  I recommend that you talk to Dr. Rolena Infante about potentially meeting with Dr. Nelva Bush Who Is Their Physical Medicine Rehab doctor who helps out with physical  therapy and pain control.  HAPPY HOLIDAYS!!!   Signed, Glenetta Hew, MD  06/20/2021 3:09 PM    [place]

## 2021-06-20 NOTE — Assessment & Plan Note (Addendum)
Somewhat labile.  See attached chart.  Plan: Convert from losartan to valsartan 320 mg daily.  Start off for the first 1 to 2 weeks taking half tablet, if pressures still elevated, increase to full 320 mg daily. Increase diltiazem to 2 tablets daily (240 mg) and reassess.  May need to consider cutting back to 180 mg.  Instructions: . Start valsartan 320 mg tablets (for first week take 1/2 tablet and monitor pressures, if still elevated, increase to full tablet) o New Rx: Valsartan 320 mg tablets; take as directed; 30 tabs, 3 refills  o If dose is appropriate, can then increase to 90-day supply . Increase diltiazem to 2 tablets daily in the evening.  Monitor heart rate to make sure is not too slow (not less than 55 at rest) -> if after 2 weeks, heart rate and blood pressure are improved, we can increase the dose on the prescription to 240 mg daily.

## 2021-06-20 NOTE — Assessment & Plan Note (Signed)
He is thinking that it may be a possibility that he may consider surgery for his prostate cancer that was recently diagnosed.  He has had a pretty negative ischemic evaluation and has not really having true anginal symptoms.  He does have some intermittent tachycardia spells which are being pretty well treated with current medications.  At this time I will think any further cardiac evaluation required.  He does not require stress test or other ischemic evaluation. Prostate surgery would be a low risk surgery.  Continue current meds.

## 2021-06-20 NOTE — Assessment & Plan Note (Signed)
Similar treatment for PAF.

## 2021-06-21 ENCOUNTER — Telehealth: Payer: Self-pay | Admitting: *Deleted

## 2021-06-21 MED ORDER — VALSARTAN 320 MG PO TABS: 320.0000 mg | ORAL_TABLET | Freq: Every day | ORAL | 4 refills | Status: DC

## 2021-06-21 MED ORDER — DILTIAZEM HCL ER COATED BEADS 120 MG PO CP24
240.0000 mg | ORAL_CAPSULE | Freq: Every day | ORAL | 3 refills | Status: DC
Start: 2021-06-21 — End: 2021-09-12

## 2021-06-21 NOTE — Telephone Encounter (Signed)
RN spoke to patient. Instruction were given  from today's virtual visit 06/20/21 .  AVS SUMMARY has been sent by mychart .  Patient aware new prescription was sent to pharmacy - valsartan 320 mg  Patient will call back when he needs 240 mg diltiazem capsules. Patient states he just filled  the 120 mg  bottle.  Appointment were schedule for for follow up with [facility]- Hypertension clinic jan 16 - with labs - CMP/wit mag same day Follow up appointment in April with Dr [name].    Patient verbalized understanding

## 2021-06-21 NOTE — Addendum Note (Signed)
Addended by: Raiford Simmonds on: 06/21/2021 04:45 PM   Modules accepted: Orders

## 2021-06-26 ENCOUNTER — Ambulatory Visit
Admission: RE | Admit: 2021-06-26 | Discharge: 2021-06-26 | Disposition: A | Discharge status: Home / Self Care | Payer: 59 | Source: Ambulatory Visit | Attending: Radiation Oncology | Admitting: Radiation Oncology

## 2021-06-26 ENCOUNTER — Other Ambulatory Visit: Payer: Self-pay

## 2021-06-26 VITALS — BP 151/73 | HR 70 | Temp 97.9°F | Resp 20 | Ht 67.0 in | Wt 257.4 lb

## 2021-06-26 DIAGNOSIS — Z7982 Long term (current) use of aspirin: Secondary | ICD-10-CM | POA: Insufficient documentation

## 2021-06-26 DIAGNOSIS — I480 Paroxysmal atrial fibrillation: Secondary | ICD-10-CM | POA: Insufficient documentation

## 2021-06-26 DIAGNOSIS — K219 Gastro-esophageal reflux disease without esophagitis: Secondary | ICD-10-CM | POA: Diagnosis not present

## 2021-06-26 DIAGNOSIS — I10 Essential (primary) hypertension: Secondary | ICD-10-CM | POA: Diagnosis not present

## 2021-06-26 DIAGNOSIS — M129 Arthropathy, unspecified: Secondary | ICD-10-CM | POA: Insufficient documentation

## 2021-06-26 DIAGNOSIS — Z79899 Other long term (current) drug therapy: Secondary | ICD-10-CM | POA: Insufficient documentation

## 2021-06-26 DIAGNOSIS — C61 Malignant neoplasm of prostate: Secondary | ICD-10-CM | POA: Insufficient documentation

## 2021-06-26 DIAGNOSIS — G4700 Insomnia, unspecified: Secondary | ICD-10-CM | POA: Diagnosis not present

## 2021-06-26 NOTE — Progress Notes (Signed)
GU Location of Tumor / Histology: Prostate Ca  If Prostate Cancer, Gleason Score is (4 + 3) and PSA is (4.38 as of 03/06/21)  Biopsies  Dr. Abner Greenspan          Past/Anticipated interventions by urology, if any:   Past/Anticipated interventions by medical oncology, if any:   Weight changes, if any:  Lost 10-12 pounds over one year period.  IPSS:  13 SHIM:  23  Bowel/Bladder complaints, if any:  No bowel or bladder issues since biopsy.  Nausea/Vomiting, if any:  Occasional when get back pain feels nauseated.  Pain issues, if any:  0/10  SAFETY ISSUES: Prior radiation?  No Pacemaker/ICD?  No Possible current pregnancy?   Male Is the patient on methotrexate?  No  Current Complaints / other details:  Need more information on treatment options.

## 2021-06-26 NOTE — Progress Notes (Signed)
Radiation Oncology         [phone] ________________________________  Initial Outpatient Consultation  Name: Joshua Stanton MRN: [id number]  Date: 06/26/2021  DOB: 31-Mar-1961  BW:LSLHTD, Gwyndolyn Saxon, MD  Janith Lima, MD   REFERRING PHYSICIAN: Janith Lima, MD  DIAGNOSIS: 60 y.o. gentleman with Stage T1c adenocarcinoma of the prostate with Gleason score of 3+4, and PSA of 4.38.    ICD-10-CM   1. Malignant neoplasm of prostate ([place])  C61       HISTORY OF PRESENT ILLNESS: Joshua Stanton is a 60 y.o. male with a diagnosis of prostate cancer. He was noted to have an elevated PSA of 4.06 by his primary care physician, Dr. Kenton Kingfisher.  Accordingly, he was referred for evaluation in urology by Dr. Abner Greenspan on 03/07/21,  digital rectal examination was performed at that time revealing no nodules. Repeat PSA that day showed stable persistent elevation at 4.38. The patient proceeded to transrectal ultrasound with 12 biopsies of the prostate on 05/01/21.  The prostate volume measured 32 cc.  Out of 12 core biopsies, 4 were positive, all on the right.  The maximum Gleason score was 3+4, and this was seen in the right base (with perineural invasion), right mid, right base lateral, and right mid lateral.  The patient reviewed the biopsy results with his urologist and he has kindly been referred today for discussion of potential radiation treatment options.   PREVIOUS RADIATION THERAPY: No  PAST MEDICAL HISTORY:  Past Medical History:  Diagnosis Date   Arthritis    "lower back" (09/28/2015)   Atypical angina ([place]) dx'd 09/2015   GERD (gastroesophageal reflux disease)    High cholesterol    History of hiatal hernia    Hypertension    OSA on CPAP    PAF (paroxysmal atrial fibrillation) ([place]) 01/04/2021   Palpitation    last occurrence saturday 03-06-2019 , per patient , ame and went quickly, took his metoprolol at first sign of it . no reoccurence since that time    Prediabetes     Seasonal allergies       PAST SURGICAL HISTORY: Past Surgical History:  Procedure Laterality Date   ANKLE HARDWARE REMOVAL Left 2006   left    BIOPSY  03/09/2019   Procedure: BIOPSY;  Surgeon: Wilford Corner, MD;  Location: [facility];  Service: Endoscopy;;   CARDIAC CATHETERIZATION N/A 09/28/2015   Procedure: Left Heart Cath and Coronary Angiography;  Surgeon: Wellington Hampshire, MD;  Location: [facility];  Service: Cardiovascular;: For possible atypical angina, mildly elevated troponins.  Angiographically normal coronary arteries   CARDIAC EVENT MONITOR  09/2018   Heart rate range 47 bpm - 190 bpm.  Average heart rate 70 bpm.  Low burden of PACs and PVCs.  3 short runs of SVT/PAT with aberrancy versus V. tach.  25 bursts of PAT 3 episodes of what appear to be atrial tach versus atrial fib with a rate of 190 bpm    COLONOSCOPY WITH PROPOFOL N/A 03/09/2019   Procedure: COLONOSCOPY WITH PROPOFOL;  Surgeon: Wilford Corner, MD;  Location: [facility];  Service: Endoscopy;  Laterality: N/A;   ESOPHAGOGASTRODUODENOSCOPY (EGD) WITH PROPOFOL N/A 03/09/2019   Procedure: ESOPHAGOGASTRODUODENOSCOPY (EGD) WITH PROPOFOL;  Surgeon: Wilford Corner, MD;  Location: [facility];  Service: Endoscopy;  Laterality: N/A;   FRACTURE SURGERY     KNEE ARTHROSCOPY  09/20/2011   Procedure: ARTHROSCOPY KNEE;  Surgeon: Gearlean Alf, MD;  Location: [facility];  Service:  Orthopedics;  Laterality: Right;  Medial meniscal DEBRIDEMENT with chondroplasty, right knee   KNEE ARTHROSCOPY Left 04/07/2013   Procedure: LEFT KNEE ARTHROSCOPY WITH MEDIAL MENISCUS  DEBRIDEMENT ;  Surgeon: Gearlean Alf, MD;  Location: [facility];  Service: Orthopedics;  Laterality: Left;   LIPOMA EXCISION Right 2004   neck   NASAL SEPTUM SURGERY  1992   ORIF ANKLE FRACTURE Left 1981   TONSILLECTOMY  2008   TRANSTHORACIC ECHOCARDIOGRAM  02/28/2021   EF 60 to 65%.  No R WMA.  Diastolic function, but unable to  quantify.  Normal RV.  Normal valves.   UVULOPALATOPHARYNGOPLASTY  2008    FAMILY HISTORY:  Family History  Problem Relation Age of Onset   Hypertension Mother    Dementia Mother     SOCIAL HISTORY:  Social History   Socioeconomic History   Marital status: Married    Spouse name: Not on file   Number of children: Not on file   Years of education: Not on file   Highest education level: Not on file  Occupational History   Not on file  Tobacco Use   Smoking status: Never   Smokeless tobacco: Former    Types: Chew    Quit date: 09/16/2001   Tobacco comments:    Former [place] 03/21/2021  Vaping Use   Vaping Use: Never used  Substance and Sexual Activity   Alcohol use: Yes    Comment: 09/28/2014 "couple beers/month"   Drug use: Not Currently    Types: Marijuana    Comment: "quit in the 1990s"   Sexual activity: Yes  Other Topics Concern   Not on file  Social History Narrative   Not on file   Social Determinants of Health   Financial Resource Strain: Not on file  Food Insecurity: Not on file  Transportation Needs: Not on file  Physical Activity: Not on file  Stress: Not on file  Social Connections: Not on file  Intimate Partner Violence: Not on file    ALLERGIES: Carvedilol, Hydrocodone-acetaminophen, Oxycodone, and Trazodone hcl  MEDICATIONS:  Current Outpatient Medications  Medication Sig Dispense Refill   aspirin 81 MG chewable tablet 1 tablet     aspirin EC 81 MG EC tablet Take 1 tablet (81 mg total) by mouth daily.     atorvastatin (LIPITOR) 40 MG tablet TAKE 1 TABLET(40 MG) BY MOUTH DAILY 90 tablet 3   diltiazem (CARDIZEM CD) 120 MG 24 hr capsule Take 2 capsules (240 mg total) by mouth daily. Call the office if tolerating medication will change to 240 mg tablet daily 60 capsule 3   flecainide (TAMBOCOR) 100 MG tablet TAKE 2 TABLETS BY MOUTH AS DIRECTED FOR RAPID HEART RATE 40 tablet 6   gabapentin (NEURONTIN) 300 MG capsule 300 mg as needed. Take 2 tablets (  total 600 mg )  twice a day     LORazepam (ATIVAN) 1 MG tablet Take 1 mg by mouth daily as needed for anxiety.      losartan (COZAAR) 100 MG tablet Take 1 tablet by mouth at bedtime.     metoprolol tartrate (LOPRESSOR) 25 MG tablet Take 1 tablet (25 mg total) by mouth daily as needed. May take an additional 25 mg  tablet if needed . 110 tablet 3   Multiple Vitamin (MULTIVITAMIN WITH MINERALS) TABS tablet Take 1 tablet by mouth daily.     mupirocin cream (BACTROBAN) 2 % 1 application to affected area small amount (Patient not taking: Reported on 06/26/2021)  pantoprazole (PROTONIX) 40 MG tablet 1 tablet (Patient not taking: Reported on 06/26/2021)     Probiotic Product (PROBIOTIC GUMMIES PO) (2 gummies) (Patient not taking: Reported on 06/26/2021)     saw palmetto 160 MG capsule Take 160 mg by mouth 2 (two) times daily. (Patient not taking: Reported on 06/26/2021)     valsartan (DIOVAN) 320 MG tablet Take 1 tablet (320 mg total) by mouth daily. (Patient not taking: Reported on 06/26/2021) 30 tablet 4   No current facility-administered medications for this encounter.    REVIEW OF SYSTEMS:  On review of systems, the patient reports that he is doing well overall. He denies any chest pain, shortness of breath, cough, fevers, chills, night sweats, unintended weight changes. He denies any bowel disturbances, and denies abdominal pain, nausea or vomiting. He denies any new musculoskeletal or joint aches or pains. His IPSS was 13, indicating mild-moderate urinary symptoms. His SHIM was 23, indicating he does not have erectile dysfunction. A complete review of systems is obtained and is otherwise negative.    PHYSICAL EXAM:  Wt Readings from Last 3 Encounters:  06/26/21 257 lb 6.4 oz (116.8 kg)  06/20/21 249 lb (112.9 kg)  05/07/21 256 lb 6.4 oz (116.3 kg)   Temp Readings from Last 3 Encounters:  06/26/21 97.9 F (36.6 C)  05/17/20 98.3 F (36.8 C) (Oral)  03/25/20 98.5 F (36.9 C) (Oral)    BP Readings from Last 3 Encounters:  06/26/21 (!) 151/73  06/20/21 (!) 160/85  05/07/21 (!) 148/90   Pulse Readings from Last 3 Encounters:  06/26/21 70  06/20/21 77  05/07/21 63   Pain Assessment Pain Score: 0-No pain/10  In general this is a well appearing African American male in no acute distress. He's alert and oriented x4 and appropriate throughout the examination. Cardiopulmonary assessment is negative for acute distress, and he exhibits normal effort.     KPS = 90  100 - Normal; no complaints; no evidence of disease. 90   - Able to carry on normal activity; minor signs or symptoms of disease. 80   - Normal activity with effort; some signs or symptoms of disease. 64   - Cares for self; unable to carry on normal activity or to do active work. 60   - Requires occasional assistance, but is able to care for most of his personal needs. 50   - Requires considerable assistance and frequent medical care. 77   - Disabled; requires special care and assistance. 83   - Severely disabled; hospital admission is indicated although death not imminent. 49   - Very sick; hospital admission necessary; active supportive treatment necessary. 10   - Moribund; fatal processes progressing rapidly. 0     - Dead  Karnofsky DA, Abelmann [place], Craver LS and Burchenal [facility] [phone]) The use of the nitrogen mustards in the palliative treatment of carcinoma: with particular reference to bronchogenic carcinoma Cancer 1 634-56  LABORATORY DATA:  Lab Results  Component Value Date   WBC 8.0 03/25/2020   HGB 13.9 03/25/2020   HCT 43.2 03/25/2020   MCV 88.7 03/25/2020   PLT 238 03/25/2020   Lab Results  Component Value Date   NA 144 01/31/2021   K 3.9 01/31/2021   CL 104 01/31/2021   CO2 26 01/31/2021   Lab Results  Component Value Date   ALT 25 01/31/2021   AST 22 01/31/2021   ALKPHOS 74 01/31/2021   BILITOT 0.4 01/31/2021     RADIOGRAPHY: No results  found.    IMPRESSION/PLAN: 1. 60 y.o.  gentleman with Stage T1c adenocarcinoma of the prostate with Gleason Score of 3+4, and PSA of 4.38. We discussed the patient's workup and outlined the nature of prostate cancer in this setting. The patient's T stage, Gleason's score, and PSA put him into the favorable intermediate risk group. Accordingly, he is eligible for a variety of potential treatment options including brachytherapy, 5.5 weeks of external radiation, or prostatectomy. We discussed the available radiation techniques, and focused on the details and logistics of delivery. We discussed and outlined the risks, benefits, short and long-term effects associated with radiotherapy and compared and contrasted these with prostatectomy. We discussed the role of SpaceOAR gel in reducing the rectal toxicity associated with radiotherapy.   Today I reviewed the findings and workup thus far.  We discussed the natural history of prostate cancer.  We reviewed the the implications of T-stage, Gleason's Score, and PSA on decision-making and outcomes related to prostate cancer.  We discussed radiation treatment in the management of prostate cancer with regard to the logistics and delivery of external beam radiation treatment as well as the logistics and delivery of prostate brachytherapy.  We compared and contrasted each of these approaches and also compared these against prostatectomy.    The patient focused most of his questions and interest in robotic-assisted laparoscopic radical prostatectomy.  We discussed some of the potential advantages of surgery including surgical staging, the availability of salvage radiotherapy to the prostatic fossa, and the confidence associated with immediate biochemical response.  We discussed some of the potential proven indications for postoperative radiotherapy including positive margins, extracapsular extension, and seminal vesicle involvement. We also talked about some of the other potential findings leading to a  recommendation for radiotherapy including a non-zero postoperative PSA and positive lymph nodes. He appears to have a good understanding of his disease and our treatment recommendations which are of curative intent.  He was encouraged to ask questions that were answered to his stated satisfaction.  At the conclusion of our conversation, the patient is still undecided but leaning towards prostatectomy with plans to keep his scheduled consultation with Dr. Alinda Money on 07/17/2021 to discuss this further and confirm his decision.  We will share our discussion with Dr. Abner Greenspan.  We enjoyed meeting with him today, and look forward to following his progress.  He knows that he is welcome to call anytime in the interim with any questions or concerns related to radiotherapy.  We personally spent 70 minutes in this encounter including chart review, reviewing radiological studies, meeting face-to-face with the patient, entering orders and completing documentation.    Nicholos Johns, PA-C    Tyler Pita, MD  [facility] Direct Dial: [phone]  Fax: [phone] [email or web address]  Skype  LinkedIn   This document serves as a record of services personally performed by Tyler Pita, MD and Freeman Caldron, PA-C. It was created on their behalf by Wilburn Mylar, a trained medical scribe. The creation of this record is based on the scribe's personal observations and the provider's statements to them. This document has been checked and approved by the attending provider.

## 2021-06-26 NOTE — Progress Notes (Signed)
Introduced myself to patient as the prostate nurse navigator and discussed my role.  No barriers to care identified at this time.  He is here to discuss his radiation treatment options.  I gave him my business card and asked him to call me with questions or concerns.  Verbalized understanding.   

## 2021-07-19 ENCOUNTER — Encounter: Payer: Self-pay | Admitting: Urology

## 2021-07-24 NOTE — Progress Notes (Signed)
Per communication with our prostate nurse navigator, Katheren Puller, RN, he has elected to proceed with RALP.  Nicholos Johns, MMS, PA-C [facility] [place]: [phone]   Fax: [phone]

## 2021-07-26 ENCOUNTER — Other Ambulatory Visit: Payer: Self-pay | Admitting: Urology

## 2021-07-30 ENCOUNTER — Ambulatory Visit: Payer: 59

## 2021-07-30 ENCOUNTER — Other Ambulatory Visit: Payer: Self-pay | Admitting: Pharmacist Clinician (PhC)/ Clinical Pharmacy Specialist

## 2021-07-30 DIAGNOSIS — I480 Paroxysmal atrial fibrillation: Secondary | ICD-10-CM

## 2021-07-30 DIAGNOSIS — I10 Essential (primary) hypertension: Secondary | ICD-10-CM

## 2021-07-30 DIAGNOSIS — I471 Supraventricular tachycardia: Secondary | ICD-10-CM

## 2021-07-30 NOTE — Progress Notes (Deleted)
07/30/2021 Joshua Stanton [facility] Aug 02, 1960 [id number]   HPI:  Joshua Stanton is a 60 y.o. male patient of Dr [name], with a PMH below who presents today for hypertension clinic evaluation.  Patient was last seen via virtual visit in December by Dr. [name].  At that time his home BP was noted to be 160/85.  Dr. [name] switched his losartan to valsartan 160 x 1-2 weeks then 320 mg daily.    Past Medical History:  Paroxysmal AF CHADS2-VASc score 1 (htn), not currently on DOAC; on diltiazem, flecainide   Hyperlipidemia  12/22 LDL 66 -  on atorvastatin 40 mg  OSA   Prostate cancer         Blood Pressure Goal:  130/80  Current Medications: valsartan 320 mg qd, diltiazem 240 mg qd,   Family Hx:  Social Hx:   Diet:   Exercise:   Home BP readings:   Intolerances:   Labs:    Wt Readings from Last 3 Encounters:  06/26/21 257 lb 6.4 oz (116.8 kg)  06/20/21 249 lb (112.9 kg)  05/07/21 256 lb 6.4 oz (116.3 kg)   BP Readings from Last 3 Encounters:  06/26/21 (!) 151/73  06/20/21 (!) 160/85  05/07/21 (!) 148/90   Pulse Readings from Last 3 Encounters:  06/26/21 70  06/20/21 77  05/07/21 63    Current Outpatient Medications  Medication Sig Dispense Refill   aspirin 81 MG chewable tablet 1 tablet     aspirin EC 81 MG EC tablet Take 1 tablet (81 mg total) by mouth daily.     atorvastatin (LIPITOR) 40 MG tablet TAKE 1 TABLET(40 MG) BY MOUTH DAILY 90 tablet 3   diltiazem (CARDIZEM CD) 120 MG 24 hr capsule Take 2 capsules (240 mg total) by mouth daily. Call the office if tolerating medication will change to 240 mg tablet daily 60 capsule 3   flecainide (TAMBOCOR) 100 MG tablet TAKE 2 TABLETS BY MOUTH AS DIRECTED FOR RAPID HEART RATE 40 tablet 6   gabapentin (NEURONTIN) 300 MG capsule 300 mg as needed. Take 2 tablets ( total 600 mg )  twice a day     LORazepam (ATIVAN) 1 MG tablet Take 1 mg by mouth daily as needed for anxiety.      losartan (COZAAR) 100 MG tablet  Take 1 tablet by mouth at bedtime.     metoprolol tartrate (LOPRESSOR) 25 MG tablet Take 1 tablet (25 mg total) by mouth daily as needed. May take an additional 25 mg  tablet if needed . 110 tablet 3   Multiple Vitamin (MULTIVITAMIN WITH MINERALS) TABS tablet Take 1 tablet by mouth daily.     mupirocin cream (BACTROBAN) 2 % 1 application to affected area small amount (Patient not taking: Reported on 06/26/2021)     pantoprazole (PROTONIX) 40 MG tablet 1 tablet (Patient not taking: Reported on 06/26/2021)     Probiotic Product (PROBIOTIC GUMMIES PO) (2 gummies) (Patient not taking: Reported on 06/26/2021)     saw palmetto 160 MG capsule Take 160 mg by mouth 2 (two) times daily. (Patient not taking: Reported on 06/26/2021)     valsartan (DIOVAN) 320 MG tablet Take 1 tablet (320 mg total) by mouth daily. (Patient not taking: Reported on 06/26/2021) 30 tablet 4   No current facility-administered medications for this visit.    Allergies  Allergen Reactions   Carvedilol Other (See Comments)    Makes patient feel like his moving in slow motion   Hydrocodone-Acetaminophen  Other reaction(s): feels like a hang over   Oxycodone Nausea Only   Trazodone Hcl     Other reaction(s): priapism    Past Medical History:  Diagnosis Date   Arthritis    "lower back" (09/28/2015)   Atypical angina ([place]) dx'd 09/2015   GERD (gastroesophageal reflux disease)    High cholesterol    History of hiatal hernia    Hypertension    OSA on CPAP    PAF (paroxysmal atrial fibrillation) ([place]) 01/04/2021   Palpitation    last occurrence saturday 03-06-2019 , per patient , ame and went quickly, took his metoprolol at first sign of it . no reoccurence since that time    Prediabetes    Seasonal allergies     There were no vitals taken for this visit.  No problem-specific Assessment & Plan notes found for this encounter.   Tommy Medal PharmD CPP [facility] [address]  [place], [place] [address] [phone]

## 2021-08-09 ENCOUNTER — Encounter: Payer: Self-pay | Admitting: Cardiology

## 2021-08-09 ENCOUNTER — Ambulatory Visit: Payer: 59 | Admitting: Cardiology

## 2021-08-09 ENCOUNTER — Other Ambulatory Visit: Payer: Self-pay

## 2021-08-09 VITALS — BP 150/82 | HR 65 | Ht 67.0 in | Wt 264.8 lb

## 2021-08-09 DIAGNOSIS — I480 Paroxysmal atrial fibrillation: Secondary | ICD-10-CM | POA: Diagnosis not present

## 2021-08-09 DIAGNOSIS — I10 Essential (primary) hypertension: Secondary | ICD-10-CM | POA: Diagnosis not present

## 2021-08-09 DIAGNOSIS — G4733 Obstructive sleep apnea (adult) (pediatric): Secondary | ICD-10-CM | POA: Diagnosis not present

## 2021-08-09 DIAGNOSIS — D6869 Other thrombophilia: Secondary | ICD-10-CM | POA: Diagnosis not present

## 2021-08-09 NOTE — Progress Notes (Signed)
Electrophysiology Office Note   Date:  08/09/2021   ID:  Joshua Stanton, DOB 06-11-1961, MRN [id number]  PCP:  [name], MD  Cardiologist:  [name] Primary Electrophysiologist:  [name] [name], MD    Chief Complaint: AF   History of Present Illness: Joshua Stanton is a 61 y.o. male who is being seen today for the evaluation of AF at the request of [name], MD. Presenting today for electrophysiology evaluation.  He has a history significant for hypertension, obstructive sleep apnea on CPAP, paroxysmal atrial fibrillation, prediabetes.  He has a cardia mobile app that has shown episodes of both atrial fibrillation and atrial tachycardia.  He feels well when he is not having his tachycardic spells.  He is currently on diltiazem and as needed flecainide.  Today, he denies symptoms of palpitations, chest pain, shortness of breath, orthopnea, PND, lower extremity edema, claudication, dizziness, presyncope, syncope, bleeding, or neurologic sequela. The patient is tolerating medications without difficulties.  He continues to have intermittent palpitations, but they are better now than they were a few months ago.  He is currently on flecainide and metoprolol which he takes on a as needed basis when he has palpitations.  His palpitations appear to occur when he has back pain.  He also has fatigue but he feels that this is due to his testosterone levels.  He has had to stop taking supplementation as he has stage I prostate cancer.  He is scheduled for prostatectomy in February.   Past Medical History:  Diagnosis Date   Arthritis    "lower back" (09/28/2015)   Atypical angina ([place]) dx'd 09/2015   GERD (gastroesophageal reflux disease)    High cholesterol    History of hiatal hernia    Hypertension    OSA on CPAP    PAF (paroxysmal atrial fibrillation) ([place]) 01/04/2021   Palpitation    last occurrence saturday 03-06-2019 , per patient , ame and went quickly, took  his metoprolol at first sign of it . no reoccurence since that time    Prediabetes    Seasonal allergies    Past Surgical History:  Procedure Laterality Date   ANKLE HARDWARE REMOVAL Left 2006   left    BIOPSY  03/09/2019   Procedure: BIOPSY;  Surgeon: Wilford Corner, MD;  Location: [facility];  Service: Endoscopy;;   CARDIAC CATHETERIZATION N/A 09/28/2015   Procedure: Left Heart Cath and Coronary Angiography;  Surgeon: Wellington Hampshire, MD;  Location: [facility];  Service: Cardiovascular;: For possible atypical angina, mildly elevated troponins.  Angiographically normal coronary arteries   CARDIAC EVENT MONITOR  09/2018   Heart rate range 47 bpm - 190 bpm.  Average heart rate 70 bpm.  Low burden of PACs and PVCs.  3 short runs of SVT/PAT with aberrancy versus V. tach.  25 bursts of PAT 3 episodes of what appear to be atrial tach versus atrial fib with a rate of 190 bpm    COLONOSCOPY WITH PROPOFOL N/A 03/09/2019   Procedure: COLONOSCOPY WITH PROPOFOL;  Surgeon: Wilford Corner, MD;  Location: [facility];  Service: Endoscopy;  Laterality: N/A;   ESOPHAGOGASTRODUODENOSCOPY (EGD) WITH PROPOFOL N/A 03/09/2019   Procedure: ESOPHAGOGASTRODUODENOSCOPY (EGD) WITH PROPOFOL;  Surgeon: Wilford Corner, MD;  Location: [facility];  Service: Endoscopy;  Laterality: N/A;   FRACTURE SURGERY     KNEE ARTHROSCOPY  09/20/2011   Procedure: ARTHROSCOPY KNEE;  Surgeon: Gearlean Alf, MD;  Location: [facility];  Service: Orthopedics;  Laterality: Right;  Medial meniscal DEBRIDEMENT with chondroplasty, right knee   KNEE ARTHROSCOPY Left 04/07/2013   Procedure: LEFT KNEE ARTHROSCOPY WITH MEDIAL MENISCUS  DEBRIDEMENT ;  Surgeon: Gearlean Alf, MD;  Location: [facility];  Service: Orthopedics;  Laterality: Left;   LIPOMA EXCISION Right 2004   neck   NASAL SEPTUM SURGERY  1992   ORIF ANKLE FRACTURE Left 1981   TONSILLECTOMY  2008   TRANSTHORACIC ECHOCARDIOGRAM  02/28/2021   EF  60 to 65%.  No R WMA.  Diastolic function, but unable to quantify.  Normal RV.  Normal valves.   UVULOPALATOPHARYNGOPLASTY  2008     Current Outpatient Medications  Medication Sig Dispense Refill   aspirin EC 81 MG EC tablet Take 1 tablet (81 mg total) by mouth daily.     atorvastatin (LIPITOR) 40 MG tablet TAKE 1 TABLET(40 MG) BY MOUTH DAILY (Patient taking differently: Take 40 mg by mouth every other day.) 90 tablet 3   diltiazem (CARDIZEM CD) 120 MG 24 hr capsule Take 2 capsules (240 mg total) by mouth daily. Call the office if tolerating medication Bonita Brindisi change to 240 mg tablet daily 60 capsule 3   flecainide (TAMBOCOR) 100 MG tablet TAKE 2 TABLETS BY MOUTH AS DIRECTED FOR RAPID HEART RATE 40 tablet 6   gabapentin (NEURONTIN) 300 MG capsule 300 mg as needed. Take 2 tablets ( total 600 mg )  twice a day     LORazepam (ATIVAN) 1 MG tablet Take 1 mg by mouth daily as needed for anxiety.      metoprolol tartrate (LOPRESSOR) 25 MG tablet Take 1 tablet (25 mg total) by mouth daily as needed. May take an additional 25 mg  tablet if needed . 110 tablet 3   Multiple Vitamin (MULTIVITAMIN WITH MINERALS) TABS tablet Take 1 tablet by mouth daily.     mupirocin cream (BACTROBAN) 2 %      pantoprazole (PROTONIX) 40 MG tablet Take 40 mg by mouth daily as needed (as directed).     Probiotic Product (PROBIOTIC GUMMIES PO) Take 1 capsule by mouth daily.     valsartan (DIOVAN) 320 MG tablet Take 1 tablet (320 mg total) by mouth daily. 30 tablet 4   losartan (COZAAR) 100 MG tablet Take 1 tablet by mouth at bedtime. (Patient not taking: Reported on 08/09/2021)     saw palmetto 160 MG capsule Take 160 mg by mouth 2 (two) times daily. (Patient not taking: Reported on 06/26/2021)     No current facility-administered medications for this visit.    Allergies:   Carvedilol, Hydrocodone-acetaminophen, Oxycodone, and Trazodone hcl   Social History:  The patient  reports that he has never smoked. He quit smokeless  tobacco use about 19 years ago.  His smokeless tobacco use included chew. He reports current alcohol use. He reports that he does not currently use drugs after having used the following drugs: Marijuana.   Family History:  The patient's family history includes Dementia in his mother; Hypertension in his mother.    ROS:  Please see the history of present illness.   Otherwise, review of systems is positive for none.   All other systems are reviewed and negative.    PHYSICAL EXAM: VS:  BP (!) 150/82    Pulse 65    Ht 5\' 7"  (1.702 m)    Wt 264 lb 12.8 oz (120.1 kg)    SpO2 98%    BMI 41.47 kg/m  , BMI Body mass index is 41.47  kg/m. GEN: Well nourished, well developed, in no acute distress  HEENT: normal  Neck: no JVD, carotid bruits, or masses Cardiac: RRR; no murmurs, rubs, or gallops,no edema  Respiratory:  clear to auscultation bilaterally, normal work of breathing GI: soft, nontender, nondistended, + BS MS: no deformity or atrophy  Skin: warm and dry Neuro:  Strength and sensation are intact Psych: euthymic mood, full affect  EKG:  EKG is ordered today. Personal review of the ekg ordered shows sinus rhythm, rate 65, T wave inversions consistent with LVH  Recent Labs: 01/31/2021: ALT 25; BUN 12; Creatinine, Ser 1.01; Potassium 3.9; Sodium 144    Lipid Panel     Component Value Date/Time   CHOL 184 09/21/2015 0915   TRIG 106 09/21/2015 0915   HDL 38 (L) 09/21/2015 0915   CHOLHDL 4.8 09/21/2015 0915   VLDL 21 09/21/2015 0915   LDLCALC 125 (H) 09/21/2015 0915     Wt Readings from Last 3 Encounters:  08/09/21 264 lb 12.8 oz (120.1 kg)  06/26/21 257 lb 6.4 oz (116.8 kg)  06/20/21 249 lb (112.9 kg)      Other studies Reviewed: Additional studies/ records that were reviewed today include: TTE 02/28/21  Review of the above records today demonstrates:   1. Left ventricular ejection fraction, by estimation, is 60 to 65%. The  left ventricle has normal function. The left  ventricle has no regional  wall motion abnormalities. The left ventricular internal cavity size was  mildly dilated. Left ventricular  diastolic parameters are indeterminate. Elevated left ventricular  end-diastolic pressure.   2. Right ventricular systolic function is normal. The right ventricular  size is normal. Tricuspid regurgitation signal is inadequate for assessing  PA pressure.   3. The mitral valve is normal in structure. Mild mitral valve  regurgitation. No evidence of mitral stenosis.   4. The aortic valve is normal in structure. Aortic valve regurgitation is  not visualized. No aortic stenosis is present.   5. Aortic dilatation noted. There is mild dilatation of the ascending  aorta, measuring 40 mm.   6. The inferior vena cava is normal in size with greater than 50%    ASSESSMENT AND PLAN:  1.  Paroxysmal atrial fibrillation/atrial tachycardia: Currently on diltiazem 240 mg daily.  He also takes flecainide 100 mg as needed along with 25 mg of metoprolol.  He continues to have short episodes, mainly associated with back pain.  He is working on getting his back pain better controlled.  He would like to give this some time to see if he continues to have episodes.  I Drevon Plog see him back again in 6 months for further discussions of therapy.  2.  Secondary hypercoagulable state: CHA2DS2-VASc of 1.  Currently not anticoagulated for atrial fibrillation as above.  3.  Hypertension: Blood pressure is elevated today.  Plan per primary cardiology.  4.  Obstructive sleep apnea: CPAP compliance encouraged  Current medicines are reviewed at length with the patient today.   The patient does not have concerns regarding his medicines.  The following changes were made today:  none  Labs/ tests ordered today include:  Orders Placed This Encounter  Procedures   EKG 12-Lead     Disposition:   FU with Avea Mcgowen 6 months  Signed, Jerris Keltz [name], MD  08/09/2021 9:40 AM     [place] [place] [address] [phone] (office) [phone] (fax)

## 2021-08-15 ENCOUNTER — Encounter: Payer: Self-pay | Admitting: Cardiology

## 2021-08-15 NOTE — Telephone Encounter (Signed)
RN spoke to patient .  Per Dr [name] , it would be okay and recommend to get oral treatment for  Covid.  RN informed patient to contact primary for further instruction .  Continue to to be quarantine for at least 5 days , if still has symptoms after 5 days quarantine for an additional 5 days. Patient voice understanding. Patient states he will call Dr Lina Sar office tomorrow.

## 2021-08-17 ENCOUNTER — Ambulatory Visit: Payer: 59

## 2021-08-23 ENCOUNTER — Encounter (HOSPITAL_COMMUNITY): Admission: RE | Admit: 2021-08-23 | Payer: 59 | Source: Ambulatory Visit

## 2021-08-23 ENCOUNTER — Other Ambulatory Visit (HOSPITAL_COMMUNITY): Payer: 59

## 2021-08-24 NOTE — Patient Instructions (Addendum)
DUE TO COVID-19 ONLY ONE VISITOR IS ALLOWED TO COME WITH YOU AND STAY IN THE WAITING ROOM ONLY DURING PRE OP AND PROCEDURE DAY OF SURGERY IF YOU ARE GOING HOME AFTER SURGERY. IF YOU ARE SPENDING THE NIGHT 2 PEOPLE MAY VISIT WITH YOU IN YOUR PRIVATE ROOM AFTER SURGERY UNTIL VISITING  HOURS ARE OVER AT 8:00 PM AND 1  VISITOR  MAY  SPEND THE NIGHT.                 Joshua Stanton     Your procedure is scheduled on: 08/30/21   Report to [facility] Main  Entrance   Report to short stay at 5:15 AM     Call this number if you have problems the morning of surgery [phone]    Follow all instructions for diet and bowel prep from the Dr's office  Have clear liquids the day before surgery.  Nothing by mouth after midnight.    CLEAR LIQUID DIET   Foods Allowed                                                                     Foods Excluded  Coffee and tea, regular and decaf                             liquids that you cannot  Plain Jell-O any favor except red or purple                                           see through such as: Fruit ices (not with fruit pulp)                                     milk, soups, orange juice  Iced Popsicles                                    All solid food Carbonated beverages, regular and diet                                    Cranberry, grape and apple juices Sports drinks like Gatorade Lightly seasoned clear broth or consume(fat free) Sugar    BRUSH YOUR TEETH MORNING OF SURGERY AND RINSE YOUR MOUTH OUT, NO CHEWING GUM CANDY OR MINTS.      Take these medicines the morning of surgery with A SIP OF WATER: Flecainide, Gabapentin, Metoprolol,Pantoprazole  Stop taking ___ASA 81 mg ________on __________as instructed by _____________.  Contact your Surgeon/Cardiologist for instructions on Anticoagulant Therapy prior to surgery. Call Dr. Melody Haver may not have any metal on your body including  piercings  Do not wear jewelry,  lotions, powders or deodorant              Men may shave face and neck.   Do not bring valuables to the hospital. [place].  Contacts, dentures or bridgework may not be worn into surgery.  Leave suitcase in the car. After surgery it may be brought to your room.   Bring your mask and tubing              [facility] - Preparing for Surgery Before surgery, you can play an important role.  Because skin is not sterile, your skin needs to be as free of germs as possible.  You can reduce the number of germs on your skin by washing with CHG (chlorahexidine gluconate) soap before surgery.  CHG is an antiseptic cleaner which kills germs and bonds with the skin to continue killing germs even after washing. Please DO NOT use if you have an allergy to CHG or antibacterial soaps.  If your skin becomes reddened/irritated stop using the CHG and inform your nurse when you arrive at Short Stay.  You may shave your face/neck. Please follow these instructions carefully:  1.  Shower with CHG Soap the night before surgery and the  morning of Surgery.  2.  If you choose to wash your hair, wash your hair first as usual with your  normal  shampoo.  3.  After you shampoo, rinse your hair and body thoroughly to remove the  shampoo.                            4.  Use CHG as you would any other liquid soap.  You can apply chg directly  to the skin and wash                       Gently with a scrungie or clean washcloth.  5.  Apply the CHG Soap to your body ONLY FROM THE NECK DOWN.   Do not use on face/ open                           Wound or open sores. Avoid contact with eyes, ears mouth and genitals (private parts).                       Wash face,  Genitals (private parts) with your normal soap.             6.  Wash thoroughly, paying special attention to the area where your surgery  will be performed.  7.  Thoroughly  rinse your body with warm water from the neck down.  8.  DO NOT shower/wash with your normal soap after using and rinsing off  the CHG Soap.                9.  Pat yourself dry with a clean towel.            10.  Wear clean pajamas.            11.  Place clean sheets on your bed the night of your first shower and do not  sleep with pets. Day of Surgery : Do not apply any lotions/deodorants the morning of  surgery.  Please wear clean clothes to the hospital/surgery center.  FAILURE TO FOLLOW THESE INSTRUCTIONS MAY RESULT IN THE CANCELLATION OF YOUR SURGERY   ________________________________________________________________________   Incentive Spirometer  An incentive spirometer is a tool that can help keep your lungs clear and active. This tool measures how well you are filling your lungs with each breath. Taking long deep breaths may help reverse or decrease the chance of developing breathing (pulmonary) problems (especially infection) following: A long period of time when you are unable to move or be active. BEFORE THE PROCEDURE  If the spirometer includes an indicator to show your best effort, your nurse or respiratory therapist will set it to a desired goal. If possible, sit up straight or lean slightly forward. Try not to slouch. Hold the incentive spirometer in an upright position. INSTRUCTIONS FOR USE  Sit on the edge of your bed if possible, or sit up as far as you can in bed or on a chair. Hold the incentive spirometer in an upright position. Breathe out normally. Place the mouthpiece in your mouth and seal your lips tightly around it. Breathe in slowly and as deeply as possible, raising the piston or the ball toward the top of the column. Hold your breath for 3-5 seconds or for as long as possible. Allow the piston or ball to fall to the bottom of the column. Remove the mouthpiece from your mouth and breathe out normally. Rest for a few seconds and repeat Steps 1 through 7 at  least 10 times every 1-2 hours when you are awake. Take your time and take a few normal breaths between deep breaths. The spirometer may include an indicator to show your best effort. Use the indicator as a goal to work toward during each repetition. After each set of 10 deep breaths, practice coughing to be sure your lungs are clear. If you have an incision (the cut made at the time of surgery), support your incision when coughing by placing a pillow or rolled up towels firmly against it. Once you are able to get out of bed, walk around indoors and cough well. You may stop using the incentive spirometer when instructed by your caregiver.  RISKS AND COMPLICATIONS Take your time so you do not get dizzy or light-headed. If you are in pain, you may need to take or ask for pain medication before doing incentive spirometry. It is harder to take a deep breath if you are having pain. AFTER USE Rest and breathe slowly and easily. It can be helpful to keep track of a log of your progress. Your caregiver can provide you with a simple table to help with this. If you are using the spirometer at home, follow these instructions: [place] IF:  You are having difficultly using the spirometer. You have trouble using the spirometer as often as instructed. Your pain medication is not giving enough relief while using the spirometer. You develop fever of 100.5 F (38.1 C) or higher. SEEK IMMEDIATE MEDICAL CARE IF:  You cough up bloody sputum that had not been present before. You develop fever of 102 F (38.9 C) or greater. You develop worsening pain at or near the incision site. MAKE SURE YOU:  Understand these instructions. Will watch your condition. Will get help right away if you are not doing well or get worse. Document Released: 11/11/2006 Document Revised: 09/23/2011 Document Reviewed: 01/12/2007 [facility] Patient Information 2014 [place],  [place].   ________________________________________________________________________

## 2021-08-27 ENCOUNTER — Other Ambulatory Visit: Payer: Self-pay

## 2021-08-27 ENCOUNTER — Encounter (HOSPITAL_COMMUNITY): Payer: Self-pay

## 2021-08-27 ENCOUNTER — Encounter (HOSPITAL_COMMUNITY)
Admission: RE | Admit: 2021-08-27 | Discharge: 2021-08-27 | Disposition: A | Discharge status: Home / Self Care | Payer: 59 | Source: Ambulatory Visit | Attending: Urology | Admitting: Urology

## 2021-08-27 VITALS — BP 163/90 | HR 64 | Temp 98.1°F | Resp 18 | Ht 67.0 in | Wt 258.0 lb

## 2021-08-27 DIAGNOSIS — K219 Gastro-esophageal reflux disease without esophagitis: Secondary | ICD-10-CM | POA: Diagnosis not present

## 2021-08-27 DIAGNOSIS — G4733 Obstructive sleep apnea (adult) (pediatric): Secondary | ICD-10-CM | POA: Insufficient documentation

## 2021-08-27 DIAGNOSIS — I480 Paroxysmal atrial fibrillation: Secondary | ICD-10-CM | POA: Diagnosis not present

## 2021-08-27 DIAGNOSIS — Z9989 Dependence on other enabling machines and devices: Secondary | ICD-10-CM | POA: Diagnosis not present

## 2021-08-27 DIAGNOSIS — R7303 Prediabetes: Secondary | ICD-10-CM | POA: Insufficient documentation

## 2021-08-27 DIAGNOSIS — I10 Essential (primary) hypertension: Secondary | ICD-10-CM | POA: Insufficient documentation

## 2021-08-27 DIAGNOSIS — Z01812 Encounter for preprocedural laboratory examination: Secondary | ICD-10-CM | POA: Insufficient documentation

## 2021-08-27 DIAGNOSIS — C61 Malignant neoplasm of prostate: Secondary | ICD-10-CM | POA: Insufficient documentation

## 2021-08-27 DIAGNOSIS — Z01818 Encounter for other preprocedural examination: Secondary | ICD-10-CM

## 2021-08-27 LAB — BASIC METABOLIC PANEL
Anion gap: 4 — ABNORMAL LOW (ref 5–15)
BUN: 14 mg/dL (ref 6–20)
CO2: 30 mmol/L (ref 22–32)
Calcium: 8.6 mg/dL — ABNORMAL LOW (ref 8.9–10.3)
Chloride: 106 mmol/L (ref 98–111)
Creatinine, Ser: 1.09 mg/dL (ref 0.61–1.24)
GFR, Estimated: >60 mL/min (ref >60)
Glucose, Bld: 71 mg/dL (ref 70–99)
Potassium: 3.5 mmol/L (ref 3.5–5.1)
Sodium: 140 mmol/L (ref 135–145)

## 2021-08-27 LAB — HEMOGLOBIN A1C
Hgb A1c MFr Bld: 6.1 % — ABNORMAL HIGH (ref 4.8–5.6)
Mean Plasma Glucose: 128.37 mg/dL

## 2021-08-27 LAB — CBC
HCT: 42 % (ref 39.0–52.0)
Hemoglobin: 13.8 g/dL (ref 13.0–17.0)
MCH: 29.2 pg (ref 26.0–34.0)
MCHC: 32.9 g/dL (ref 30.0–36.0)
MCV: 89 fL (ref 80.0–100.0)
Platelets: 336 10*3/uL (ref 150–400)
RBC: 4.72 MIL/uL (ref 4.22–5.81)
RDW: 12.4 % (ref 11.5–15.5)
WBC: 10 10*3/uL (ref 4.0–10.5)
nRBC: 0 % (ref 0.0–0.2)

## 2021-08-27 LAB — GLUCOSE, CAPILLARY: Glucose-Capillary: 71 mg/dL (ref 70–99)

## 2021-08-27 NOTE — Progress Notes (Signed)
COVID test- NA 90 day policy. Pt had Covid 08/15/21   Bowel prep reminder:yes  PCP - Dr. Delfina Redwood over the last few months. Before that he saw only Dr. Kenton Kingfisher Cardiologist - Dr. Roni Bread  Chest x-ray - 03/25/20-epic EKG - 08/09/21-epic Stress Test - 2017 ECHO - 02/28/21-epic Cardiac Cath - 09/28/2015-epic Pacemaker/ICD device last checked:NA  Sleep Study - yes CPAP - yes  Fasting Blood Sugar - doesn't test- Prediabetic Checks Blood Sugar _____ times a day  Blood Thinner Instructions:ASA 81 mg Aspirin Instructions:none Pt will look in paperwork and call Dr. [name] Last Dose:  Anesthesia review: yes  Patient denies shortness of breath, fever, cough and chest pain at PAT appointment Pt has no SOB climbing stairs doing housework or with ADLs. He has a BMI of 40.4. He has been down lately because this is the one year anniversary of a close friend dying of cancer. Patient verbalized understanding of instructions that were given to them at the PAT appointment. Patient was also instructed that they will need to review over the PAT instructions again at home before surgery. yes

## 2021-08-28 ENCOUNTER — Encounter (HOSPITAL_COMMUNITY)
Admission: RE | Admit: 2021-08-28 | Discharge: 2021-08-28 | Disposition: A | Discharge status: Home / Self Care | Payer: 59 | Source: Ambulatory Visit | Attending: Urology | Admitting: Urology

## 2021-08-28 ENCOUNTER — Encounter (HOSPITAL_COMMUNITY): Payer: 59

## 2021-08-28 DIAGNOSIS — Z20822 Contact with and (suspected) exposure to covid-19: Secondary | ICD-10-CM | POA: Diagnosis not present

## 2021-08-28 DIAGNOSIS — G4733 Obstructive sleep apnea (adult) (pediatric): Secondary | ICD-10-CM | POA: Diagnosis not present

## 2021-08-28 DIAGNOSIS — Z01812 Encounter for preprocedural laboratory examination: Secondary | ICD-10-CM | POA: Insufficient documentation

## 2021-08-28 LAB — SARS CORONAVIRUS 2 BY RT PCR (HOSPITAL ORDER, PERFORMED IN ~~LOC~~ HOSPITAL LAB): SARS Coronavirus 2: NEGATIVE

## 2021-08-28 NOTE — Progress Notes (Signed)
Anesthesia Chart Review   Case: [id number] Date/Time: 08/30/21 0700   Procedures:      XI ROBOTIC ASSISTED LAPAROSCOPIC RADICAL PROSTATECTOMY LEVEL 3     LYMPHADENECTOMY, PELVIC (Bilateral)   Anesthesia type: General   Pre-op diagnosis: PROSTATE CANCER   Location: [place] [facility] / [facility]   Surgeons: Raynelle Bring, MD       DISCUSSION:61 y.o. never smoker with h/o HTN, GERD, PAF, OSA on CPAP, prostate cancer scheduled for above procedure 08/30/2021 with Dr. Raynelle Bring.   Per cardiology note 06/20/2021, "Preop cardiovascular exam: He is thinking that it may be a possibility that he may consider surgery for his prostate cancer that was recently diagnosed.   He has had a pretty negative ischemic evaluation and has not really having true anginal symptoms.  He does have some intermittent tachycardia spells which are being pretty well treated with current medications.   At this time I will think any further cardiac evaluation required.  He does not require stress test or other ischemic evaluation. Prostate surgery would be a low risk surgery.  Continue current meds"  Anticipate pt can proceed with planned procedure barring acute status change.   VS: BP (!) 163/90    Pulse 64    Temp 36.7 C (Oral)    Resp 18    Ht 5\' 7"  (1.702 m)    Wt 117 kg    SpO2 96%    BMI 40.41 kg/m   PROVIDERS: [name], MD is PCP   Glenetta Hew, MD is Cardiologist LABS: Labs reviewed: Acceptable for surgery. (all labs ordered are listed, but only abnormal results are displayed)  Labs Reviewed  BASIC METABOLIC PANEL - Abnormal; Notable for the following components:      Result Value   Calcium 8.6 (*)    Anion gap 4 (*)    All other components within normal limits  HEMOGLOBIN A1C - Abnormal; Notable for the following components:   Hgb A1c MFr Bld 6.1 (*)    All other components within normal limits  CBC  GLUCOSE, CAPILLARY  TYPE AND SCREEN     IMAGES:   EKG: 08/09/2021 Rate 65 bpm     CV: Echo 02/28/2021 1. Left ventricular ejection fraction, by estimation, is 60 to 65%. The  left ventricle has normal function. The left ventricle has no regional  wall motion abnormalities. The left ventricular internal cavity size was  mildly dilated. Left ventricular  diastolic parameters are indeterminate. Elevated left ventricular  end-diastolic pressure.   2. Right ventricular systolic function is normal. The right ventricular  size is normal. Tricuspid regurgitation signal is inadequate for assessing  PA pressure.   3. The mitral valve is normal in structure. Mild mitral valve  regurgitation. No evidence of mitral stenosis.   4. The aortic valve is normal in structure. Aortic valve regurgitation is  not visualized. No aortic stenosis is present.   5. Aortic dilatation noted. There is mild dilatation of the ascending  aorta, measuring 40 mm.   6. The inferior vena cava is normal in size with greater than 50%  respiratory variability, suggesting right atrial pressure of 3 mmHg.   Cardiac Cath 09/28/2015 The left ventricular systolic function is normal.   1. Normal coronary arteries. 2. Normal LV systolic function with an ejection fraction of 55-60%. 3. Moderate systemic hypertension and moderately elevated left ventricular end-diastolic pressure.   Recommendations: The patient likely has hypertensive heart disease. Recommend blood pressure control. I added losartan. He is  already on carvedilol Past Medical History:  Diagnosis Date   Arthritis    "lower back" (09/28/2015)   Atypical angina ([place]) dx'd 09/2015   GERD (gastroesophageal reflux disease)    High cholesterol    History of hiatal hernia    Hypertension    OSA on CPAP    PAF (paroxysmal atrial fibrillation) ([place]) 01/04/2021   Prediabetes    Seasonal allergies     Past Surgical History:  Procedure Laterality Date   ANKLE HARDWARE REMOVAL Left 2006   left    BIOPSY  03/09/2019   Procedure: BIOPSY;   Surgeon: Wilford Corner, MD;  Location: [facility];  Service: Endoscopy;;   CARDIAC CATHETERIZATION N/A 09/28/2015   Procedure: Left Heart Cath and Coronary Angiography;  Surgeon: Wellington Hampshire, MD;  Location: [facility];  Service: Cardiovascular;: For possible atypical angina, mildly elevated troponins.  Angiographically normal coronary arteries   CARDIAC EVENT MONITOR  09/2018   Heart rate range 47 bpm - 190 bpm.  Average heart rate 70 bpm.  Low burden of PACs and PVCs.  3 short runs of SVT/PAT with aberrancy versus V. tach.  25 bursts of PAT 3 episodes of what appear to be atrial tach versus atrial fib with a rate of 190 bpm    COLONOSCOPY WITH PROPOFOL N/A 03/09/2019   Procedure: COLONOSCOPY WITH PROPOFOL;  Surgeon: Wilford Corner, MD;  Location: [facility];  Service: Endoscopy;  Laterality: N/A;   ESOPHAGOGASTRODUODENOSCOPY (EGD) WITH PROPOFOL N/A 03/09/2019   Procedure: ESOPHAGOGASTRODUODENOSCOPY (EGD) WITH PROPOFOL;  Surgeon: Wilford Corner, MD;  Location: [facility];  Service: Endoscopy;  Laterality: N/A;   KNEE ARTHROSCOPY  09/20/2011   Procedure: ARTHROSCOPY KNEE;  Surgeon: Gearlean Alf, MD;  Location: [facility];  Service: Orthopedics;  Laterality: Right;  Medial meniscal DEBRIDEMENT with chondroplasty, right knee   KNEE ARTHROSCOPY Left 04/07/2013   Procedure: LEFT KNEE ARTHROSCOPY WITH MEDIAL MENISCUS  DEBRIDEMENT ;  Surgeon: Gearlean Alf, MD;  Location: [facility];  Service: Orthopedics;  Laterality: Left;   LIPOMA EXCISION Right 2004   neck   NASAL SEPTUM SURGERY  1992   ORIF ANKLE FRACTURE Left 1981   TONSILLECTOMY  2008   TRANSTHORACIC ECHOCARDIOGRAM  02/28/2021   EF 60 to 65%.  No R WMA.  Diastolic function, but unable to quantify.  Normal RV.  Normal valves.   UVULOPALATOPHARYNGOPLASTY  2008    MEDICATIONS:  aspirin EC 81 MG EC tablet   atorvastatin (LIPITOR) 40 MG tablet   diltiazem (CARDIZEM CD) 120 MG 24 hr capsule    flecainide (TAMBOCOR) 100 MG tablet   gabapentin (NEURONTIN) 300 MG capsule   LORazepam (ATIVAN) 1 MG tablet   magnesium gluconate (MAGONATE) 500 MG tablet   Melatonin 10 MG TABS   metoprolol tartrate (LOPRESSOR) 25 MG tablet   Multiple Vitamin (MULTIVITAMIN WITH MINERALS) TABS tablet   pantoprazole (PROTONIX) 40 MG tablet   valsartan (DIOVAN) 320 MG tablet   No current facility-administered medications for this encounter.    Joshua Felix Ward, PA-C [facility] [phone]

## 2021-08-28 NOTE — Anesthesia Preprocedure Evaluation (Addendum)
Anesthesia Evaluation  Patient identified by MRN, date of birth, ID band Patient awake    Reviewed: Allergy & Precautions, NPO status , Patient's Chart, lab work & pertinent test results, reviewed documented beta blocker date and time   History of Anesthesia Complications Negative for: history of anesthetic complications  Airway Mallampati: III  TM Distance: >3 FB Neck ROM: Full    Dental  (+) Dental Advisory Given, Teeth Intact   Pulmonary sleep apnea and Continuous Positive Airway Pressure Ventilation ,    Pulmonary exam normal        Cardiovascular hypertension, Pt. on medications and Pt. on home beta blockers Normal cardiovascular exam+ dysrhythmias Atrial Fibrillation    '22 TTE - EF 60 to 65%. LV cavity size was mildly dilated. Mild mitral valve. There is mild dilatation of the ascending  aorta, measuring 40 mm.      Neuro/Psych negative neurological ROS  negative psych ROS   GI/Hepatic Neg liver ROS, hiatal hernia, GERD  Medicated and Controlled,  Endo/Other  Morbid obesity Pre-DM   Renal/GU negative Renal ROS    Prostate cancer     Musculoskeletal  (+) Arthritis ,   Abdominal   Peds  Hematology negative hematology ROS (+)   Anesthesia Other Findings   Reproductive/Obstetrics                           Anesthesia Physical Anesthesia Plan  ASA: 3  Anesthesia Plan: General   Post-op Pain Management: Tylenol PO (pre-op)* and Celebrex PO (pre-op)*   Induction: Intravenous  PONV Risk Score and Plan: 2 and Treatment may vary due to age or medical condition, Ondansetron, Dexamethasone and Midazolam  Airway Management Planned: Oral ETT  Additional Equipment: None  Intra-op Plan:   Post-operative Plan: Extubation in OR  Informed Consent: I have reviewed the patients History and Physical, chart, labs and discussed the procedure including the risks, benefits and alternatives  for the proposed anesthesia with the patient or authorized representative who has indicated his/her understanding and acceptance.     Dental advisory given  Plan Discussed with: CRNA and Anesthesiologist  Anesthesia Plan Comments:       Anesthesia Quick Evaluation

## 2021-08-29 NOTE — H&P (Signed)
Prostate Cancer     Joshua Stanton is a 61 year old gentleman who was found to have an elevated PSA of 4.38 prompting a TRUS biopsy of the prostate by Dr. Abner Greenspan on 05/01/21 that confirmed Gleason 3+4=7 adenocarcinoma with 4 out of 12 biopsy cores positive for malignancy.   He has a history of testosterone deficiency with symptoms of depressed mood and fatigue and had been on treatment with testosterone transdermal gels for approximately 10 years before stopping shortly before his biopsy. He most recently has been treated with Testim. Since being off therapy, he has had some increased fatigue and energy loss. He also has noted some decline in libido.   Family history: None.   Imaging studies: None.   PMH: He has a history of atrial fibrillation (not on anticoagulation), hypertension, hyperlipidemia, GERD, sleep apnea (he does use a CPAP machine), and obesity (265 lbs). His cardiologist is Dr. Glenetta Hew and he was most recently evaluated in December 2022. He has a history of testosterone replacement therapy as noted above. He would like to resume therapy in the future if safe to do so.  PSH: No abdominal surgeries.   TNM stage: cT1c Nx Mx  PSA: 4.38  Gleason score: 3+4=7 (GG 2)  Biopsy (05/01/21): 4/12 cores positive  Left: Benign  Right: R mid (20%, 3+4=7), R lateral mid (20%, 3+4=7), R base (90%, 3+4=7), R lateral base (90%, 3+4=7)  Prostate volume: 32.0 cc   Nomogram  OC disease: 72%  EPE: 27%  SVI: 3%  LNI: 3%  PFS (5 year, 10 year): 86%, 77%   Urinary function: IPSS is 14. His main symptoms include urinary urgency, frequency, and weak stream. He is not on medical therapy.  Erectile function: SHIM score is 24. He has not required medical therapy.     ALLERGIES: No Allergies oxycodone    MEDICATIONS: Doxycycline Hyclate 100 mg tablet, delayed release 1 tablet PO Q PM  Metoprolol Succinate  Acid Reflux  Astepro SOLN Nasal  Atorvastatin Calcium  Diltiazem 12Hr Er   Flecainide Acetate  Gabapentin  HydroCHLOROthiazide TABS Oral  Lorazepam  Losartan Potassium  Lunesta TABS Oral  Testim 50 mg/5 gram (1 %) gel Transdermal  Zegerid OTC CAPS Oral     GU PSH: Prostate Needle Biopsy - 05/01/2021       PSH Notes: Nasal Endoscopy (Diagnostic), Tonsillectomy, Excision Of Lesion Neck, Ankle Surgery   NON-GU PSH: Remove Tonsils - 2011 Sinus surgery - 2011 Surgical Pathology, Gross And Microscopic Examination For Prostate Needle - 05/01/2021     GU PMH: Primary hypogonadism - 06/14/2021, - 05/08/2021, - 03/06/2021, - 06/07/2020, Hypogonadism, testicular, - 2014 Prostate Cancer - 06/14/2021, - 05/08/2021 ED due to arterial insufficiency - 05/08/2021, - 03/06/2021, - 06/07/2020, Erectile dysfunction due to arterial insufficiency, - 2014 Urinary Frequency - 05/08/2021, - 05/01/2021, - 03/06/2021, - 06/07/2020, Increased urinary frequency, - 2014 BPH w/LUTS - 05/01/2021, - 03/06/2021, - 06/07/2020 Elevated PSA - 05/01/2021, - 03/06/2021 Encounter for Prostate Cancer screening - 06/07/2020 Priapism (drug-induced) - 06/07/2020 Other microscopic hematuria, Microscopic Hematuria - 2014      PMH Notes:  1898-07-15 00:00:00 - Note: Normal Routine History And Physical Adult  2010-04-30 17:11:40 - Note: Neoplasm Of The Prostate Gland  2010-04-02 15:56:19 - Note: Arthritis   NON-GU PMH: Anxiety, Anxiety (Symptom) - 2014 Personal history of other diseases of the circulatory system, History of hypertension - 2014 Personal history of other diseases of the digestive system, History of esophageal reflux - 2014 Personal history  of other diseases of the nervous system and sense organs, History of sleep apnea - 2014 Personal history of other mental and behavioral disorders, History of depression - 2014    FAMILY HISTORY: Breast Cancer - Mother Death In The Family Father - Runs In Family Family Health Status - Mother's Age - Runs In Family Family Health Status Children  _4__ Living Daughter - Runs In Family Hypertension - Mother   SOCIAL HISTORY: Marital Status: Married     Notes: Alcohol Use, Tobacco Use, Occupation:, Marital History - Currently Married, Caffeine Use   REVIEW OF SYSTEMS:    GU Review Male:   Patient denies frequent urination, hard to postpone urination, burning/ pain with urination, get up at night to urinate, leakage of urine, stream starts and stops, trouble starting your streams, and have to strain to urinate .  Gastrointestinal (Lower):   Patient denies diarrhea and constipation.  Gastrointestinal (Upper):   Patient denies nausea and vomiting.  Constitutional:   Patient denies fever, night sweats, weight loss, and fatigue.  Skin:   Patient denies skin rash/ lesion and itching.  Eyes:   Patient denies blurred vision and double vision.  Ears/ Nose/ Throat:   Patient denies sore throat and sinus problems.  Hematologic/Lymphatic:   Patient denies swollen glands and easy bruising.  Cardiovascular:   Patient denies chest pains and leg swelling.  Respiratory:   Patient denies cough and shortness of breath.  Endocrine:   Patient denies excessive thirst.  Musculoskeletal:   Patient denies back pain and joint pain.  Neurological:   Patient denies headaches and dizziness.  Psychologic:   Patient denies depression and anxiety.   VITAL SIGNS:     Weight 254 lb / 115.21 kg  Height 67 in / 170.18 cm  Temperature 97.3 F / 36.2 C  BMI 39.8 kg/m   MULTI-SYSTEM PHYSICAL EXAMINATION:    Constitutional: Well-nourished. No physical deformities. Normally developed. Good grooming.  Respiratory: No labored breathing, no use of accessory muscles. Clear bilaterally.  Cardiovascular: Normal temperature, normal extremity pulses, no swelling, no varicosities. Regular rate and rhythm.  Gastrointestinal: No mass, no tenderness, no rigidity, obese abdomen.         ASSESSMENT:      ICD-10 Details  1 GU:   Prostate Cancer - C61   2   Primary  hypogonadism - E29.1    PLAN:       1. Favorable intermediate risk prostate cancer: I had a detailed discussion with Joshua Stanton regarding his prostate cancer situation. He is well-informed through his prior discussions with Dr. Abner Greenspan and Dr. Tammi Klippel. The patient was counseled about the natural history of prostate cancer and the standard treatment options that are available for prostate cancer. It was explained to him how his age and life expectancy, clinical stage, Gleason score/prognostic grade group, and PSA (and PSA density) affect his prognosis, the decision to proceed with additional staging studies, as well as how that information influences recommended treatment strategies. We discussed the roles for active surveillance, radiation therapy, surgical therapy, androgen deprivation, as well as ablative therapy and other investigational options for the treatment of prostate cancer as appropriate to his individual cancer situation. We discussed the risks and benefits of these options with regard to their impact on cancer control and also in terms of potential adverse events, complications, and impact on quality of life particularly related to urinary and sexual function. The patient was encouraged to ask questions throughout the discussion today and all questions were  answered to his stated satisfaction. In addition, the patient was provided with and/or directed to appropriate resources and literature for further education about prostate cancer and treatment options.   We discussed surgical therapy for prostate cancer including the different available surgical approaches. We discussed, in detail, the risks and expectations of surgery with regard to cancer control, urinary control, and erectile function as well as the expected postoperative recovery process. Additional risks of surgery including but not limited to bleeding, infection, hernia formation, nerve damage, lymphocele formation, bowel/rectal injury  potentially necessitating colostomy, damage to the urinary tract resulting in urine leakage, urethral stricture, and the cardiopulmonary risks such as myocardial infarction, stroke, death, venothromboembolism, etc. were explained. The risk of open surgical conversion for robotic/laparoscopic prostatectomy was also discussed.   At this time, he does wish to proceed with surgical treatment of his prostate cancer. We will proceed with cardiac clearance per preoperative protocols at the hospital. He will be scheduled for a bilateral nerve sparing robot-assisted laparoscopic radical prostatectomy and bilateral pelvic lymphadenectomy.

## 2021-08-30 ENCOUNTER — Other Ambulatory Visit: Payer: Self-pay

## 2021-08-30 ENCOUNTER — Observation Stay (HOSPITAL_COMMUNITY)
Admission: RE | Admit: 2021-08-30 | Discharge: 2021-08-31 | Disposition: A | Discharge status: Home / Self Care | Payer: 59 | Attending: Urology | Admitting: Urology

## 2021-08-30 ENCOUNTER — Ambulatory Visit (HOSPITAL_BASED_OUTPATIENT_CLINIC_OR_DEPARTMENT_OTHER): Payer: 59 | Admitting: Anesthesiology

## 2021-08-30 ENCOUNTER — Encounter (HOSPITAL_COMMUNITY)
Admission: RE | Disposition: A | Discharge status: Home / Self Care | Payer: Self-pay | Source: Home / Self Care | Attending: Urology

## 2021-08-30 ENCOUNTER — Encounter (HOSPITAL_COMMUNITY): Payer: Self-pay | Admitting: Urology

## 2021-08-30 ENCOUNTER — Ambulatory Visit (HOSPITAL_COMMUNITY): Payer: 59 | Admitting: Physician Assistant

## 2021-08-30 DIAGNOSIS — C61 Malignant neoplasm of prostate: Secondary | ICD-10-CM

## 2021-08-30 DIAGNOSIS — G4733 Obstructive sleep apnea (adult) (pediatric): Secondary | ICD-10-CM | POA: Diagnosis not present

## 2021-08-30 DIAGNOSIS — Z7982 Long term (current) use of aspirin: Secondary | ICD-10-CM | POA: Diagnosis not present

## 2021-08-30 DIAGNOSIS — Z7901 Long term (current) use of anticoagulants: Secondary | ICD-10-CM | POA: Diagnosis not present

## 2021-08-30 DIAGNOSIS — I4891 Unspecified atrial fibrillation: Secondary | ICD-10-CM | POA: Diagnosis not present

## 2021-08-30 DIAGNOSIS — Z9989 Dependence on other enabling machines and devices: Secondary | ICD-10-CM

## 2021-08-30 DIAGNOSIS — Z79899 Other long term (current) drug therapy: Secondary | ICD-10-CM | POA: Diagnosis not present

## 2021-08-30 DIAGNOSIS — I10 Essential (primary) hypertension: Secondary | ICD-10-CM | POA: Diagnosis not present

## 2021-08-30 HISTORY — PX: LYMPHADENECTOMY: SHX5960

## 2021-08-30 HISTORY — PX: ROBOT ASSISTED LAPAROSCOPIC RADICAL PROSTATECTOMY: SHX5141

## 2021-08-30 LAB — ABO/RH: ABO/RH(D): O POS

## 2021-08-30 LAB — TYPE AND SCREEN
ABO/RH(D): O POS
Antibody Screen: NEGATIVE

## 2021-08-30 LAB — HEMOGLOBIN AND HEMATOCRIT, BLOOD
HCT: 41.8 % (ref 39.0–52.0)
Hemoglobin: 13.6 g/dL (ref 13.0–17.0)

## 2021-08-30 SURGERY — XI ROBOTIC ASSISTED LAPAROSCOPIC RADICAL PROSTATECTOMY LEVEL 3
Anesthesia: General

## 2021-08-30 MED ORDER — FENTANYL CITRATE (PF) 250 MCG/5ML IJ SOLN
INTRAMUSCULAR | Status: AC
  Filled 2021-08-30: qty 5

## 2021-08-30 MED ORDER — BUPIVACAINE-EPINEPHRINE 0.25% -1:200000 IJ SOLN
INTRAMUSCULAR | Status: DC | PRN
Start: 2021-08-30 — End: 2021-08-30
  Administered 2021-08-30: 30 mL

## 2021-08-30 MED ORDER — ONDANSETRON HCL 4 MG/2ML IJ SOLN
4.0000 mg | INTRAMUSCULAR | Status: DC | PRN
  Administered 2021-08-30: 4 mg via INTRAVENOUS
  Filled 2021-08-30: qty 2

## 2021-08-30 MED ORDER — ACETAMINOPHEN 325 MG PO TABS
650.0000 mg | ORAL_TABLET | ORAL | Status: DC | PRN
  Administered 2021-08-30: 650 mg via ORAL
  Filled 2021-08-30: qty 2

## 2021-08-30 MED ORDER — EPHEDRINE SULFATE-NACL 50-0.9 MG/10ML-% IV SOSY
PREFILLED_SYRINGE | INTRAVENOUS | Status: DC | PRN
  Administered 2021-08-30 (×2): 5 mg via INTRAVENOUS

## 2021-08-30 MED ORDER — BUPIVACAINE-EPINEPHRINE (PF) 0.25% -1:200000 IJ SOLN
INTRAMUSCULAR | Status: AC
  Filled 2021-08-30: qty 30

## 2021-08-30 MED ORDER — FENTANYL CITRATE PF 50 MCG/ML IJ SOSY
PREFILLED_SYRINGE | INTRAMUSCULAR | Status: AC
  Filled 2021-08-30: qty 2

## 2021-08-30 MED ORDER — GABAPENTIN 300 MG PO CAPS
300.0000 mg | ORAL_CAPSULE | Freq: Every day | ORAL | Status: DC | PRN
  Administered 2021-08-30: 300 mg via ORAL
  Filled 2021-08-30: qty 1

## 2021-08-30 MED ORDER — TRAMADOL HCL 50 MG PO TABS: 50.0000 mg | ORAL_TABLET | Freq: Four times a day (QID) | ORAL | 0 refills | Status: DC | PRN

## 2021-08-30 MED ORDER — LIDOCAINE HCL (PF) 2 % IJ SOLN
INTRAMUSCULAR | Status: AC
  Filled 2021-08-30: qty 5

## 2021-08-30 MED ORDER — LIDOCAINE 2% (20 MG/ML) 5 ML SYRINGE
INTRAMUSCULAR | Status: DC | PRN
  Administered 2021-08-30: 60 mg via INTRAVENOUS

## 2021-08-30 MED ORDER — ACETAMINOPHEN 500 MG PO TABS
1000.0000 mg | ORAL_TABLET | Freq: Once | ORAL | Status: AC
  Administered 2021-08-30: 1000 mg via ORAL
  Filled 2021-08-30: qty 2

## 2021-08-30 MED ORDER — BACITRACIN-NEOMYCIN-POLYMYXIN 400-5-5000 EX OINT: 1.0000 "application " | TOPICAL_OINTMENT | Freq: Three times a day (TID) | CUTANEOUS | Status: DC | PRN

## 2021-08-30 MED ORDER — ONDANSETRON HCL 4 MG/2ML IJ SOLN
INTRAMUSCULAR | Status: DC | PRN
  Administered 2021-08-30: 4 mg via INTRAVENOUS

## 2021-08-30 MED ORDER — CELECOXIB 200 MG PO CAPS
200.0000 mg | ORAL_CAPSULE | Freq: Once | ORAL | Status: AC
  Administered 2021-08-30: 200 mg via ORAL
  Filled 2021-08-30: qty 1

## 2021-08-30 MED ORDER — LACTATED RINGERS IV SOLN: INTRAVENOUS | Status: DC | PRN

## 2021-08-30 MED ORDER — SALINE SPRAY 0.65 % NA SOLN
1.0000 | NASAL | Status: DC | PRN
  Administered 2021-08-30: 1 via NASAL
  Filled 2021-08-30: qty 44

## 2021-08-30 MED ORDER — ROCURONIUM BROMIDE 10 MG/ML (PF) SYRINGE
PREFILLED_SYRINGE | INTRAVENOUS | Status: AC
  Filled 2021-08-30: qty 10

## 2021-08-30 MED ORDER — ATORVASTATIN CALCIUM 40 MG PO TABS
40.0000 mg | ORAL_TABLET | ORAL | Status: DC
Start: 2021-08-31 — End: 2021-08-31
  Administered 2021-08-31: 40 mg via ORAL
  Filled 2021-08-30: qty 1

## 2021-08-30 MED ORDER — PHENYLEPHRINE HCL-NACL 20-0.9 MG/250ML-% IV SOLN
INTRAVENOUS | Status: DC | PRN
  Administered 2021-08-30: 35 ug/min via INTRAVENOUS

## 2021-08-30 MED ORDER — STERILE WATER FOR IRRIGATION IR SOLN
Status: DC | PRN
  Administered 2021-08-30: 1000 mL

## 2021-08-30 MED ORDER — KCL IN DEXTROSE-NACL 20-5-0.45 MEQ/L-%-% IV SOLN
INTRAVENOUS | Status: DC
  Filled 2021-08-30 (×3): qty 1000

## 2021-08-30 MED ORDER — HEPARIN SODIUM (PORCINE) 1000 UNIT/ML IJ SOLN
INTRAMUSCULAR | Status: AC
  Filled 2021-08-30: qty 1

## 2021-08-30 MED ORDER — DOCUSATE SODIUM 100 MG PO CAPS: 100.0000 mg | ORAL_CAPSULE | Freq: Two times a day (BID) | ORAL | Status: DC

## 2021-08-30 MED ORDER — DOCUSATE SODIUM 100 MG PO CAPS
100.0000 mg | ORAL_CAPSULE | Freq: Two times a day (BID) | ORAL | Status: DC
  Administered 2021-08-30 – 2021-08-31 (×2): 100 mg via ORAL
  Filled 2021-08-30 (×2): qty 1

## 2021-08-30 MED ORDER — SULFAMETHOXAZOLE-TRIMETHOPRIM 800-160 MG PO TABS: 1.0000 | ORAL_TABLET | Freq: Two times a day (BID) | ORAL | 0 refills | Status: DC

## 2021-08-30 MED ORDER — DEXAMETHASONE SODIUM PHOSPHATE 10 MG/ML IJ SOLN
INTRAMUSCULAR | Status: DC | PRN
  Administered 2021-08-30: 10 mg via INTRAVENOUS

## 2021-08-30 MED ORDER — CEFAZOLIN SODIUM-DEXTROSE 2-4 GM/100ML-% IV SOLN
2.0000 g | Freq: Once | INTRAVENOUS | Status: AC
  Administered 2021-08-30: 2 g via INTRAVENOUS
  Filled 2021-08-30: qty 100

## 2021-08-30 MED ORDER — HYDROMORPHONE HCL 1 MG/ML IJ SOLN
INTRAMUSCULAR | Status: AC
  Filled 2021-08-30: qty 1

## 2021-08-30 MED ORDER — PANTOPRAZOLE SODIUM 40 MG PO TBEC
40.0000 mg | DELAYED_RELEASE_TABLET | Freq: Every day | ORAL | Status: DC | PRN
  Administered 2021-08-31: 40 mg via ORAL
  Filled 2021-08-30: qty 1

## 2021-08-30 MED ORDER — CEFAZOLIN SODIUM-DEXTROSE 1-4 GM/50ML-% IV SOLN
1.0000 g | Freq: Three times a day (TID) | INTRAVENOUS | Status: AC
  Administered 2021-08-30 (×2): 1 g via INTRAVENOUS
  Filled 2021-08-30 (×2): qty 50

## 2021-08-30 MED ORDER — SODIUM CHLORIDE 0.9 % IV BOLUS
1000.0000 mL | Freq: Once | INTRAVENOUS | Status: AC
  Administered 2021-08-30: 1000 mL via INTRAVENOUS

## 2021-08-30 MED ORDER — ONDANSETRON HCL 4 MG/2ML IJ SOLN
INTRAMUSCULAR | Status: AC
  Filled 2021-08-30: qty 2

## 2021-08-30 MED ORDER — DIPHENHYDRAMINE HCL 50 MG/ML IJ SOLN: 12.5000 mg | Freq: Four times a day (QID) | INTRAMUSCULAR | Status: DC | PRN

## 2021-08-30 MED ORDER — MORPHINE SULFATE (PF) 2 MG/ML IV SOLN
2.0000 mg | INTRAVENOUS | Status: DC | PRN
  Administered 2021-08-30 (×2): 4 mg via INTRAVENOUS
  Filled 2021-08-30 (×2): qty 2

## 2021-08-30 MED ORDER — FENTANYL CITRATE PF 50 MCG/ML IJ SOSY
PREFILLED_SYRINGE | INTRAMUSCULAR | Status: AC
  Filled 2021-08-30: qty 1

## 2021-08-30 MED ORDER — FENTANYL CITRATE (PF) 100 MCG/2ML IJ SOLN
INTRAMUSCULAR | Status: DC | PRN
  Administered 2021-08-30 (×2): 100 ug via INTRAVENOUS
  Administered 2021-08-30: 50 ug via INTRAVENOUS

## 2021-08-30 MED ORDER — CHLORHEXIDINE GLUCONATE CLOTH 2 % EX PADS
6.0000 | MEDICATED_PAD | Freq: Every day | CUTANEOUS | Status: DC
  Administered 2021-08-31: 6 via TOPICAL

## 2021-08-30 MED ORDER — FENTANYL CITRATE PF 50 MCG/ML IJ SOSY
25.0000 ug | PREFILLED_SYRINGE | INTRAMUSCULAR | Status: DC | PRN
  Administered 2021-08-30: 25 ug via INTRAVENOUS
  Administered 2021-08-30 (×2): 50 ug via INTRAVENOUS
  Administered 2021-08-30: 25 ug via INTRAVENOUS

## 2021-08-30 MED ORDER — PROPOFOL 10 MG/ML IV BOLUS
INTRAVENOUS | Status: AC
  Filled 2021-08-30: qty 20

## 2021-08-30 MED ORDER — BELLADONNA ALKALOIDS-OPIUM 16.2-60 MG RE SUPP: 1.0000 | Freq: Four times a day (QID) | RECTAL | Status: DC | PRN

## 2021-08-30 MED ORDER — MIDAZOLAM HCL 5 MG/5ML IJ SOLN
INTRAMUSCULAR | Status: DC | PRN
  Administered 2021-08-30: 2 mg via INTRAVENOUS

## 2021-08-30 MED ORDER — SUGAMMADEX SODIUM 500 MG/5ML IV SOLN
INTRAVENOUS | Status: DC | PRN
  Administered 2021-08-30: 300 mg via INTRAVENOUS

## 2021-08-30 MED ORDER — DIPHENHYDRAMINE HCL 12.5 MG/5ML PO ELIX: 12.5000 mg | ORAL_SOLUTION | Freq: Four times a day (QID) | ORAL | Status: DC | PRN

## 2021-08-30 MED ORDER — ROCURONIUM BROMIDE 10 MG/ML (PF) SYRINGE
PREFILLED_SYRINGE | INTRAVENOUS | Status: DC | PRN
Start: 2021-08-30 — End: 2021-08-30
  Administered 2021-08-30: 20 mg via INTRAVENOUS
  Administered 2021-08-30: 60 mg via INTRAVENOUS
  Administered 2021-08-30: 20 mg via INTRAVENOUS

## 2021-08-30 MED ORDER — HYDROMORPHONE HCL 1 MG/ML IJ SOLN
0.2500 mg | INTRAMUSCULAR | Status: DC | PRN
  Administered 2021-08-30 (×2): 0.5 mg via INTRAVENOUS

## 2021-08-30 MED ORDER — ZOLPIDEM TARTRATE 5 MG PO TABS: 5.0000 mg | ORAL_TABLET | Freq: Every evening | ORAL | Status: DC | PRN

## 2021-08-30 MED ORDER — FLECAINIDE ACETATE 100 MG PO TABS
200.0000 mg | ORAL_TABLET | Freq: Every day | ORAL | Status: DC | PRN
  Filled 2021-08-30: qty 2

## 2021-08-30 MED ORDER — LACTATED RINGERS IV SOLN: INTRAVENOUS | Status: DC

## 2021-08-30 MED ORDER — HYDROMORPHONE HCL 1 MG/ML IJ SOLN
INTRAMUSCULAR | Status: AC
  Administered 2021-08-30: 0.5 mg via INTRAVENOUS
  Filled 2021-08-30: qty 1

## 2021-08-30 MED ORDER — EPHEDRINE 5 MG/ML INJ
INTRAVENOUS | Status: AC
  Filled 2021-08-30: qty 5

## 2021-08-30 MED ORDER — SODIUM CHLORIDE 0.9 % IR SOLN
Status: DC | PRN
  Administered 2021-08-30: 1000 mL

## 2021-08-30 MED ORDER — DILTIAZEM HCL ER COATED BEADS 120 MG PO CP24
120.0000 mg | ORAL_CAPSULE | Freq: Every day | ORAL | Status: DC
  Administered 2021-08-30: 120 mg via ORAL
  Filled 2021-08-30: qty 1

## 2021-08-30 MED ORDER — MIDAZOLAM HCL 2 MG/2ML IJ SOLN
INTRAMUSCULAR | Status: AC
  Filled 2021-08-30: qty 2

## 2021-08-30 MED ORDER — CHLORHEXIDINE GLUCONATE 0.12 % MT SOLN
15.0000 mL | Freq: Once | OROMUCOSAL | Status: AC
  Administered 2021-08-30: 15 mL via OROMUCOSAL

## 2021-08-30 MED ORDER — ORAL CARE MOUTH RINSE: 15.0000 mL | Freq: Once | OROMUCOSAL | Status: AC

## 2021-08-30 MED ORDER — PROPOFOL 10 MG/ML IV BOLUS
INTRAVENOUS | Status: DC | PRN
  Administered 2021-08-30: 200 mg via INTRAVENOUS

## 2021-08-30 MED ORDER — SUGAMMADEX SODIUM 500 MG/5ML IV SOLN
INTRAVENOUS | Status: AC
  Filled 2021-08-30: qty 5

## 2021-08-30 MED ORDER — PROMETHAZINE HCL 25 MG/ML IJ SOLN: 6.2500 mg | INTRAMUSCULAR | Status: DC | PRN

## 2021-08-30 MED ORDER — KETOROLAC TROMETHAMINE 15 MG/ML IJ SOLN
15.0000 mg | Freq: Four times a day (QID) | INTRAMUSCULAR | Status: DC
  Administered 2021-08-30 – 2021-08-31 (×4): 15 mg via INTRAVENOUS
  Filled 2021-08-30 (×4): qty 1

## 2021-08-30 MED ORDER — DEXAMETHASONE SODIUM PHOSPHATE 10 MG/ML IJ SOLN
INTRAMUSCULAR | Status: AC
  Filled 2021-08-30: qty 1

## 2021-08-30 MED ORDER — IRBESARTAN 300 MG PO TABS
300.0000 mg | ORAL_TABLET | Freq: Every day | ORAL | Status: DC
  Administered 2021-08-30 – 2021-08-31 (×2): 300 mg via ORAL
  Filled 2021-08-30 (×2): qty 1

## 2021-08-30 SURGICAL SUPPLY — 66 items
ADH SKN CLS APL DERMABOND .7 (GAUZE/BANDAGES/DRESSINGS) ×2
APL PRP STRL LF DISP 70% ISPRP (MISCELLANEOUS) ×2
APL SWBSTK 6 STRL LF DISP (MISCELLANEOUS) ×2
APPLICATOR COTTON TIP 6 STRL (MISCELLANEOUS) ×2 IMPLANT
APPLICATOR COTTON TIP 6IN STRL (MISCELLANEOUS) ×3
BAG COUNTER SPONGE SURGICOUNT (BAG) IMPLANT
BAG SPNG CNTER NS LX DISP (BAG)
CATH FOLEY 2WAY SLVR 18FR 30CC (CATHETERS) ×3 IMPLANT
CATH ROBINSON RED A/P 16FR (CATHETERS) ×3 IMPLANT
CATH ROBINSON RED A/P 8FR (CATHETERS) ×3 IMPLANT
CATH TIEMANN FOLEY 18FR 5CC (CATHETERS) ×3 IMPLANT
CHLORAPREP W/TINT 26 (MISCELLANEOUS) ×3 IMPLANT
CLIP LIGATING HEM O LOK PURPLE (MISCELLANEOUS) ×6 IMPLANT
COVER SURGICAL LIGHT HANDLE (MISCELLANEOUS) ×3 IMPLANT
COVER TIP SHEARS 8 DVNC (MISCELLANEOUS) ×2 IMPLANT
COVER TIP SHEARS 8MM DA VINCI (MISCELLANEOUS) ×3
CUTTER ECHEON FLEX ENDO 45 340 (ENDOMECHANICALS) ×3 IMPLANT
DERMABOND ADVANCED (GAUZE/BANDAGES/DRESSINGS) ×1
DERMABOND ADVANCED .7 DNX12 (GAUZE/BANDAGES/DRESSINGS) ×2 IMPLANT
DRAIN CHANNEL RND F F (WOUND CARE) IMPLANT
DRAPE ARM DVNC X/XI (DISPOSABLE) ×8 IMPLANT
DRAPE COLUMN DVNC XI (DISPOSABLE) ×2 IMPLANT
DRAPE DA VINCI XI ARM (DISPOSABLE) ×12
DRAPE DA VINCI XI COLUMN (DISPOSABLE) ×3
DRAPE SURG IRRIG POUCH 19X23 (DRAPES) ×3 IMPLANT
DRSG TEGADERM 4X4.75 (GAUZE/BANDAGES/DRESSINGS) ×3 IMPLANT
ELECT PENCIL ROCKER SW 15FT (MISCELLANEOUS) ×3 IMPLANT
ELECT REM PT RETURN 15FT ADLT (MISCELLANEOUS) ×3 IMPLANT
GAUZE 4X4 16PLY ~~LOC~~+RFID DBL (SPONGE) ×3 IMPLANT
GAUZE SPONGE 4X4 12PLY STRL (GAUZE/BANDAGES/DRESSINGS) ×3 IMPLANT
GLOVE SURG ENC MOIS LTX SZ6.5 (GLOVE) ×3 IMPLANT
GLOVE SURG ENC TEXT LTX SZ7.5 (GLOVE) ×6 IMPLANT
GOWN STRL REUS W/TWL LRG LVL3 (GOWN DISPOSABLE) ×9 IMPLANT
HOLDER FOLEY CATH W/STRAP (MISCELLANEOUS) ×3 IMPLANT
IRRIG SUCT STRYKERFLOW 2 WTIP (MISCELLANEOUS) ×3
IRRIGATION SUCT STRKRFLW 2 WTP (MISCELLANEOUS) ×2 IMPLANT
IV LACTATED RINGERS 1000ML (IV SOLUTION) ×3 IMPLANT
KIT TURNOVER KIT A (KITS) IMPLANT
NDL SAFETY ECLIPSE 18X1.5 (NEEDLE) ×2 IMPLANT
NEEDLE HYPO 18GX1.5 SHARP (NEEDLE) ×3
PACK ROBOT UROLOGY CUSTOM (CUSTOM PROCEDURE TRAY) ×3 IMPLANT
PENCIL SMOKE EVACUATOR (MISCELLANEOUS) IMPLANT
RELOAD STAPLE 45 4.1 GRN THCK (STAPLE) ×2 IMPLANT
SEAL CANN UNIV 5-8 DVNC XI (MISCELLANEOUS) ×8 IMPLANT
SEAL XI 5MM-8MM UNIVERSAL (MISCELLANEOUS) ×12
SET TUBE SMOKE EVAC HIGH FLOW (TUBING) ×3 IMPLANT
SOLUTION ELECTROLUBE (MISCELLANEOUS) ×3 IMPLANT
SPIKE FLUID TRANSFER (MISCELLANEOUS) ×3 IMPLANT
STAPLE RELOAD 45 GRN (STAPLE) ×2 IMPLANT
STAPLE RELOAD 45MM GREEN (STAPLE) ×3
SUT ETHILON 3 0 PS 1 (SUTURE) ×3 IMPLANT
SUT MNCRL 3 0 RB1 (SUTURE) ×2 IMPLANT
SUT MNCRL 3 0 VIOLET RB1 (SUTURE) ×2 IMPLANT
SUT MNCRL AB 4-0 PS2 18 (SUTURE) ×6 IMPLANT
SUT MONOCRYL 3 0 RB1 (SUTURE) ×6
SUT PDS PLUS 0 (SUTURE) ×6
SUT PDS PLUS AB 0 CT-2 (SUTURE) ×4 IMPLANT
SUT VIC AB 0 CT1 27 (SUTURE) ×3
SUT VIC AB 0 CT1 27XBRD ANTBC (SUTURE) ×2 IMPLANT
SUT VIC AB 0 UR5 27 (SUTURE) ×3 IMPLANT
SUT VIC AB 2-0 SH 27 (SUTURE) ×6
SUT VIC AB 2-0 SH 27X BRD (SUTURE) ×2 IMPLANT
SYR 27GX1/2 1ML LL SAFETY (SYRINGE) ×3 IMPLANT
TOWEL OR NON WOVEN STRL DISP B (DISPOSABLE) ×3 IMPLANT
TROCAR XCEL NON-BLD 5MMX100MML (ENDOMECHANICALS) IMPLANT
WATER STERILE IRR 1000ML POUR (IV SOLUTION) ×3 IMPLANT

## 2021-08-30 NOTE — Anesthesia Procedure Notes (Signed)
Procedure Name: Intubation Date/Time: 08/30/2021 7:30 AM Performed by: Maxwell Caul, CRNA Pre-anesthesia Checklist: Patient identified, Emergency Drugs available, Suction available and Patient being monitored Patient Re-evaluated:Patient Re-evaluated prior to induction Oxygen Delivery Method: Circle system utilized Preoxygenation: Pre-oxygenation with 100% oxygen Induction Type: IV induction Ventilation: Mask ventilation without difficulty and Oral airway inserted - appropriate to patient size Laryngoscope Size: Glidescope and 4 Grade View: Grade I Tube type: Oral Tube size: 7.5 mm Number of attempts: 2 Airway Equipment and Method: Stylet and Oral airway Placement Confirmation: ETT inserted through vocal cords under direct vision, positive ETCO2 and breath sounds checked- equal and bilateral Secured at: 21 cm Tube secured with: Tape Dental Injury: Teeth and Oropharynx as per pre-operative assessment  Comments: DL x 1 by CRNA with MAC poor view. Glidescope 4 utilized by Dr Verner Chol ETT placed with ease.

## 2021-08-30 NOTE — Transfer of Care (Signed)
Immediate Anesthesia Transfer of Care Note  Patient: Joshua Stanton  Procedure(s) Performed: XI ROBOTIC ASSISTED LAPAROSCOPIC RADICAL PROSTATECTOMY LEVEL 3 LYMPHADENECTOMY, PELVIC (Bilateral)  Patient Location: PACU  Anesthesia Type:General  Level of Consciousness: awake, alert  and oriented  Airway & Oxygen Therapy: Patient Spontanous Breathing and Patient connected to face mask oxygen  Post-op Assessment: Report given to RN and Post -op Vital signs reviewed and stable  Post vital signs: Reviewed and stable  Last Vitals:  Vitals Value Taken Time  BP 155/81 08/30/21 1037  Temp    Pulse 71 08/30/21 1039  Resp 20 08/30/21 1039  SpO2 100 % 08/30/21 1039  Vitals shown include unvalidated device data.  Last Pain:  Vitals:   08/30/21 8757  TempSrc:   PainSc: 0-No pain         Complications: No notable events documented.

## 2021-08-30 NOTE — Discharge Instructions (Signed)

## 2021-08-30 NOTE — Progress Notes (Signed)
°  Patient ID: Joshua Stanton, male   DOB: Nov 18, 1960, 61 y.o.   MRN: [id number]  Post-op note  Subjective: The patient is doing well.  No complaints.  Objective: Vital signs in last 24 hours: Temp:  [97.6 F (36.4 C)-98.5 F (36.9 C)] 97.8 F (36.6 C) (02/16 1523) Pulse Rate:  [64-81] 80 (02/16 1523) Resp:  [11-20] 16 (02/16 1523) BP: (133-185)/(70-88) 159/78 (02/16 1523) SpO2:  [96 %-100 %] 98 % (02/16 1523) Weight:  [587 kg] 117 kg (02/16 0634)  Intake/Output from previous day: No intake/output data recorded. Intake/Output this shift: Total I/O In: 2400 [I.V.:2300; IV Piggyback:100] Out: 2705 [Urine:2475; Drains:80; Blood:150]  Physical Exam:  General: Alert and oriented. Abdomen: Soft, Nondistended. Incisions: Clean and dry. GU: Urine clear.  Lab Results: Recent Labs    08/30/21 1046  HGB 13.6  HCT 41.8    Assessment/Plan: POD#0   1) Continue to monitor, ambulate, IS   [name]. MD   LOS: 0 days   [name] 08/30/2021, 3:47 PM

## 2021-08-30 NOTE — Anesthesia Postprocedure Evaluation (Signed)
Anesthesia Post Note  Patient: Joshua Stanton  Procedure(s) Performed: XI ROBOTIC ASSISTED LAPAROSCOPIC RADICAL PROSTATECTOMY LEVEL 3 LYMPHADENECTOMY, PELVIC (Bilateral)     Patient location during evaluation: PACU Anesthesia Type: General Level of consciousness: awake and alert Pain management: pain level controlled Vital Signs Assessment: post-procedure vital signs reviewed and stable Respiratory status: spontaneous breathing, nonlabored ventilation and respiratory function stable Cardiovascular status: stable and blood pressure returned to baseline Anesthetic complications: no   No notable events documented.  Last Vitals:  Vitals:   08/30/21 1200 08/30/21 1215  BP: (!) 149/74 (!) 147/70  Pulse: 78 64  Resp: 19 19  Temp:  36.4 C  SpO2: 97% 100%    Last Pain:  Vitals:   08/30/21 1215  TempSrc:   PainSc: Conrath Dempsy Damiano

## 2021-08-30 NOTE — Op Note (Signed)
Preoperative diagnosis: Clinically localized adenocarcinoma of the prostate (clinical stage T1c Nx Mx)  Postoperative diagnosis: Clinically localized adenocarcinoma of the prostate (clinical stage T1c Nx Mx)  Procedure:  Robotic assisted laparoscopic radical prostatectomy (bilateral nerve sparing) Bilateral robotic assisted laparoscopic pelvic lymphadenectomy  Surgeon: [name]. M.D.  Assistant: Debbrah Alar, PA-C  An assistant was required for this surgical procedure.  The duties of the assistant included but were not limited to suctioning, passing suture, camera manipulation, retraction. This procedure would not be able to be performed without an Environmental consultant.  Resident: Dr. Bishop Limbo  Anesthesia: General  Complications: None  EBL: 150 mL  IVF:  2000 mL crystalloid  Specimens: Prostate and seminal vesicles Right pelvic lymph nodes Left pelvic lymph nodes  Disposition of specimens: Pathology  Drains: 20 Fr coude catheter # 93 Blake pelvic drain  Indication: Joshua Stanton is a 61 y.o. year old patient with clinically localized prostate cancer.  After a thorough review of the management options for treatment of prostate cancer, he elected to proceed with surgical therapy and the above procedure(s).  We have discussed the potential benefits and risks of the procedure, side effects of the proposed treatment, the likelihood of the patient achieving the goals of the procedure, and any potential problems that might occur during the procedure or recuperation. Informed consent has been obtained.  Description of procedure:  The patient was taken to the operating room and a general anesthetic was administered. He was given preoperative antibiotics, placed in the dorsal lithotomy position, and prepped and draped in the usual sterile fashion. Next a preoperative timeout was performed. A urethral catheter was placed into the bladder and a site was selected near the  umbilicus for placement of the camera port. This was placed using a standard open Hassan technique which allowed entry into the peritoneal cavity under direct vision and without difficulty. An 8 mm robotic port was placed and a pneumoperitoneum established. The camera was then used to inspect the abdomen and there was no evidence of any intra-abdominal injuries or other abnormalities. The remaining abdominal ports were then placed. 8 mm robotic ports were placed in the right lower quadrant, left lower quadrant, and far left lateral abdominal wall. A 5 mm port was placed in the right upper quadrant and a 12 mm port was placed in the right lateral abdominal wall for laparoscopic assistance. All ports were placed under direct vision without difficulty. The surgical cart was then docked.   Utilizing the cautery scissors, the bladder was reflected posteriorly allowing entry into the space of Retzius and identification of the endopelvic fascia and prostate. The periprostatic fat was then removed from the prostate allowing full exposure of the endopelvic fascia. The endopelvic fascia was then incised from the apex back to the base of the prostate bilaterally and the underlying levator muscle fibers were swept laterally off the prostate thereby isolating the dorsal venous complex. The dorsal vein was then stapled and divided with a 45 mm Flex Echelon stapler. Attention then turned to the bladder neck which was divided anteriorly thereby allowing entry into the bladder and exposure of the urethral catheter. The catheter balloon was deflated and the catheter was brought into the operative field and used to retract the prostate anteriorly. The posterior bladder neck was then examined and was divided allowing further dissection between the bladder and prostate posteriorly until the vasa deferentia and seminal vessels were identified. The vasa deferentia were isolated, divided, and lifted anteriorly. The seminal  vesicles were  dissected down to their tips with care to control the seminal vascular arterial blood supply. These structures were then lifted anteriorly and the space between Denonvilliers fascia and the anterior rectum was developed with a combination of sharp and blunt dissection. This isolated the vascular pedicles of the prostate.  The lateral prostatic fascia was then sharply incised allowing release of the neurovascular bundles bilaterally. The vascular pedicles of the prostate were then ligated with Weck clips between the prostate and neurovascular bundles and divided with sharp cold scissor dissection resulting in neurovascular bundle preservation. The neurovascular bundles were then separated off the apex of the prostate and urethra bilaterally.  The urethra was then sharply transected allowing the prostate specimen to be disarticulated. The pelvis was copiously irrigated and hemostasis was ensured. There was no evidence for rectal injury.  Attention then turned to the right pelvic sidewall. The fibrofatty tissue between the external iliac vein, confluence of the iliac vessels, hypogastric artery, and Cooper's ligament was dissected free from the pelvic sidewall with care to preserve the obturator nerve. Weck clips were used for lymphostasis and hemostasis. An identical procedure was performed on the contralateral side and the lymphatic packets were removed for permanent pathologic analysis.  Attention then turned to the urethral anastomosis. A 2-0 Vicryl slip knot was placed between Denonvilliers fascia, the posterior bladder neck, and the posterior urethra to reapproximate these structures. A double-armed 3-0 Monocryl suture was then used to perform a 360 running tension-free anastomosis between the bladder neck and urethra. A new urethral catheter was then placed into the bladder and irrigated. There were no blood clots within the bladder and the anastomosis appeared to be watertight. A #19 Blake drain was  then brought through the left lateral 8 mm port site and positioned appropriately within the pelvis. It was secured to the skin with a nylon suture. The surgical cart was then undocked. The right lateral 12 mm port site was closed at the fascial level with a 0 Vicryl suture placed laparoscopically. All remaining ports were then removed under direct vision. The prostate specimen was removed intact within the Endopouch retrieval bag via the periumbilical camera port site. This fascial opening was closed with two running 0 PDS sutures. 0.25% Marcaine was then injected into all port sites and all incisions were reapproximated at the skin level with 4-0 Monocryl subcuticular sutures and Dermabond. The patient appeared to tolerate the procedure well and without complications. The patient was able to be extubated and transferred to the recovery unit in satisfactory condition.   [name] MD

## 2021-08-31 ENCOUNTER — Encounter (HOSPITAL_COMMUNITY): Payer: Self-pay | Admitting: Urology

## 2021-08-31 DIAGNOSIS — C61 Malignant neoplasm of prostate: Secondary | ICD-10-CM | POA: Diagnosis not present

## 2021-08-31 LAB — HEMOGLOBIN AND HEMATOCRIT, BLOOD
HCT: 35.8 % — ABNORMAL LOW (ref 39.0–52.0)
Hemoglobin: 12.1 g/dL — ABNORMAL LOW (ref 13.0–17.0)

## 2021-08-31 MED ORDER — CHLORHEXIDINE GLUCONATE 0.12 % MT SOLN
15.0000 mL | Freq: Two times a day (BID) | OROMUCOSAL | Status: DC
  Administered 2021-08-31: 15 mL via OROMUCOSAL
  Filled 2021-08-31: qty 15

## 2021-08-31 MED ORDER — BISACODYL 10 MG RE SUPP
10.0000 mg | Freq: Once | RECTAL | Status: AC
  Administered 2021-08-31: 10 mg via RECTAL
  Filled 2021-08-31: qty 1

## 2021-08-31 MED ORDER — ORAL CARE MOUTH RINSE: 15.0000 mL | Freq: Two times a day (BID) | OROMUCOSAL | Status: DC

## 2021-08-31 MED ORDER — BISACODYL 10 MG RE SUPP: 10.0000 mg | Freq: Once | RECTAL | Status: DC

## 2021-08-31 MED ORDER — TRAMADOL HCL 50 MG PO TABS
50.0000 mg | ORAL_TABLET | Freq: Four times a day (QID) | ORAL | Status: DC | PRN
  Administered 2021-08-31: 50 mg via ORAL
  Filled 2021-08-31: qty 1

## 2021-08-31 NOTE — Progress Notes (Signed)
1 Day Post-Op Subjective: The patient is doing well.  No nausea or vomiting. Pain is adequately controlled.  Objective: Vital signs in last 24 hours: Temp:  [97.6 F (36.4 C)-98.1 F (36.7 C)] 97.8 F (36.6 C) (02/17 0446) Pulse Rate:  [64-81] 73 (02/17 0446) Resp:  [11-20] 18 (02/17 0446) BP: (133-185)/(66-88) 135/70 (02/17 0446) SpO2:  [96 %-100 %] 99 % (02/17 0446)  Intake/Output from previous day: 02/16 0701 - 02/17 0700 In: 4545.3 [P.O.:1080; I.V.:3302.5; IV Piggyback:162.9] Out: 3600 [Urine:3225; Drains:225; Blood:150] Intake/Output this shift: No intake/output data recorded.  Physical Exam:  General: Alert and oriented. CV: RRR Lungs: Clear bilaterally. GI: Soft, Nondistended. Incisions: Clean, dry, and intact Urine: Clear Extremities: Nontender, no erythema, no edema.  Lab Results: Recent Labs    08/30/21 1046 08/31/21 0446  HGB 13.6 12.1*  HCT 41.8 35.8*      Assessment/Plan: POD# 1 s/p robotic prostatectomy.  1) SL IVF 2) Ambulate, Incentive spirometry 3) Transition to oral pain medication 4) Dulcolax suppository 5) D/C pelvic drain 6) Plan for likely discharge later today  Bishop Limbo, MD [facility] PGY4 [facility] Urologic Surgery    LOS: 0 days   Joshua Stanton 08/31/2021, 7:12 AM

## 2021-08-31 NOTE — Progress Notes (Signed)
Pt given discharge teaching for foley catheter leg bag and exchange as well as care.  Pt belongings returned - clothes and electronics in blue bag and 2 pt belonging bags with wound care items and foley bag.  Pt left hospital wearing leg bag.  Pt was taken to front entrance in wheelchair and was picked up by wife.

## 2021-08-31 NOTE — Discharge Summary (Signed)
Date of admission: 08/30/2021  Date of discharge: 08/31/2021  Admission diagnosis: Prostate Cancer  Discharge diagnosis: Prostate Cancer  History and Physical: For full details, please see admission history and physical. Briefly, Joshua Stanton is a 61 y.o. gentleman with localized prostate cancer.  After discussing management/treatment options, he elected to proceed with surgical treatment.  Hospital Course: JERAD DUNLAP was taken to the operating room on 08/30/2021 and underwent a robotic assisted laparoscopic radical prostatectomy. He tolerated this procedure well and without complications. Postoperatively, he was able to be transferred to a regular hospital room following recovery from anesthesia.  He was able to begin ambulating the night of surgery. He remained hemodynamically stable overnight.  He had excellent urine output with appropriately minimal output from his pelvic drain and his pelvic drain was removed on POD #1.  He was transitioned to oral pain medication, tolerated a clear liquid diet, and had met all discharge criteria and was able to be discharged home later on POD#1.  Laboratory values:  Recent Labs    08/30/21 1046 08/31/21 0446  HGB 13.6 12.1*  HCT 41.8 35.8*    Disposition: Home  Discharge instruction: He was instructed to be ambulatory but to refrain from heavy lifting, strenuous activity, or driving. He was instructed on urethral catheter care.  Discharge medications:   Allergies as of 08/31/2021       Reactions   Carvedilol Other (See Comments)   Makes patient feel like his moving in slow motion   Hydrocodone-acetaminophen    Other reaction(s): feels like a hang over   Oxycodone Nausea Only   Trazodone Hcl    Other reaction(s): priapism        Medication List     STOP taking these medications    aspirin 81 MG EC tablet   Melatonin 10 MG Tabs   multivitamin with minerals Tabs tablet       TAKE these medications     atorvastatin 40 MG tablet Commonly known as: LIPITOR TAKE 1 TABLET(40 MG) BY MOUTH DAILY What changed: See the new instructions.   diltiazem 120 MG 24 hr capsule Commonly known as: CARDIZEM CD Take 2 capsules (240 mg total) by mouth daily. Call the office if tolerating medication will change to 240 mg tablet daily What changed:  how much to take when to take this additional instructions   docusate sodium 100 MG capsule Commonly known as: COLACE Take 1 capsule (100 mg total) by mouth 2 (two) times daily.   flecainide 100 MG tablet Commonly known as: TAMBOCOR TAKE 2 TABLETS BY MOUTH AS DIRECTED FOR RAPID HEART RATE What changed:  how much to take how to take this when to take this reasons to take this additional instructions   gabapentin 300 MG capsule Commonly known as: NEURONTIN Take 300 mg by mouth daily as needed (pain).   LORazepam 1 MG tablet Commonly known as: ATIVAN Take 1 mg by mouth daily as needed for anxiety.   magnesium gluconate 500 MG tablet Commonly known as: MAGONATE Take 500 mg by mouth at bedtime.   metoprolol tartrate 25 MG tablet Commonly known as: LOPRESSOR Take 1 tablet (25 mg total) by mouth daily as needed. May take an additional 25 mg  tablet if needed .   pantoprazole 40 MG tablet Commonly known as: PROTONIX Take 40 mg by mouth daily as needed (heartburn).   sulfamethoxazole-trimethoprim 800-160 MG tablet Commonly known as: BACTRIM DS Take 1 tablet by mouth 2 (two) times daily. Start the  day prior to foley removal appointment   traMADol 50 MG tablet Commonly known as: Ultram Take 1-2 tablets (50-100 mg total) by mouth every 6 (six) hours as needed for moderate pain or severe pain.   valsartan 320 MG tablet Commonly known as: DIOVAN Take 1 tablet (320 mg total) by mouth daily.        Followup: He will followup in 1 week for catheter removal and to discuss his surgical pathology results.

## 2021-08-31 NOTE — Progress Notes (Signed)
Patient was able to get up and ambulate twice in the hall with assistance during the night shift. Patient tolerated the ambulation well.

## 2021-08-31 NOTE — Plan of Care (Signed)
°  Problem: Education: Goal: Knowledge of General Education information will improve Description: Including pain rating scale, medication(s)/side effects and non-pharmacologic comfort measures Outcome: Progressing   Problem: Activity: Goal: Risk for activity intolerance will decrease Outcome: Progressing   Problem: Coping: Goal: Level of anxiety will decrease Outcome: Progressing   Problem: Pain Managment: Goal: General experience of comfort will improve Outcome: Progressing   Problem: Safety: Goal: Ability to remain free from injury will improve Outcome: Progressing   Problem: Skin Integrity: Goal: Risk for impaired skin integrity will decrease Outcome: Progressing   Problem: Education: Goal: Knowledge of the procedure and recovery process will improve Outcome: Progressing   Problem: Pain Management: Goal: General experience of comfort will improve Outcome: Progressing   Problem: Skin Integrity: Goal: Demonstration of wound healing without infection will improve Outcome: Progressing

## 2021-09-01 ENCOUNTER — Emergency Department (HOSPITAL_COMMUNITY)
Admission: EM | Admit: 2021-09-01 | Discharge: 2021-09-01 | Disposition: A | Discharge status: Home / Self Care | Payer: 59 | Attending: Emergency Medicine | Admitting: Emergency Medicine

## 2021-09-01 ENCOUNTER — Encounter (HOSPITAL_COMMUNITY): Payer: Self-pay | Admitting: Emergency Medicine

## 2021-09-01 DIAGNOSIS — Y846 Urinary catheterization as the cause of abnormal reaction of the patient, or of later complication, without mention of misadventure at the time of the procedure: Secondary | ICD-10-CM | POA: Diagnosis not present

## 2021-09-01 DIAGNOSIS — T839XXA Unspecified complication of genitourinary prosthetic device, implant and graft, initial encounter: Secondary | ICD-10-CM

## 2021-09-01 DIAGNOSIS — T83098A Other mechanical complication of other indwelling urethral catheter, initial encounter: Secondary | ICD-10-CM | POA: Insufficient documentation

## 2021-09-01 NOTE — ED Provider Notes (Addendum)
[facility] DEPT Provider Note   CSN: [id number] Arrival date & time: 09/01/21  0436     History  Chief Complaint  Patient presents with   Urinary Catheter Obstruction    Joshua Stanton is a 61 y.o. male.  Presenting to the emergency room with concern for catheter blockage.  Patient states that he had a catheter placed after prostatectomy this past week.  States that his catheter was functioning properly without any issues.  Woke up with increasing pressure in his bladder and concerned that his catheter had stopped flowing.  While in ER when it was hooked up to a new bag it started to flow freely.  Patient overall has been doing quite well postoperatively, states that he now has no bladder pressure.  No abdominal pain, vomiting, fever or chills.  The urine is now clear.  Reviewed chart, recent robotic assisted laparoscopic radical prostatectomy by Dr. Alinda Money with urology on 2/16.  HPI     Home Medications Prior to Admission medications   Medication Sig Start Date End Date Taking? Authorizing Provider  atorvastatin (LIPITOR) 40 MG tablet TAKE 1 TABLET(40 MG) BY MOUTH DAILY Patient taking differently: Take 40 mg by mouth every other day. 05/14/21   Leonie Man, MD  diltiazem (CARDIZEM CD) 120 MG 24 hr capsule Take 2 capsules (240 mg total) by mouth daily. Call the office if tolerating medication will change to 240 mg tablet daily Patient taking differently: Take 120 mg by mouth at bedtime. 06/21/21   Leonie Man, MD  docusate sodium (COLACE) 100 MG capsule Take 1 capsule (100 mg total) by mouth 2 (two) times daily. 08/30/21   Debbrah Alar, PA-C  flecainide (TAMBOCOR) 100 MG tablet TAKE 2 TABLETS BY MOUTH AS DIRECTED FOR RAPID HEART RATE Patient taking differently: Take 200 mg by mouth daily as needed (tachycardia). 01/04/21   Leonie Man, MD  gabapentin (NEURONTIN) 300 MG capsule Take 300 mg by mouth daily as needed (pain).    [provider]  LORazepam (ATIVAN) 1 MG tablet Take 1 mg by mouth daily as needed for anxiety.     [provider]  magnesium gluconate (MAGONATE) 500 MG tablet Take 500 mg by mouth at bedtime.    [provider]  metoprolol tartrate (LOPRESSOR) 25 MG tablet Take 1 tablet (25 mg total) by mouth daily as needed. May take an additional 25 mg  tablet if needed . 01/04/21   Leonie Man, MD  pantoprazole (PROTONIX) 40 MG tablet Take 40 mg by mouth daily as needed (heartburn).    [provider]  sulfamethoxazole-trimethoprim (BACTRIM DS) 800-160 MG tablet Take 1 tablet by mouth 2 (two) times daily. Start the day prior to foley removal appointment 08/30/21   Debbrah Alar, PA-C  traMADol (ULTRAM) 50 MG tablet Take 1-2 tablets (50-100 mg total) by mouth every 6 (six) hours as needed for moderate pain or severe pain. 08/30/21   Debbrah Alar, PA-C  valsartan (DIOVAN) 320 MG tablet Take 1 tablet (320 mg total) by mouth daily. 06/21/21   Leonie Man, MD      Allergies    Carvedilol, Hydrocodone-acetaminophen, Oxycodone, and Trazodone hcl    Review of Systems   Review of Systems  All other systems reviewed and are negative.  Physical Exam Updated Vital Signs BP (!) 176/120 (BP Location: Left Arm)    Pulse 72    Temp 98.4 F (36.9 C) (Oral)    Resp 18  Ht 5\' 7"  (1.702 m)    Wt 117.9 kg    SpO2 95%    BMI 40.72 kg/m  Physical Exam Vitals and nursing note reviewed.  Constitutional:      General: He is not in acute distress.    Appearance: He is well-developed.  HENT:     Head: Normocephalic and atraumatic.  Eyes:     Conjunctiva/sclera: Conjunctivae normal.  Cardiovascular:     Rate and Rhythm: Normal rate and regular rhythm.     Heart sounds: No murmur heard. Pulmonary:     Effort: Pulmonary effort is normal. No respiratory distress.     Breath sounds: Normal breath sounds.  Abdominal:     Palpations: Abdomen is soft.     Tenderness: There is no abdominal  tenderness.  Genitourinary:    Comments: Catheter is intact, flowing clear yellow urine Musculoskeletal:        General: No swelling.     Cervical back: Neck supple.  Skin:    General: Skin is warm and dry.     Capillary Refill: Capillary refill takes less than 2 seconds.  Neurological:     Mental Status: He is alert.  Psychiatric:        Mood and Affect: Mood normal.    ED Results / Procedures / Treatments   Labs (all labs ordered are listed, but only abnormal results are displayed) Labs Reviewed - No data to display  EKG None  Radiology No results found.  Procedures Procedures    Medications Ordered in ED Medications - No data to display  ED Course/ Medical Decision Making/ A&P                           Medical Decision Making  61 year old presenting to ER with concern for catheter blockage.  Prior to my evaluation of patient RN staff had hooked his catheter out to a new bag and it started flowing spontaneously without any intervention.  Patient states that the sensation of bladder pressure has completely resolved.  He has no ongoing medical complaint at present.  Given this, do not feel he needs any work-up in ER and can go home and follow-up with his urologist.  Suspect patient may have had transient blockage of his catheter causing transient obstruction.  Additional history obtained from chart review, recent robotic assisted laparoscopic radical prostatectomy by Dr. Alinda Money with urology on 2/16.        Final Clinical Impression(s) / ED Diagnoses Final diagnoses:  Problem with urinary catheter [facility])    Rx / DC Orders ED Discharge Orders     None         Lucrezia Starch, MD 09/01/21 3646    Lucrezia Starch, MD 09/01/21 8032    Lucrezia Starch, MD 09/01/21 [id number]

## 2021-09-01 NOTE — ED Notes (Addendum)
Began to flush catheter and is now flowing freely.

## 2021-09-01 NOTE — Discharge Instructions (Signed)
Follow-up with your urologist.  Contact our office Monday morning to get close follow-up appointment.  Come back to ER if you have any concern for recurrent obstruction, fever, abdominal pain or other new concerning symptom.

## 2021-09-01 NOTE — ED Triage Notes (Signed)
Pt reports that his foley catheter stopped flowing earlier last night. Reports bladder pressure.

## 2021-09-02 ENCOUNTER — Emergency Department (HOSPITAL_COMMUNITY)
Admission: EM | Admit: 2021-09-02 | Discharge: 2021-09-02 | Disposition: A | Discharge status: Home / Self Care | Payer: 59 | Attending: Emergency Medicine | Admitting: Emergency Medicine

## 2021-09-02 ENCOUNTER — Encounter (HOSPITAL_COMMUNITY): Payer: Self-pay

## 2021-09-02 ENCOUNTER — Other Ambulatory Visit: Payer: Self-pay

## 2021-09-02 DIAGNOSIS — Z8546 Personal history of malignant neoplasm of prostate: Secondary | ICD-10-CM | POA: Insufficient documentation

## 2021-09-02 DIAGNOSIS — I10 Essential (primary) hypertension: Secondary | ICD-10-CM | POA: Diagnosis not present

## 2021-09-02 DIAGNOSIS — Z79899 Other long term (current) drug therapy: Secondary | ICD-10-CM | POA: Insufficient documentation

## 2021-09-02 DIAGNOSIS — I4892 Unspecified atrial flutter: Secondary | ICD-10-CM | POA: Diagnosis not present

## 2021-09-02 DIAGNOSIS — T83098A Other mechanical complication of other indwelling urethral catheter, initial encounter: Secondary | ICD-10-CM | POA: Insufficient documentation

## 2021-09-02 DIAGNOSIS — T839XXA Unspecified complication of genitourinary prosthetic device, implant and graft, initial encounter: Secondary | ICD-10-CM

## 2021-09-02 MED ORDER — METOPROLOL TARTRATE 25 MG PO TABS
12.5000 mg | ORAL_TABLET | Freq: Once | ORAL | Status: AC
Start: 2021-09-02 — End: 2021-09-02
  Administered 2021-09-02: 12.5 mg via ORAL
  Filled 2021-09-02: qty 1

## 2021-09-02 MED ORDER — FLECAINIDE ACETATE 50 MG PO TABS
50.0000 mg | ORAL_TABLET | Freq: Once | ORAL | Status: AC
  Administered 2021-09-02: 50 mg via ORAL
  Filled 2021-09-02: qty 1

## 2021-09-02 NOTE — ED Provider Notes (Signed)
[facility] DEPT Provider Note   CSN: [id number] Arrival date & time: 09/02/21  1838     History  Chief Complaint  Patient presents with   Clogged Catheter    BRACH BIRDSALL is a 61 y.o. male.  Patient with history of paroxysmal atrial tachycardia, afib, hypertension, and prostate cancer presents today with complaints of urinary catheter blockage.  Patient states that he had a catheter placed after robotic assisted laparoscopic radical prostatectomy by Dr. Alinda Money with urology on 2/16.  States that his catheter has been functioning properly without any issues until 2 pm when it suddenly stopped flowing.  Now with associated pressure in his bladder. While in ER when it was hooked up to a new bag it started to flow freely.  Patient overall has been doing quite well postoperatively, states that he now has no bladder pressure.  No abdominal pain, vomiting, fever or chills.  The urine is now clear. However, patient states that he did feel his heartrate go into the 160s upon arrival here today. History of same, states that when this happens he normally takes a half dose of both his home flecainide and his home metoprolol, however he accidentally forgot his meds when he came in today. He denies any associated dizziness, lightheadedness, chest pain, or shortness of breath.   The history is provided by the patient. No language interpreter was used.      Home Medications Prior to Admission medications   Medication Sig Start Date End Date Taking? Authorizing Provider  atorvastatin (LIPITOR) 40 MG tablet TAKE 1 TABLET(40 MG) BY MOUTH DAILY Patient taking differently: Take 40 mg by mouth every other day. 05/14/21   Leonie Man, MD  diltiazem (CARDIZEM CD) 120 MG 24 hr capsule Take 2 capsules (240 mg total) by mouth daily. Call the office if tolerating medication will change to 240 mg tablet daily Patient taking differently: Take 120 mg by mouth at bedtime.  06/21/21   Leonie Man, MD  docusate sodium (COLACE) 100 MG capsule Take 1 capsule (100 mg total) by mouth 2 (two) times daily. 08/30/21   Debbrah Alar, PA-C  flecainide (TAMBOCOR) 100 MG tablet TAKE 2 TABLETS BY MOUTH AS DIRECTED FOR RAPID HEART RATE Patient taking differently: Take 200 mg by mouth daily as needed (tachycardia). 01/04/21   Leonie Man, MD  gabapentin (NEURONTIN) 300 MG capsule Take 300 mg by mouth daily as needed (pain).    [provider]  LORazepam (ATIVAN) 1 MG tablet Take 1 mg by mouth daily as needed for anxiety.     [provider]  magnesium gluconate (MAGONATE) 500 MG tablet Take 500 mg by mouth at bedtime.    [provider]  metoprolol tartrate (LOPRESSOR) 25 MG tablet Take 1 tablet (25 mg total) by mouth daily as needed. May take an additional 25 mg  tablet if needed . 01/04/21   Leonie Man, MD  pantoprazole (PROTONIX) 40 MG tablet Take 40 mg by mouth daily as needed (heartburn).    [provider]  sulfamethoxazole-trimethoprim (BACTRIM DS) 800-160 MG tablet Take 1 tablet by mouth 2 (two) times daily. Start the day prior to foley removal appointment 08/30/21   Debbrah Alar, PA-C  traMADol (ULTRAM) 50 MG tablet Take 1-2 tablets (50-100 mg total) by mouth every 6 (six) hours as needed for moderate pain or severe pain. 08/30/21   Debbrah Alar, PA-C  valsartan (DIOVAN) 320 MG tablet Take 1 tablet (320 mg total) by  mouth daily. 06/21/21   Leonie Man, MD      Allergies    Carvedilol, Hydrocodone-acetaminophen, Oxycodone, and Trazodone hcl    Review of Systems   Review of Systems  Constitutional:  Negative for chills and fever.  Respiratory:  Negative for shortness of breath.   Cardiovascular:  Positive for palpitations. Negative for chest pain and leg swelling.  Gastrointestinal:  Negative for abdominal pain.  Neurological:  Negative for dizziness, tremors, seizures, syncope, facial asymmetry, speech difficulty,  weakness, light-headedness, numbness and headaches.  All other systems reviewed and are negative.  Physical Exam Updated Vital Signs BP (!) 150/123 (BP Location: Left Arm)    Pulse (!) 160    Temp 99.3 F (37.4 C) (Oral)    Resp 20    Ht 5\' 7"  (1.702 m)    Wt 115.7 kg    SpO2 99%    BMI 39.94 kg/m  Physical Exam Vitals and nursing note reviewed.  Constitutional:      General: He is not in acute distress.    Appearance: Normal appearance. He is normal weight. He is not ill-appearing, toxic-appearing or diaphoretic.     Comments: Patient resting comfortably in bed in no acute distress  HENT:     Head: Normocephalic and atraumatic.  Eyes:     Extraocular Movements: Extraocular movements intact.     Pupils: Pupils are equal, round, and reactive to light.  Cardiovascular:     Rate and Rhythm: Tachycardia present.  Pulmonary:     Effort: Pulmonary effort is normal. No respiratory distress.     Breath sounds: Normal breath sounds.  Abdominal:     General: Abdomen is flat.     Palpations: Abdomen is soft.  Musculoskeletal:        General: Normal range of motion.     Cervical back: Normal range of motion and neck supple.  Skin:    General: Skin is warm and dry.  Neurological:     General: No focal deficit present.     Mental Status: He is alert.  Psychiatric:        Mood and Affect: Mood normal.        Behavior: Behavior normal.    ED Results / Procedures / Treatments   Labs (all labs ordered are listed, but only abnormal results are displayed) Labs Reviewed - No data to display  EKG EKG Interpretation  Date/Time:  Sunday September 02 2021 19:24:57 EST Ventricular Rate:  157 PR Interval:    QRS Duration: 95 QT Interval:  276 QTC Calculation: 446 R Axis:   -10 Text Interpretation: Atrial flutter with predominant 2:1 AV block Abnormal R-wave progression, early transition LVH with secondary repolarization abnormality ST depression, probably rate related Since last tracing  rate faster and rapid Atrial flutter is new Otherwise no significant change Confirmed by Daleen Bo [phone]) on 09/02/2021 7:33:02 PM  Radiology No results found.  Procedures Procedures    Medications Ordered in ED Medications  flecainide (TAMBOCOR) tablet 50 mg (has no administration in time range)  metoprolol tartrate (LOPRESSOR) tablet 12.5 mg (has no administration in time range)    ED Course/ Medical Decision Making/ A&P                           Medical Decision Making Risk Prescription drug management.   61 year old presenting to ER with concern for catheter blockage.  Prior to my evaluation of patient he had disconnected his catheter  from the bag and it started flowing spontaneously without any intervention.  Patient states that the sensation of bladder pressure has completely resolved. Urine in bag noninfectious appearing   He was also noted to have heart rate in the 150s with ECG reading of atrial flutter. He states that this occurred upon his arrival here today. Patient with history of same for which he takes flecainide and metoprolol. States that this happens intermittently and he normally takes half his home dose of flecainide and metoprolol with resolution, however he forgot his home meds when he left today. ECG reviewed, no evidence of QT prolongation. Will give half dose of home meds and reassess.  Upon reassessment following administration of medication, patient now in sinus rhythm with heart rate in the 70s. He has no ongoing medical complaint at present. Denies any headache, chest pain, or shortness of breath.   Additionally, I have discussed patient with Urology as this is the second time this has happened in 24 hours. They recommend flushing and giving the patient a new leg bag.   100 ccs flushed and adequately drained from the catheter without evidence of blood or clots. Nursing staff provided teaching for how to best flush the catheter as they did not seem to  be aware of how to do this properly. Given this, do not feel he needs any work-up in ER and can go home and follow-up with his urologist. He has an appointment on Thursday for removal of catheter. He is understanding and amenable with plan, educated on red flag symptoms that would prompt immediate return. Discharged in stable condition.   Findings and plan of care discussed with supervising physician Dr. Kathrynn Humble who is in agreement.    Final Clinical Impression(s) / ED Diagnoses Final diagnoses:  Problem with urinary catheter (HCC)  Atrial flutter with rapid ventricular response ([facility])    Rx / DC Orders ED Discharge Orders     None     An After Visit Summary was printed and given to the patient.     Nestor Lewandowsky 09/02/21 2223    Varney Biles, MD 09/02/21 2328

## 2021-09-02 NOTE — ED Triage Notes (Addendum)
Pt. Arrived POV c/o a clogged catheter since 2PM. He was seen recently for the same issue. He reports bladder pain. Pt. Also has a HR in the 160s. Pt. Reports having prostate surgery Thursday.

## 2021-09-02 NOTE — ED Notes (Signed)
Bladder irrigated with 100 cc of sterile water. Catheter flushes smoothly with clear urine returned. Urinary drainage bag changed.

## 2021-09-05 LAB — SURGICAL PATHOLOGY

## 2021-09-12 ENCOUNTER — Other Ambulatory Visit: Payer: Self-pay

## 2021-09-12 ENCOUNTER — Ambulatory Visit: Payer: 59 | Admitting: Pharmacist

## 2021-09-12 VITALS — BP 120/76 | HR 81 | Resp 16 | Ht 67.0 in | Wt 253.8 lb

## 2021-09-12 DIAGNOSIS — N401 Enlarged prostate with lower urinary tract symptoms: Secondary | ICD-10-CM | POA: Insufficient documentation

## 2021-09-12 DIAGNOSIS — C61 Malignant neoplasm of prostate: Secondary | ICD-10-CM | POA: Insufficient documentation

## 2021-09-12 DIAGNOSIS — F5101 Primary insomnia: Secondary | ICD-10-CM | POA: Insufficient documentation

## 2021-09-12 DIAGNOSIS — E119 Type 2 diabetes mellitus without complications: Secondary | ICD-10-CM | POA: Insufficient documentation

## 2021-09-12 DIAGNOSIS — I10 Essential (primary) hypertension: Secondary | ICD-10-CM

## 2021-09-12 DIAGNOSIS — E291 Testicular hypofunction: Secondary | ICD-10-CM | POA: Insufficient documentation

## 2021-09-12 DIAGNOSIS — E7800 Pure hypercholesterolemia, unspecified: Secondary | ICD-10-CM | POA: Insufficient documentation

## 2021-09-12 DIAGNOSIS — I471 Supraventricular tachycardia: Secondary | ICD-10-CM | POA: Diagnosis not present

## 2021-09-12 DIAGNOSIS — J301 Allergic rhinitis due to pollen: Secondary | ICD-10-CM | POA: Insufficient documentation

## 2021-09-12 DIAGNOSIS — N3281 Overactive bladder: Secondary | ICD-10-CM | POA: Insufficient documentation

## 2021-09-12 DIAGNOSIS — F419 Anxiety disorder, unspecified: Secondary | ICD-10-CM | POA: Insufficient documentation

## 2021-09-12 DIAGNOSIS — E559 Vitamin D deficiency, unspecified: Secondary | ICD-10-CM | POA: Insufficient documentation

## 2021-09-12 DIAGNOSIS — E1169 Type 2 diabetes mellitus with other specified complication: Secondary | ICD-10-CM | POA: Insufficient documentation

## 2021-09-12 DIAGNOSIS — N4830 Priapism, unspecified: Secondary | ICD-10-CM | POA: Insufficient documentation

## 2021-09-12 MED ORDER — PROPRANOLOL HCL 20 MG PO TABS: ORAL_TABLET | ORAL | 0 refills | Status: DC

## 2021-09-12 MED ORDER — DILTIAZEM HCL ER COATED BEADS 120 MG PO CP24: 120.0000 mg | ORAL_CAPSULE | Freq: Every day | ORAL | 3 refills | Status: DC

## 2021-09-12 NOTE — Progress Notes (Signed)
Patient ID: Joshua Stanton                 DOB: June 14, 1961                      MRN: [id number] ? ? ? ? ?HPI: ?Joshua Stanton is a 61 y.o. male referred by Dr. [name] to HTN clinic. PMH is significant for atrial tachycardia, A Fib, HTN, OSA, T2DM, and prostate cancer.  Had prostatectomy on 08/30/21.   ? ?Patient presents today with wife. Had reviewed last HR and BP readings from December. Heart rate spikes up unexpectedly and there is a corresponding increase in BP.  Per wife, patient also has increase in anxiety during these episodes.  Takes 1/2 flecainide tablet (50mg ) and 1/2 metoprolol (12.5mg ) tablet when he feels it starting.  Takes about 1-2 hours for episode to resolve.  Has not been able to pinpoint cause of tachycardia. Drinks 1 cup of coffee in the morning. ? ?Had no episodes the week of his prostate surgery. However, urinary catheter blocked twice and he reported to ER. At the second visit, HR elevated to 160 and was relieved by metoprolol and flecainide.  ? ?Had patient email over last month's BP and pulse readings. Has not checked BP/pulse as frequently as he was in the past. In last month has only been checking it if he feels pulse rate increasing.  Had episode at home today around noon (appointment at 1430), took flecainide and metoprolol and did not feel better until closer to 1400-1430.  BP normal at visit today. ? ?Patient was prescribed diltiazem 240mg  (two 120mg  tablets) for rate and BP control but misunderstood directions and has only been taking 120mg  daily along with his valsartan 320mg . ? ?Patient intolerant to carvedilol. Was on 6.25mg  BID in the past.  ? ?Current HTN meds:  ?Valsartan 320mg  daily ?Diltiazem (120mg  x 2) po daily ? ?Metoprolol 25mg  prn tachycardia ?Flecainide 2000mg  prn tachycardia ? ? ?Wt Readings from Last 3 Encounters:  ?09/02/21 255 lb (115.7 kg)  ?09/01/21 260 lb (117.9 kg)  ?08/30/21 257 lb 15 oz (117 kg)  ? ?BP Readings from Last 3 Encounters:  ?09/02/21  (!) 150/89  ?09/01/21 (!) 176/120  ?08/31/21 135/70  ? ?Pulse Readings from Last 3 Encounters:  ?09/02/21 76  ?09/01/21 72  ?08/31/21 73  ? ? ?Renal function: ?Estimated Creatinine Clearance: 88.7 mL/min (by C-G formula based on SCr of 1.09 mg/dL). ? ?Past Medical History:  ?Diagnosis Date  ? Arthritis   ? "lower back" (09/28/2015)  ? 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 GERD (gastroesophageal reflux disease)   ? High cholesterol   ? History of hiatal hernia   ? Hypertension   ? OSA on CPAP   ? PAF (paroxysmal atrial fibrillation) ([facility]) 01/04/2021  ? Prediabetes   ? Seasonal allergies   ? ? ?Current Outpatient Medications on File Prior to Visit  ?Medication Sig Dispense Refill  ? atorvastatin (LIPITOR) 40 MG tablet TAKE 1 TABLET(40 MG) BY MOUTH DAILY (Patient taking differently: Take 40 mg by mouth every other day.) 90 tablet 3  ? diltiazem (CARDIZEM CD) 120 MG 24 hr capsule Take 2 capsules (240 mg total) by mouth daily. Call the office if tolerating medication will change to 240 mg tablet daily (Patient taking differently: Take 120 mg by mouth at bedtime.) 60 capsule 3  ? docusate sodium (COLACE) 100 MG capsule Take 1 capsule (100 mg total) by mouth 2 (two) times daily.    ?  flecainide (TAMBOCOR) 100 MG tablet TAKE 2 TABLETS BY MOUTH AS DIRECTED FOR RAPID HEART RATE (Patient taking differently: Take 200 mg by mouth daily as needed (tachycardia).) 40 tablet 6  ? gabapentin (NEURONTIN) 300 MG capsule Take 300-600 mg by mouth 2 (two) times daily as needed (pain).    ? LORazepam (ATIVAN) 1 MG tablet Take 1 mg by mouth daily as needed for anxiety.     ? magnesium gluconate (MAGONATE) 500 MG tablet Take 500 mg by mouth at bedtime.    ? Melatonin 10 MG TABS Take 1 tablet by mouth at bedtime.    ? metoprolol tartrate (LOPRESSOR) 25 MG tablet Take 1 tablet (25 mg total) by mouth daily as needed. May take an additional 25 mg  tablet if needed . (Patient taking differently: Take 25 mg by mouth daily as needed (tachycardia).)  110 tablet 3  ? pantoprazole (PROTONIX) 40 MG tablet Take 40 mg by mouth daily as needed (heartburn).    ? sulfamethoxazole-trimethoprim (BACTRIM DS) 800-160 MG tablet Take 1 tablet by mouth 2 (two) times daily. Start the day prior to foley removal appointment 6 tablet 0  ? traMADol (ULTRAM) 50 MG tablet Take 1-2 tablets (50-100 mg total) by mouth every 6 (six) hours as needed for moderate pain or severe pain. 20 tablet 0  ? valsartan (DIOVAN) 320 MG tablet Take 1 tablet (320 mg total) by mouth daily. 30 tablet 4  ? ?No current facility-administered medications on file prior to visit.  ? ? ?Allergies  ?Allergen Reactions  ? Carvedilol Other (See Comments)  ?  Makes patient feel like his moving in slow motion  ? Hydrocodone-Acetaminophen   ?  Other reaction(s): feels like a hang over  ? Oxycodone Nausea Only  ? Trazodone Hcl   ?  Other reaction(s): priapism  ? ? ? ?Assessment/Plan: ? ?1. Hypertension -  BP in room today 120/76 which is at goal of <130/80 and pulse rate 81 bpm.  Both in normal range although earlier today his pulse increased to 114.   ? ?Patient concerned it is taking too long for his pulse to reduce to a normal rate and also that his BP elevates whn pulse is elevated.  Likely an anxiety component to this which wife believes is part of the issue.   ? ?Discussed possible medical therapies with patient. Does not need much BP lowering since BP at goal when pulse is not elevated.  Concern that it is taking too long for pulse to drop to normal rate and the corresponding elevation in BP.  Will switch patient from metoprolol 12.5mg  to immediate release propranolol which may act faster and help relieve his corresponding anxiety.  Patient is nervous regarding beta blockers. Recommended he try 10mg  (1/2 of a 20mg  tablet) at first and see if it relieves symptoms.  Can increase to 20mg  (full tablet) if needed which I feel he will likely need to do.   ? ?Recommended patient send updated home readings through  myChart or call.  Has follow up with Dr [name] in 4 weeks. ? ?Per Dr [name], order for CMP plus magnesium placed. ? ?Continue valsartan 320mg  daily ?Continue diltiazem 120mg  daily ?Continue flecainide 50mg  prn tachycardia ?D/c metoprolol ?Start propranolol 20mg , 1/2 to 1 tablet prn tachycardia ? ?Karren Cobble, PharmD, BCACP, [place], CPP ?[address], [address] ?[place], [place], [address] ?Phone: [phone], Fax: [phone]  ?

## 2021-09-12 NOTE — Patient Instructions (Addendum)
It was nice meeting you two today ? ?Continue checking your blood pressure and pulse ? ?Continue your Valsartan 320mg  daily ?Continue your diltiazem 120mg  daily ? ?We will change your metoprolol to a new medication called propranolol 20mg .  When you feel an episode starting, you can take 1/2 tablet.  Increase to a whole tablet as needed ? ?Continue your flecainide as needed ? ?Please call with any questions ? ?Karren Cobble, PharmD, BCACP, [place], CPP ?[address], [address] ?[place], [place], [address] ?Phone: [phone], Fax: [phone]  ? ?

## 2021-09-13 LAB — COMPREHENSIVE METABOLIC PANEL
ALT: 27 IU/L (ref 0–44)
AST: 24 IU/L (ref 0–40)
Albumin/Globulin Ratio: 1.6 (ref 1.2–2.2)
Albumin: 4.3 g/dL (ref 3.8–4.9)
Alkaline Phosphatase: 103 IU/L (ref 44–121)
BUN/Creatinine Ratio: 8 — ABNORMAL LOW (ref 9–20)
BUN: 9 mg/dL (ref 6–24)
Bilirubin Total: 0.3 mg/dL (ref 0.0–1.2)
CO2: 27 mmol/L (ref 20–29)
Calcium: 9.2 mg/dL (ref 8.7–10.2)
Chloride: 103 mmol/L (ref 96–106)
Creatinine, Ser: 1.07 mg/dL (ref 0.76–1.27)
Globulin, Total: 2.7 g/dL (ref 1.5–4.5)
Glucose: 103 mg/dL — ABNORMAL HIGH (ref 70–99)
Potassium: 4.4 mmol/L (ref 3.5–5.2)
Sodium: 143 mmol/L (ref 134–144)
Total Protein: 7 g/dL (ref 6.0–8.5)
eGFR: 80 mL/min/{1.73_m2} (ref >59)

## 2021-09-13 LAB — MAGNESIUM: Magnesium: 2.4 mg/dL — ABNORMAL HIGH (ref 1.6–2.3)

## 2021-09-21 ENCOUNTER — Encounter: Payer: Self-pay | Admitting: Cardiology

## 2021-09-24 NOTE — Telephone Encounter (Signed)
Glad to hear he is doing better ? ?DH ?

## 2021-10-16 ENCOUNTER — Encounter: Payer: Self-pay | Admitting: Cardiology

## 2021-10-16 ENCOUNTER — Encounter: Payer: Self-pay | Admitting: Pharmacist

## 2021-10-16 NOTE — Telephone Encounter (Signed)
Per mom part, these blood pressures look pretty good. ? ?Some of the symptomatic episodes with been time to take the as needed medicines. ? ? ?Glenetta Hew, MD ? ?

## 2021-10-17 ENCOUNTER — Ambulatory Visit (INDEPENDENT_AMBULATORY_CARE_PROVIDER_SITE_OTHER): Payer: 59 | Admitting: Cardiology

## 2021-10-17 ENCOUNTER — Encounter: Payer: Self-pay | Admitting: Cardiology

## 2021-10-17 VITALS — BP 130/74 | HR 63 | Ht 67.0 in | Wt 258.6 lb

## 2021-10-17 DIAGNOSIS — G4733 Obstructive sleep apnea (adult) (pediatric): Secondary | ICD-10-CM | POA: Diagnosis not present

## 2021-10-17 DIAGNOSIS — I480 Paroxysmal atrial fibrillation: Secondary | ICD-10-CM

## 2021-10-17 DIAGNOSIS — E1169 Type 2 diabetes mellitus with other specified complication: Secondary | ICD-10-CM

## 2021-10-17 DIAGNOSIS — I10 Essential (primary) hypertension: Secondary | ICD-10-CM | POA: Diagnosis not present

## 2021-10-17 DIAGNOSIS — I4719 Other supraventricular tachycardia: Secondary | ICD-10-CM

## 2021-10-17 DIAGNOSIS — E7849 Other hyperlipidemia: Secondary | ICD-10-CM

## 2021-10-17 DIAGNOSIS — I471 Supraventricular tachycardia: Secondary | ICD-10-CM

## 2021-10-17 DIAGNOSIS — E785 Hyperlipidemia, unspecified: Secondary | ICD-10-CM

## 2021-10-17 MED ORDER — FLECAINIDE ACETATE 50 MG PO TABS
25.0000 mg | ORAL_TABLET | Freq: Two times a day (BID) | ORAL | 3 refills | Status: DC
Start: 2021-10-17 — End: 2022-02-19

## 2021-10-17 NOTE — Telephone Encounter (Signed)
Patient discussed at office appointment on 10/17/21 ?

## 2021-10-17 NOTE — Progress Notes (Signed)
? ? ?Primary Care Provider: [name], MD ?Cardiologist: Glenetta Hew, MD ?Electrophysiologist: Thompson Grayer, MD ? ?Clinic Note: ?Chief Complaint  ?Patient presents with  ? Follow-up  ?  Feeling better.  ? Hypertension  ?  Blood pressure log reviewed.  Relatively stable.  ? Palpitations  ?  Has had both PAT and PAF.  On standing dose diltiazem.  Still having frequent episodes.  Not longer than 10 to 15 minutes.  ? ?=================================== ? ?ASSESSMENT/PLAN  ? ?Problem List Items Addressed This Visit   ? ?  ? Cardiology Problems  ? PAF (paroxysmal atrial fibrillation) (HCC) - Primary (Chronic)  ?  CHA2DS2-VASc score is 1 because of hypertension.  Not on DOAC, but low threshold to consider adding based on the event of recurrence there is. ? ?Still quite symptomatic with brief episodes of either PAF or PAT. ? ?Plan: ?Continue diltiazem CD 120 mg daily ?Start flecainide 25 mg twice daily (1/2 tablet of 50 mg daily) ?Continue current plans for breakthrough ? ? ? When you have  episode - heart rate and blood pressure elevates  ? Take 1/2 tablet of  50 mg Flecainide  and 1/2 tablet of propranolol 20 mg ?.    You may take  another 1/2 tablet in blood pressure is still elevated  after 2- 3 hours ? The next time you have an episode start off taking 1/2 tablet of Flecainide  50 mg and whole tablet of Pranpranolol ?  ?  ? Relevant Medications  ? ASPIRIN 81 PO  ? sildenafil (VIAGRA) 100 MG tablet  ? flecainide (TAMBOCOR) 50 MG tablet  ? Other Relevant Orders  ? EKG 12-Lead (Completed)  ? Essential hypertension (Chronic)  ?  BP seems to be stable on current dose of diltiazem and valsartan. ? ?He does have breakthrough spells of hypotension associated with pain and palpitations.  Which is PRN propranolol if blood pressure is sustained ?  ?  ? Relevant Medications  ? ASPIRIN 81 PO  ? sildenafil (VIAGRA) 100 MG tablet  ? flecainide (TAMBOCOR) 50 MG tablet  ? Hyperlipidemia due to dietary fat intake (Chronic)  ?  Relevant Medications  ? ASPIRIN 81 PO  ? sildenafil (VIAGRA) 100 MG tablet  ? flecainide (TAMBOCOR) 50 MG tablet  ? PAT (paroxysmal atrial tachycardia) (HCC) (Chronic)  ?  As per PAF ?.->  Continue basal dose of diltiazem ?Change flecainide to 25 mg twice daily basis with continued breakthrough dosing as previously proposed: 50 mg x 1 along with propranolol 10 mg ?Convert to 50 mg doses ?  ?  ? Relevant Medications  ? ASPIRIN 81 PO  ? sildenafil (VIAGRA) 100 MG tablet  ? flecainide (TAMBOCOR) 50 MG tablet  ? Other Relevant Orders  ? EKG 12-Lead (Completed)  ? RESOLVED: Hyperlipidemia associated with type 2 diabetes mellitus (HCC) (Chronic)  ? Relevant Medications  ? ASPIRIN 81 PO  ? sildenafil (VIAGRA) 100 MG tablet  ? flecainide (TAMBOCOR) 50 MG tablet  ?  ? Other  ? Obstructive sleep apnea (Chronic)  ?  Continue to stress the importance of CPAP use ?  ?  ? Relevant Orders  ? EKG 12-Lead (Completed)  ? Morbid obesity ([place]) (Chronic)  ?  Is lost little weight.  I congratulated this effort and encouraged her to continue working on it. ?  ?  ? ? ?=================================== ? ?HPI:   ? ?Joshua Stanton is a 61 y.o. morbidly obese male with a PMH notable for PAF, PAT with labile  BP, OSA on CPAP who presents today for 73-monthfollow-up with me, but 1 month follow-up since CVRR BP evaluation. ?a is being seen today t the request of HShirline Frees MD. ? ?I last saw BFILOMENO CROMLEYin person on May 07, 2021 and then virtually via telehealth on June 20, 2021.  Still having breakthrough spells of tachycardia.  Using diltiazem 129 daily along with flecainide and metoprolol (one half tab both) PRN.  Episodes seem to be resolving around back pain and anxiety. => No changes made.  Have him follow-up with EP. ? ?He was seen by Dr. CCurt Bears(EP) on August 09[facility] 2021[facility] and [facility].  Doing well otherwise when not having these  spells, but very symptomatic when he does.  Was on diltiazem and PRN flecainide.  Still having intermittent palpitations but better than they were previously.  These episodes are usually associated with back pain.  He takes flecainide plus metoprolol.  Noted fatigue thought to be secondary to low testosterone. => No medication changes made.  Recommended continued use of CPAP. ? ?Recent Hospitalizations:  ?08/30/2021: Robotic-lAparoscopic Radical Prostatectomy ?Seen in the ER on 2/18 and 2/19 for problems with urinary catheter being clogged. ?Was in Rapid A Flutter ?Had intermittent leg swelling and discomfort on March 10 ? ?[place] => noted increased spikes of heart rate and blood pressure associated with anxiety and pain.  Episodes usually last an hour an hour and a half.  He usually takes the 1/2 tablet of flecainide (51) and 12.5 mg metoprolol. => Episodes got worse when he had obstruction of the urinary catheter.  Was not taking a full 240 mg diltiazem ?Converted from metoprolol to propranolol for as needed use.  Recommended 10 mg propranolol first.  Increase to 20 mg once he is able to tolerate. ? ?Reviewed  CV studies:   ? ?The following studies were reviewed today: (if available, images/films reviewed: From Epic Chart or Care Everywhere) ?None: ? ? ?Interval History:  ? ?BZayin ValadezFunderburk returns here today for follow-up stating that he still having palpitations but thinks things are little bit better.  He forwarded his BP logs with heart rate ranges and symptoms.  Blood pressure actually not that bad.  He did not get them any recordings of SBP greater than 150s. ? ?He says overall, his palpitations seem to be improved because his back pain seems to doing a little better.  He is try to get out this walking couple times a day just to loosen things up. ? ?Unfortunate, he still is having almost daily palpitations which are still relatively  short-lived.  It is very hard to tell if he is having A-fib or just PAT. ? ?He seems to be doing pretty well after his prostate surgery.  That seems to have helped out from his back pain. ? ?He never really tolerated the higher doses of flecainide or metoprolol for PRN episodes. ? ?CV Review of Symptoms (Summary) ?Cardiovascular ROS: no chest pain or dyspnea on exertion ?positive for - irregular heartbeat, palpitations, rapid heart rate, and these episodes are associated with some chest discomfort and dyspnea, but not without.  Exertional dyspnea due to deconditioning and obesity.  Improved with increasing activity.. ?negative for - chest pain, edema, orthopnea, paroxysmal nocturnal dyspnea, shortness of breath, or syncope/near syncope or TIA/amaurosis fugax claudication ? ?Blood pressure log reviewed. ?  ? ?REVIEWED OF  SYSTEMS  ? ?Review of Systems  ?Constitutional:  Positive for malaise/fatigue (Energy level is better now has been walking) and weight loss (He has made a conscious effort to try to change his diet and exercise, he is happy that his weight is down 7 pounds since last visit.  Hoping to continue this.).  ?HENT:  Negative for nosebleeds.   ?Respiratory:  Positive for shortness of breath (Only with tachycardia).   ?Cardiovascular:  Positive for leg swelling (Trivial).  ?     Otherwise per HPI  ?Gastrointestinal:  Negative for blood in stool and melena.  ?Genitourinary:  Positive for frequency (Has some issues with incontinence following his prostate surgery) and urgency. Negative for dysuria (Only when he had to have a Foley unclogged) and hematuria.  ?Musculoskeletal:  Positive for back pain and joint pain.  ?Neurological:  Positive for tingling (Radicular pain from back). Negative for dizziness, tremors, weakness and headaches.  ?Psychiatric/Behavioral:  Negative for depression and memory loss. The patient is nervous/anxious. The patient does not have insomnia.   ? ?I have reviewed and (if needed)  personally updated the patient's problem list, medications, allergies, past medical and surgical history, social and family history.  ? ?PAST MEDICAL HISTORY  ? ?Past Medical History:  ?Diagnosis Date  ? [place]

## 2021-10-17 NOTE — Patient Instructions (Addendum)
Medication Instructions:  ? Standing dose of Dr [name] flecainide 25 mg ( 1/2 tablet of 50 mg)  twice a day  ? ? ?Still follow  the instruction for breakthrough episode   ? When you have  episode - heart rate and blood pressure elevates  ? Take 1/2 tablet of  50 mg Flecainide  and 1/2 tablet of propranolol 20 mg ?.    You may take  another 1/2 tablet in blood pressure is still elevated  after 2- 3 hours ? The next time you have an episode start off taking 1/2 tablet of Flecainide  50 mg and whole tablet of Pranpranolol ? ?*If you need a refill on your cardiac medications before your next appointment, please call your pharmacy* ? ? ?Lab Work: ?Not needed ? ? ? ?Testing/Procedures: ? ?Not needed ? ?Follow-Up: ?At [facility], you and your health needs are our priority.  As part of our continuing mission to provide you with exceptional heart care, we have created designated Provider Care Teams.  These Care Teams include your primary Cardiologist (physician) and Advanced Practice Providers (APPs -  Physician Assistants and Nurse Practitioners) who all work together to provide you with the care you need, when you need it. ? ?  ? ?Your next appointment:   ?6 week(s) ? ?The format for your next appointment:   ?In Person ? ?Provider:   ?Glenetta Hew, MD   ? ?Your physician recommends that you schedule a follow-up appointment in: 6 month with Dr [name]  ? ? ? ?Other Instructions  ?

## 2021-10-22 ENCOUNTER — Encounter: Payer: Self-pay | Admitting: Cardiology

## 2021-10-22 NOTE — Assessment & Plan Note (Addendum)
CHA2DS2-VASc score is 1 because of hypertension.  Not on DOAC, but low threshold to consider adding based on the event of recurrence there is. ? ?Still quite symptomatic with brief episodes of either PAF or PAT. ? ?Plan: ?? Continue diltiazem CD 120 mg daily ?? Start flecainide 25 mg twice daily (1/2 tablet of 50 mg daily) ?? Continue current plans for breakthrough ? ? ? When you have  episode - heart rate and blood pressure elevates  ? Take 1/2 tablet of  50 mg Flecainide  and 1/2 tablet of propranolol 20 mg ?.    You may take  another 1/2 tablet in blood pressure is still elevated  after 2- 3 hours ? The next time you have an episode start off taking 1/2 tablet of Flecainide  50 mg and whole tablet of Pranpranolol ?

## 2021-10-22 NOTE — Assessment & Plan Note (Deleted)
Lipids look much better.  Change in diet and exercise in a different. ?

## 2021-10-22 NOTE — Assessment & Plan Note (Signed)
BP seems to be stable on current dose of diltiazem and valsartan. ? ?He does have breakthrough spells of hypotension associated with pain and palpitations.  Which is PRN propranolol if blood pressure is sustained ?

## 2021-10-22 NOTE — Assessment & Plan Note (Signed)
Is lost little weight.  I congratulated this effort and encouraged her to continue working on it. ?

## 2021-10-22 NOTE — Assessment & Plan Note (Signed)
Continue to stress the importance of CPAP use ?

## 2021-10-22 NOTE — Assessment & Plan Note (Signed)
As per PAF ?.->  Continue basal dose of diltiazem ?? Change flecainide to 25 mg twice daily basis with continued breakthrough dosing as previously proposed: 50 mg x 1 along with propranolol 10 mg ?? Convert to 50 mg doses ?

## 2021-11-26 ENCOUNTER — Telehealth: Payer: Self-pay | Admitting: *Deleted

## 2021-11-26 NOTE — Telephone Encounter (Signed)
Patient returned RN's call. Advised pt of documentation in appt notes stating he will be contacted at 11:20 AM instead of 10:00 AM. Patient verbalized understanding.  ?

## 2021-11-26 NOTE — Telephone Encounter (Signed)
LEFT MESSAGE FOR PATIENT TO CALL BACK - IN REGARDS TO UPCOMING APPOINTMENT ON 11/28/21 WITH DR Dover. ? ?

## 2021-11-27 NOTE — Telephone Encounter (Signed)
Patient received message from scheduler voiced understanding. ?

## 2021-11-28 ENCOUNTER — Encounter: Payer: Self-pay | Admitting: Cardiology

## 2021-11-28 ENCOUNTER — Telehealth (INDEPENDENT_AMBULATORY_CARE_PROVIDER_SITE_OTHER): Payer: 59 | Admitting: Cardiology

## 2021-11-28 VITALS — BP 124/60 | HR 64 | Ht 67.0 in | Wt 255.0 lb

## 2021-11-28 DIAGNOSIS — I10 Essential (primary) hypertension: Secondary | ICD-10-CM | POA: Diagnosis not present

## 2021-11-28 DIAGNOSIS — I480 Paroxysmal atrial fibrillation: Secondary | ICD-10-CM

## 2021-11-28 DIAGNOSIS — I471 Supraventricular tachycardia: Secondary | ICD-10-CM

## 2021-11-28 DIAGNOSIS — G4733 Obstructive sleep apnea (adult) (pediatric): Secondary | ICD-10-CM | POA: Diagnosis not present

## 2021-11-28 NOTE — Patient Instructions (Signed)
Medication Instructions:  ? ?  Change in taking Flecainide-- try and take 25 mg in the morning and 50 mg in the evening for one week if you can tolerate then increase to 50 mg twice a day. ? ?*If you need a refill on your cardiac medications before your next appointment, please call your pharmacy* ? ? ?Lab Work: ?Not needed ? ? ? ?Testing/Procedures: ?Not needed ? ? ?Follow-Up: ?At [facility], you and your health needs are our priority.  As part of our continuing mission to provide you with exceptional heart care, we have created designated Provider Care Teams.  These Care Teams include your primary Cardiologist (physician) and Advanced Practice Providers (APPs -  Physician Assistants and Nurse Practitioners) who all work together to provide you with the care you need, when you need it. ? ?  ? ?Your next appointment:   ?    Discussion will be made after your visit with [place] ? ?The format for your next appointment:   ?In Person ? ?Provider:   ?Glenetta Hew, MD  ? ? ?Other Instructions  ? Informed Dr [name] on your upcoming appointment at [place] ?

## 2021-11-28 NOTE — Assessment & Plan Note (Addendum)
Doing better with the same dose of diltiazem and 25 mg twice daily flecainide.  Still having every use the PRN 50 mg flecainide plus propranolol 10 mg for breakthrough at least once or twice a week.  Would like to try to get away from that. ? ?Plan: Gradually titrate up to 50 mg twice daily flecainide.  Start off with 1 week taking 50 mg in the evening and 25 mg in the morning.  If tolerated after 1 week, then increase to 50 mg twice daily.  Continue with other planned diltiazem and breakthrough therapy as previously scheduled. ? ?He has an appointment on June 5 with [facility] EP for second opinion.  I welcome this additional help since he has been very difficult to handle. ? ?We will await the outcome of this visit before scheduling a follow-up appointment with me. ?

## 2021-11-28 NOTE — Assessment & Plan Note (Signed)
Labile blood pressures.  However for the most part pretty well controlled.  He is on valsartan and diltiazem as standing medications along with as needed propranolol. ?He has had some hypotension issues and therefore would not be more aggressive.  For the most part pressures and his pressure log looked to be in the relatively normal zone.  I do suspect that we will be able to titrate up further as time goes by. ?

## 2021-11-28 NOTE — Assessment & Plan Note (Signed)
CHA2DS2-VASc score 1 due to hypertension.  Not on DOAC.  Not convinced that he is having true breakthrough spells of A-fib since his episodes are relatively short. ? ?Difficult pelvis PAF repeat AT. ?Plan will be the same: ?? Continue diltiazem 120 mg daily and titrate up to 50 mg twice daily flecainide. ?? Continue breakthrough flecainide 50 mg plus propranolol 10 mg. ?

## 2021-11-28 NOTE — Assessment & Plan Note (Signed)
Continue to use CPAP 

## 2021-11-28 NOTE — Progress Notes (Signed)
? ?Virtual Visit via Video Note  ? ?This visit type was conducted due to national recommendations for restrictions regarding the COVID-19 Pandemic (e.g. social distancing) in an effort to limit this patient's exposure and mitigate transmission in our community.  Due to his co-morbid illnesses, this patient is at least at moderate risk for complications without adequate follow up.  This format is felt to be most appropriate for this patient at this time.  All issues noted in this document were discussed and addressed.  A limited physical exam was performed with this format.  Please refer to the patient's chart for his consent to telehealth for [facility]. ? ?   ? ?Patient has given verbal permission to conduct this visit via virtual appointment and to bill insurance 11/28/2021 4:07 PM    ? ?Evaluation Performed:  Follow-up visit ? ?Date:  11/28/2021  ? ?ID:  Joshua Stanton, DOB 1961-04-02, MRN [id number] ? ?Patient Location: Home ?Provider Location: Office/Clinic ? ?PCP:  [name], MD  ?Cardiologist:  Glenetta Hew, MD  ?Electrophysiologist:  Thompson Grayer, MD  ? ?Chief Complaint:   ?Chief Complaint  ?Patient presents with  ? Follow-up  ?  Discussed effect of standing dose of flecainide.  ? ? ?==================================== ? ?ASSESSMENT & PLAN:   ? ?Problem List Items Addressed This Visit   ? ?  ? Cardiology Problems  ? PAF (paroxysmal atrial fibrillation) (HCC) - Primary (Chronic)  ?  CHA2DS2-VASc score 1 due to hypertension.  Not on DOAC.  Not convinced that he is having true breakthrough spells of A-fib since his episodes are relatively short. ? ?Difficult pelvis PAF repeat AT. ?Plan will be the same: ?Continue diltiazem 120 mg daily and titrate up to 50 mg twice daily flecainide. ?Continue breakthrough flecainide 50 mg plus propranolol 10 mg. ?  ?  ? Relevant Medications  ? atorvastatin (LIPITOR) 40 MG tablet  ? propranolol (INDERAL) 20 MG tablet  ? flecainide (TAMBOCOR) 100 MG tablet  ?  Essential hypertension (Chronic)  ?  Labile blood pressures.  However for the most part pretty well controlled.  He is on valsartan and diltiazem as standing medications along with as needed propranolol. ?He has had some hypotension issues and therefore would not be more aggressive.  For the most part pressures and his pressure log looked to be in the relatively normal zone.  I do suspect that we will be able to titrate up further as time goes by. ? ?  ?  ? Relevant Medications  ? atorvastatin (LIPITOR) 40 MG tablet  ? propranolol (INDERAL) 20 MG tablet  ? flecainide (TAMBOCOR) 100 MG tablet  ? PAT (paroxysmal atrial tachycardia) (HCC) (Chronic)  ?  Doing better with the same dose of diltiazem and 25 mg twice daily flecainide.  Still having every use the PRN 50 mg flecainide plus propranolol 10 mg for breakthrough at least once or twice a week.  Would like to try to get away from that. ? ?Plan: Gradually titrate up to 50 mg twice daily flecainide.  Start off with 1 week taking 50 mg in the evening and 25 mg in the morning.  If tolerated after 1 week, then increase to 50 mg twice daily.  Continue with other planned diltiazem and breakthrough therapy as previously scheduled. ? ?  ?  ? Relevant Medications  ? atorvastatin (LIPITOR) 40 MG tablet  ? propranolol (INDERAL) 20 MG tablet  ? flecainide (TAMBOCOR) 100 MG tablet  ?  ? Other  ? Obstructive sleep  apnea (Chronic)  ?  Continue to use CPAP ? ?  ?  ? ? ?==================================== ? ?History of Present Illness:   ? ?Joshua Stanton is a 61 y.o. male with PMH notable for PAF, PAT with labile BP, OSA on CPAP who presents via audio/video conferencing for a telehealth visit today as a 6-week follow-up to reassess effect of medication changes.. ? ?Joshua Stanton was last seen October 17, 2021: Still loading intermittent palpitations but a little bit better.  We looked at his BP logs and heart rate ranges.  Blood pressure actually doing pretty well.  Had  not had that many recordings over 150 SBP.  Palpitations were improved but also his back pain was improved.  Trending down his walking.  Still having daily palpitations just relatively short-lived.  Unfortunately was unable to tolerate higher dose of flecainide.  Noting fatigue and malaise.  Trying to lose weight.  Gets short of breath with his tachycardia spells.  Trivial leg swelling.  Quite anxious. ?Started 25 mg twice daily flecainide as a standing medication; along with diltiazem 120 mg daily.  Continue current combination of propranolol +50 mg flexion breakthrough spells. ? ?Hospitalizations:  ?None ? ? ?Recent - Interim CV studies:   ?The following studies were reviewed today: ?None: ? ?Inerval History  ? ?Joshua Stanton is being seen today via telemedicine to follow-up.  He said in a long list of blood pressures and episodes.  A lot of "feeling bad spells racing like he is having palpitations.  At least once or twice a week having spells where he actually had to take a flecainide and propranolol.  Otherwise able to we do not.  Clearly has back pain exacerbations make it worse.  He still has this fatigue but is otherwise doing okay on flecainide.  He says that his energy levels are down but since his prostate surgery they are working on keeping his testosterone level extremely low and that he clearly has been fatigued and worn out and that may be why he is feeling so weak and tired.  He does get a little lightheaded when his heart rate goes up but otherwise did not lasting long appear to be overly symptomatic.  Overall the standing dose of flecainide seems to stabilize things a little bit. ? ?Cardiovascular ROS: no chest pain or dyspnea on exertion ?positive for - irregular heartbeat, palpitations, rapid heart rate, and the spells are associated with lightheadedness dyspnea and even the medial little chest discomfort, mostly anxiety.  He is deconditioned and therefore has some exertional dyspnea, nothing  changed. ?negative for - edema, orthopnea, paroxysmal nocturnal dyspnea, shortness of breath, or syncope or near syncope.  TIA or amaurosis fugax, claudication ? ? ?ROS:  Please see the history of present illness.    ? ?Review of Systems  ?Constitutional:  Positive for malaise/fatigue (Really does not have a lot of energy.  But this has been since prostate surgery with no testosterone supplementation.). Negative for weight loss.  ?HENT:  Negative for congestion and nosebleeds.   ?Respiratory:  Negative for cough and wheezing.   ?Cardiovascular:   ?     Per HPI  ?Gastrointestinal:  Negative for blood in stool, constipation and melena.  ?Genitourinary:  Negative for hematuria.  ?Musculoskeletal:  Positive for back pain. Negative for falls.  ?Neurological:  Positive for dizziness (With tachycardia) and tingling (Some radicular pain from his back). Negative for speech change and focal weakness.  ?Psychiatric/Behavioral:  Negative for depression and memory loss. The  patient is nervous/anxious. The patient does not have insomnia.   ? ?Past Medical History:  ?Diagnosis Date  ? Arthritis   ? "lower back" (09/28/2015)  ? Atypical angina ([place]) dx'd 09/2015  ? GERD (gastroesophageal reflux disease)   ? High cholesterol   ? History of hiatal hernia   ? Hypertension   ? OSA on CPAP   ? PAF (paroxysmal atrial fibrillation) ([place]) 01/04/2021  ? Prediabetes   ? Seasonal allergies   ? ?Past Surgical History:  ?Procedure Laterality Date  ? ANKLE HARDWARE REMOVAL Left 2006  ? left   ? BIOPSY  03/09/2019  ? Procedure: BIOPSY;  Surgeon: Wilford Corner, MD;  Location: [facility] ENDOSCOPY;  Service: Endoscopy;;  ? CARDIAC CATHETERIZATION N/A 09/28/2015  ? Procedure: Left Heart Cath and Coronary Angiography;  Surgeon: Wellington Hampshire, MD;  Location: [place] [facility];  Service: Cardiovascular;: For possible atypical angina, mildly elevated troponins.  Angiographically normal coronary arteries  ? CARDIAC EVENT MONITOR  09/2018  ? Heart rate  range 47 bpm - 190 bpm.  Average heart rate 70 bpm.  Low burden of PACs and PVCs.  3 short runs of SVT/PAT with aberrancy versus V. tach.  25 bursts of PAT 3 episodes of what appear to be atrial tach versus atrial fib wit

## 2021-11-29 ENCOUNTER — Other Ambulatory Visit: Payer: Self-pay | Admitting: Cardiology

## 2022-01-10 ENCOUNTER — Telehealth: Payer: Self-pay | Admitting: Cardiology

## 2022-01-10 ENCOUNTER — Encounter: Payer: Self-pay | Admitting: Cardiology

## 2022-01-10 NOTE — Telephone Encounter (Signed)
Pt state that he put a link in his MyChart of his Heart Monitor results so that Dr. [name] is able to see then. Pt would like a call back. Please advise

## 2022-01-13 NOTE — Telephone Encounter (Signed)
Monitor reviewed.   The predominant rhythm is sinus rhythm with first-degree AV block and a bundle branch block. There is 3% burden of atrial fibrillation/flutter (which looks a lot like flutter)-with a heart rate ranging from 59-206 beats a minute and average of to 127 beats minute.  The longest was 56:43 minutes with an average rate of 160.  Otherwise there were rare PACs and PVCs noted with some couplets and triplets.  There were 28 triggered events and 40 diary events.   It does appear that many of these episodes were associated with either atrial fibrillation or atrial flutter.  Dr. Joan Mayans was in the mindset that potentially since he has failed flecainide, that we consider ablation.  I think it would mean ablation of both atrial fibrillation and atrial flutter because he is got a combination of both.  He is due to see Dr. Curt Bears back soon, I think it is up to Mr. Si if he wants to proceed with care between 2 EP doctors.  As a general cardiologist, I am only the conduit of information.  Glenetta Hew, MD

## 2022-01-28 ENCOUNTER — Other Ambulatory Visit: Payer: Self-pay | Admitting: Cardiology

## 2022-02-06 ENCOUNTER — Encounter: Payer: Self-pay | Admitting: Cardiology

## 2022-02-11 NOTE — Telephone Encounter (Signed)
I will try to get to this form, but I am trying to get as much work done as possible prior to going on vacation for the next 3 weeks starting on Friday afternoon.  It all depends if I have time to get this.  If not I will do it as soon as I get back.  Glenetta Hew, MD

## 2022-02-18 ENCOUNTER — Encounter: Payer: Self-pay | Admitting: Cardiology

## 2022-02-19 ENCOUNTER — Ambulatory Visit (INDEPENDENT_AMBULATORY_CARE_PROVIDER_SITE_OTHER): Payer: 59 | Admitting: Cardiology

## 2022-02-19 ENCOUNTER — Encounter: Payer: Self-pay | Admitting: Cardiology

## 2022-02-19 VITALS — BP 116/78 | HR 65 | Ht 67.0 in | Wt 262.2 lb

## 2022-02-19 DIAGNOSIS — I471 Supraventricular tachycardia: Secondary | ICD-10-CM

## 2022-02-19 DIAGNOSIS — I480 Paroxysmal atrial fibrillation: Secondary | ICD-10-CM | POA: Diagnosis not present

## 2022-02-19 DIAGNOSIS — I10 Essential (primary) hypertension: Secondary | ICD-10-CM

## 2022-02-19 MED ORDER — PROPAFENONE HCL ER 325 MG PO CP12: 325.0000 mg | ORAL_CAPSULE | Freq: Two times a day (BID) | ORAL | 11 refills | Status: DC

## 2022-02-19 NOTE — Progress Notes (Signed)
Electrophysiology Office Note   Date:  02/19/2022   ID:  Joshua Stanton, DOB [birth year], MRN [id number]  PCP:  Joshua Frees, MD  Cardiologist:  Joshua Stanton Primary Electrophysiologist:  Joshua [name] Leeds, MD    Chief Complaint: AF   History of Present Illness: Joshua Stanton is a 61 y.o. male who is being seen today for the evaluation of AF at the request of Joshua Frees, MD. Presenting today for electrophysiology evaluation.  He has a history significant for hypertension, obstructive sleep apnea on CPAP, paroxysmal atrial fibrillation, prediabetes.  He had a cardia mobile app that showed episodes of atrial fibrillation and atrial tachycardia.  He is on as needed flecainide.  Today, denies symptoms of palpitations, chest pain, shortness of breath, orthopnea, PND, lower extremity edema, claudication, dizziness, presyncope, syncope, bleeding, or neurologic sequela. The patient is tolerating medications without difficulties.  He is overall feeling well.  He does have continued episodes of atrial fibrillation.  He feels that high doses of caffeine such as drinking coffee do affect his atrial fibrillation.  He is cut down on his coffee intake and has had an improvement in his burden.  Despite that, he is continued to have episodes    Past Medical History:  Diagnosis Date   Arthritis    "lower back" (09/28/2015)   Atypical angina ([place]) dx'd 09/2015   GERD (gastroesophageal reflux disease)    High cholesterol    History of hiatal hernia    Hypertension    OSA on CPAP    PAF (paroxysmal atrial fibrillation) ([place]) 01/04/2021   Prediabetes    Seasonal allergies    Past Surgical History:  Procedure Laterality Date   ANKLE HARDWARE REMOVAL Left 2006   left    BIOPSY  03/09/2019   Procedure: BIOPSY;  Surgeon: Joshua Corner, MD;  Location: [facility];  Service: Endoscopy;;   CARDIAC CATHETERIZATION N/A 09/28/2015   Procedure: Left Heart Cath and Coronary  Angiography;  Surgeon: Joshua Hampshire, MD;  Location: [facility];  Service: Cardiovascular;: For possible atypical angina, mildly elevated troponins.  Angiographically normal coronary arteries   CARDIAC EVENT MONITOR  09/2018   Heart rate range 47 bpm - 190 bpm.  Average heart rate 70 bpm.  Low burden of PACs and PVCs.  3 short runs of SVT/PAT with aberrancy versus V. tach.  25 bursts of PAT 3 episodes of what appear to be atrial tach versus atrial fib with a rate of 190 bpm    COLONOSCOPY WITH PROPOFOL N/A 03/09/2019   Procedure: COLONOSCOPY WITH PROPOFOL;  Surgeon: Joshua Corner, MD;  Location: [facility];  Service: Endoscopy;  Laterality: N/A;   ESOPHAGOGASTRODUODENOSCOPY (EGD) WITH PROPOFOL N/A 03/09/2019   Procedure: ESOPHAGOGASTRODUODENOSCOPY (EGD) WITH PROPOFOL;  Surgeon: Joshua Corner, MD;  Location: [facility];  Service: Endoscopy;  Laterality: N/A;   KNEE ARTHROSCOPY  09/20/2011   Procedure: ARTHROSCOPY KNEE;  Surgeon: Joshua Alf, MD;  [facility];  Service: Orthopedics;  Laterality: Right;  Medial meniscal DEBRIDEMENT with chondroplasty, right knee   KNEE ARTHROSCOPY Left 04/07/2013   Procedure: LEFT KNEE ARTHROSCOPY WITH MEDIAL MENISCUS  DEBRIDEMENT ;  Surgeon: Joshua Alf, MD;  Location: [facility];  Service: Orthopedics;  Laterality: Left;   LIPOMA EXCISION Right 2004   neck   LYMPHADENECTOMY Bilateral 08/30/2021   Procedure: LYMPHADENECTOMY, PELVIC;  Surgeon: Joshua Bring, MD;  Location: [facility];  Service: Urology;  Laterality: Bilateral;   [place]  ORIF ANKLE FRACTURE Left 1981   ROBOT ASSISTED LAPAROSCOPIC RADICAL PROSTATECTOMY N/A 08/30/2021   Procedure: XI ROBOTIC ASSISTED LAPAROSCOPIC RADICAL PROSTATECTOMY LEVEL 3;  Surgeon: Joshua Bring, MD;  Location: [facility];  Service: Urology;  Laterality: N/A;   TONSILLECTOMY  2008   TRANSTHORACIC ECHOCARDIOGRAM  02/28/2021   EF 60 to 65%.  No R WMA.  Diastolic  function, but unable to quantify.  Normal RV.  Normal valves.   UVULOPALATOPHARYNGOPLASTY  2008     Current Outpatient Medications  Medication Sig Dispense Refill   ASPIRIN 81 PO Take 81 mg by mouth daily.     atorvastatin (LIPITOR) 40 MG tablet Take 40 mg by mouth every other day.     diltiazem (CARDIZEM CD) 120 MG 24 hr capsule Take 1 capsule (120 mg total) by mouth daily. Call the office if tolerating medication Tanazia Achee change to 240 mg tablet daily 60 capsule 3   gabapentin (NEURONTIN) 300 MG capsule Take 300-600 mg by mouth 2 (two) times daily as needed (pain).     LORazepam (ATIVAN) 1 MG tablet Take 1 mg by mouth daily as needed for anxiety.      magnesium gluconate (MAGONATE) 500 MG tablet Take 500 mg by mouth at bedtime.     Melatonin 10 MG TABS Take 1 tablet by mouth at bedtime.     propafenone (RYTHMOL SR) 325 MG 12 hr capsule Take 1 capsule (325 mg total) by mouth 2 (two) times daily. 60 capsule 11   propranolol (INDERAL) 20 MG tablet TAKE 1/2- 1 TABLET BY MOUTH AS NEEDED FOR PALPITATIONS 90 tablet 3   sildenafil (VIAGRA) 100 MG tablet Take 100 mg by mouth daily.     valsartan (DIOVAN) 320 MG tablet TAKE 1 TABLET(320 MG) BY MOUTH DAILY 30 tablet 4   No current facility-administered medications for this visit.    Allergies:   Carvedilol, Hydrocodone-acetaminophen, Oxycodone, and Trazodone hcl   Social History:  The patient  reports that he has never smoked. He quit smokeless tobacco use about 20 years ago.  His smokeless tobacco use included chew. He reports that he does not currently use alcohol. He reports that he does not currently use drugs after having used the following drugs: Marijuana.   Family History:  The patient's family history includes Dementia in his mother; Hypertension in his mother.    ROS:  Please see the history of present illness.   Otherwise, review of systems is positive for none.   All other systems are reviewed and negative.   PHYSICAL EXAM: VS:  BP  116/78   Pulse 65   Ht '5\' 7"'$  (1.702 m)   Wt 262 lb 3.2 oz (118.9 kg)   SpO2 95%   BMI 41.07 kg/m  , BMI Body mass index is 41.07 kg/m. GEN: Well nourished, well developed, in no acute distress  HEENT: normal  Neck: no JVD, carotid bruits, or masses Cardiac: RRR; no murmurs, rubs, or gallops,no edema  Respiratory:  clear to auscultation bilaterally, normal work of breathing GI: soft, nontender, nondistended, + BS MS: no deformity or atrophy  Skin: warm and dry Neuro:  Strength and sensation are intact Psych: euthymic mood, full affect  EKG:  EKG is not ordered today. Personal review of the ekg ordered 10/17/21 shows sinus rhythm, LVH, rate 63  Recent Labs: 08/27/2021: Platelets 336 08/31/2021: Hemoglobin 12.1 09/12/2021: ALT 27; BUN 9; Creatinine, Ser 1.07; Magnesium 2.4; Potassium 4.4; Sodium 143    Lipid Panel     Component  Value Date/Time   CHOL 184 09/21/2015 0915   TRIG 106 09/21/2015 0915   HDL 38 (L) 09/21/2015 0915   CHOLHDL 4.8 09/21/2015 0915   VLDL 21 09/21/2015 0915   LDLCALC 125 (H) 09/21/2015 0915     Wt Readings from Last 3 Encounters:  02/19/22 262 lb 3.2 oz (118.9 kg)  11/28/21 255 lb (115.7 kg)  10/17/21 258 lb 9.6 oz (117.3 kg)      Other studies Reviewed: Additional studies/ records that were reviewed today include: TTE 02/28/21  Review of the above records today demonstrates:   1. Left ventricular ejection fraction, by estimation, is 60 to 65%. The  left ventricle has normal function. The left ventricle has no regional  wall motion abnormalities. The left ventricular internal cavity size was  mildly dilated. Left ventricular  diastolic parameters are indeterminate. Elevated left ventricular  end-diastolic pressure.   2. Right ventricular systolic function is normal. The right ventricular  size is normal. Tricuspid regurgitation signal is inadequate for assessing  PA pressure.   3. The mitral valve is normal in structure. Mild mitral valve   regurgitation. No evidence of mitral stenosis.   4. The aortic valve is normal in structure. Aortic valve regurgitation is  not visualized. No aortic stenosis is present.   5. Aortic dilatation noted. There is mild dilatation of the ascending  aorta, measuring 40 mm.   6. The inferior vena cava is normal in size with greater than 50%    ASSESSMENT AND PLAN:  1.  Paroxysmal atrial fibrillation/atrial tachycardia: Currently on diltiazem to 40 mg daily, flecainide 100 mg as needed, metoprolol 25 mg as needed.  CHA2DS2-VASc of 1 and thus not anticoagulated.  He would benefit from a rhythm control strategy.  He would prefer to avoid ablation at this point.  Due to that, we Chantae Soo stop his flecainide as it is making him feel weak and fatigued.  Andri Prestia start him on Rythmol 325 mg twice daily.    2.  Hypertension: Currently well-controlled  3.  Obstructive sleep apnea: CPAP compliance encouraged  Current medicines are reviewed at length with the patient today.   The patient does not have concerns regarding his medicines.  The following changes were made today: None  Labs/ tests ordered today include:  No orders of the defined types were placed in this encounter.    Disposition:   FU 3 months  Signed, Nickalaus Crooke Joshua Leeds, MD  02/19/2022 3:57 PM     [facility] [address] [place] [address] [phone] (office) [phone] (fax)

## 2022-02-19 NOTE — Patient Instructions (Addendum)
Medication Instructions:  Your physician has recommended you make the following change in your medication:    STOP flecainide  2.    Start Rhythmol SR 325 mg-  Take one tablet by mouth twice a day  Labwork: None ordered.  Testing/Procedures: None ordered.  Follow-Up:  You will have a nurse visit in 2 weeks for an EKG  Your physician wants you to follow-up in: 3 months with the Afib clinic.   Any Other Special Instructions Will Be Listed Below (If Applicable).  If you need a refill on your cardiac medications before your next appointment, please call your pharmacy.    Propafenone Extended-Release Capsules What is this medication? PROPAFENONE (proe pa FEEN one) prevents and treats a fast or irregular heartbeat (arrhythmia). It is often used to treat a type of arrhythmia known as AFib (atrial fibrillation). It works by slowing down overactive electric signals in the heart, which stabilizes your heart rhythm. It belongs to a group of medications called antiarrhythmics. This medicine may be used for other purposes; ask your health care provider or pharmacist if you have questions. COMMON BRAND NAME(S): Rythmol SR What should I tell my care team before I take this medication? They need to know if you have any of these conditions: Heart disease High potassium level Kidney disease Liver disease Low blood pressure Lung or breathing disease like asthma, chronic bronchitis, or emphysema Pacemaker Slow heart rate An unusual or allergic reaction to propafenone, other medications, foods, dyes, or preservatives Pregnant or trying to get pregnant Breast-feeding How should I use this medication? Take this medication by mouth with a glass of water. Follow the directions on the prescription label. Swallow whole. Do not crush or chew. You can take this medication with or without food. Take your doses at regular intervals. Do not take your medication more often than directed. Talk to your  care team regarding the use of this medication in children. Special care may be needed. Overdosage: If you think you have taken too much of this medicine contact a poison control center or emergency room at once. NOTE: This medicine is only for you. Do not share this medicine with others. What if I miss a dose? If you miss a dose, take it as soon as you can. If it is almost time for your next dose, take only that dose. Do not take double or extra doses. What may interact with this medication? Do not take this medication with any of the following: Arsenic trioxide Certain antibiotics like clarithromycin, erythromycin, grepafloxacin, pentamidine, sparfloxacin, troleandomycin Certain medications for depression or mental illness like amoxapine, haloperidol, maprotiline, pimozide, sertindole, thioridazine, tricyclic antidepressants Certain medications for fungal infections like fluconazole, itraconazole, ketoconazole, posaconazole, voriconazole Certain medications for irregular heartbeat like dronedarone Certain medications for malaria like chloroquine, halofantrine Cisapride Droperidol Levomethadyl Ranolazine This medication may also interact with the following: Certain medications for angina or blood pressure Certain medications for asthma or breathing difficulties like formoterol, salmeterol Certain medications that treat or prevent blood clots like warfarin Cimetidine Cyclosporine Digoxin Diuretics Local anesthetics Other medications that prolong the QT interval (cause an abnormal heart rhythm) like dofetilide, ziprasidone Rifampin Ritonavir Theophylline This list may not describe all possible interactions. Give your health care provider a list of all the medicines, herbs, non-prescription drugs, or dietary supplements you use. Also tell them if you smoke, drink alcohol, or use illegal drugs. Some items may interact with your medicine. What should I watch for while using this  medication? Your condition will  be monitored closely when you first begin therapy. Often, this medication is first started in a hospital or other monitored health care setting. Once you are on maintenance therapy, visit your care team for regular checks on your progress. Because your condition and use of this medication carry some risk, it is a good idea to carry an identification card, necklace or bracelet with details of your condition, medications, and care team. You may get drowsy or dizzy. Do not drive, use machinery, or do anything that needs mental alertness until you know how this medication affects you. Do not stand or sit up quickly, especially if you are an older patient. This reduces the risk of dizzy or fainting spells. If you are going to have surgery, tell your care team that you are taking this medication. What side effects may I notice from receiving this medication? Side effects that you should report to your care team as soon as possible: Allergic reactions--skin rash, itching, hives, swelling of the face, lips, tongue, or throat Heart failure--shortness of breath, swelling of the ankles, feet, or hands, sudden weight gain, unusual weakness or fatigue Heart rhythm changes--fast or irregular heartbeat, dizziness, feeling faint or lightheaded, chest pain, trouble breathing Infection--fever, chills, cough, sore throat Unusual bruising or bleeding Side effects that usually do not require medical attention (report to your care team if they continue or are bothersome): Change in taste Constipation Dizziness Fatigue Nausea This list may not describe all possible side effects. Call your doctor for medical advice about side effects. You may report side effects to FDA at 1-800-FDA-1088. Where should I keep my medication? Keep out of the reach of children and pets. Store at room temperature between 15 and 30 degrees C (59 and 86 degrees F). Keep container tightly closed. Throw away any  unused medication after the expiration date. NOTE: This sheet is a summary. It may not cover all possible information. If you have questions about this medicine, talk to your doctor, pharmacist, or health care provider.  2023 Elsevier/Gold Standard (2020-08-25 00:00:00)

## 2022-02-20 NOTE — Addendum Note (Signed)
Addended by: Michelle Nasuti on: 02/20/2022 11:37 AM   Modules accepted: Orders

## 2022-02-23 ENCOUNTER — Other Ambulatory Visit: Payer: Self-pay | Admitting: Internal Medicine

## 2022-03-05 ENCOUNTER — Ambulatory Visit (INDEPENDENT_AMBULATORY_CARE_PROVIDER_SITE_OTHER): Payer: 59 | Admitting: *Deleted

## 2022-03-05 DIAGNOSIS — I480 Paroxysmal atrial fibrillation: Secondary | ICD-10-CM

## 2022-03-05 DIAGNOSIS — I471 Supraventricular tachycardia: Secondary | ICD-10-CM | POA: Diagnosis not present

## 2022-03-05 NOTE — Progress Notes (Unsigned)
   Nurse Visit   Date of Encounter: 03/05/2022 ID: Joshua Stanton, DOB [birth year], MRN [id number]  PCP:  [name], MD   [facility] Providers Cardiologist:  Glenetta Hew, MD { Click to update primary MD,subspecialty MD or APP then REFRESH:1}     Visit Details   VS:  There were no vitals taken for this visit. , BMI There is no height or weight on file to calculate BMI.  Wt Readings from Last 3 Encounters:  02/19/22 262 lb 3.2 oz (118.9 kg)  11/28/21 255 lb (115.7 kg)  10/17/21 258 lb 9.6 oz (117.3 kg)     Reason for visit: EKG post medication change Performed today: EKG--Dr. Lovena Le reviewed Changes (medications, testing, etc.) : Patient was to have started Propafenone 325 mg bid. After reading side effects decided against the medication. Length of Visit: 20 minutes     Medications Adjustments/Labs and Tests Ordered:  Patient did not start the Propafenone as recommended.  He is currently taking Flecainide 25 mg qhs and if he has an episode of what he feels is afib, then he takes an extra dose of Flecainide 25 mg with 10 mg of Propanolol.  Dr. Lovena Le reviewed and let patient know would let Dr. Curt Bears and his nurse know to review.    Signed, Janan Halter, RN  03/05/2022 5:50 PM

## 2022-04-24 ENCOUNTER — Other Ambulatory Visit: Payer: Self-pay | Admitting: Cardiology

## 2022-04-25 ENCOUNTER — Encounter: Payer: Self-pay | Admitting: Cardiology

## 2022-04-25 NOTE — Telephone Encounter (Signed)
Pt reports some afib today. He has taken Propranolol 10 mg twice today so far.  One at 11:30 am, again at 1:00  pm d/t continued afib. Upon discussion with pt he is only taking Propafenone once daily because he read the SE and this worried him some. He also reports stopping Diltiazem, although we did not tell him to do this. Educated pt to both medications and what they are used for. Aware that this may be the reason his AFib is acting up.  He is going to  start taking the Propafenone BID and restart Diltiazem. He will update [place] if this does not make an improvement and/or afib continues.

## 2022-05-02 NOTE — Telephone Encounter (Signed)
Dr. Curt Bears made aware earlier this week and agreeable to plan.

## 2022-05-20 ENCOUNTER — Encounter (HOSPITAL_COMMUNITY): Payer: Self-pay

## 2022-05-22 ENCOUNTER — Ambulatory Visit (HOSPITAL_COMMUNITY): Payer: 59 | Admitting: Nurse Practitioner

## 2022-05-24 ENCOUNTER — Ambulatory Visit (HOSPITAL_COMMUNITY)
Admission: RE | Admit: 2022-05-24 | Discharge: 2022-05-24 | Disposition: A | Discharge status: Home / Self Care | Payer: 59 | Source: Ambulatory Visit | Attending: Nurse Practitioner | Admitting: Nurse Practitioner

## 2022-05-24 ENCOUNTER — Telehealth: Payer: Self-pay

## 2022-05-24 VITALS — BP 156/86 | HR 58 | Ht 67.0 in | Wt 258.8 lb

## 2022-05-24 DIAGNOSIS — Z79899 Other long term (current) drug therapy: Secondary | ICD-10-CM | POA: Diagnosis not present

## 2022-05-24 DIAGNOSIS — Z6841 Body Mass Index (BMI) 40.0 and over, adult: Secondary | ICD-10-CM | POA: Diagnosis not present

## 2022-05-24 DIAGNOSIS — I480 Paroxysmal atrial fibrillation: Secondary | ICD-10-CM | POA: Diagnosis present

## 2022-05-24 DIAGNOSIS — F32A Depression, unspecified: Secondary | ICD-10-CM | POA: Diagnosis not present

## 2022-05-24 DIAGNOSIS — F419 Anxiety disorder, unspecified: Secondary | ICD-10-CM | POA: Insufficient documentation

## 2022-05-24 DIAGNOSIS — E669 Obesity, unspecified: Secondary | ICD-10-CM | POA: Diagnosis not present

## 2022-05-24 DIAGNOSIS — I10 Essential (primary) hypertension: Secondary | ICD-10-CM | POA: Diagnosis not present

## 2022-05-24 NOTE — Telephone Encounter (Signed)
Call to pt reference PREP referral  Explained program. Will need evening class.  Can do T/TH at [facility] 6p-715p Will call him with the next class starting in January to do intakes and measurements

## 2022-05-24 NOTE — Telephone Encounter (Signed)
Call to pt reference PREP referral  Will need an evening class offered T/Th 6p-715p at [facility] starting in January Will call him back closer to start of class for intake and measurements

## 2022-05-24 NOTE — Progress Notes (Signed)
Primary Care Physician: [name], MD Referring Physician: Dr. Curt Bears  Cardiologist: Dr. Florencia Reasons is a 61 y.o. male with a h/o paroxysmal afib, bradycardia, that was recently referred to Dr. Rayann Heman from Dr. [name], for monitor showing  predominate sinus rhythm, bursts of PAT as well as SVT/afib. He had a minimal  heart rate of 43 bpm, maximum 130 bpm, with an average of 64 bpm.   Pt is using flecainide and metoprolol for afib episodes which responds to the drugs pretty fast. Dr. Rayann Heman suggested to use diltiazem daily as pt  did not tolerate the metoprolol which  would make him feel very fatigued.   Pt took the diltiazem x 3 days but then had a afib episode which lasted for most of the day, so he stopped drug. He was not aware that he could still use the prn flecainide with it. He is in SR today at 56 bpm. He states that when his back pain flares he notices more heart rhythm irregularity.   Follow up in the AF clinic 05/24/22. Patient was seen by Dr Curt Bears 02/2022 and started on propafenone. Patient reports that when he takes the medication BID his afib is well controlled. If he ever misses a dose, he notices tachypalpitations. He does report a significant increase in his anxiety about his health recently.   Today, he denies symptoms of palpitations, chest pain, shortness of breath, orthopnea, PND, lower extremity edema, dizziness, presyncope, syncope, or neurologic sequela. The patient is tolerating medications without difficulties and is otherwise without complaint today.   Past Medical History:  Diagnosis Date   Arthritis    "lower back" (09/28/2015)   Atypical angina ([place]) dx'd 09/2015   GERD (gastroesophageal reflux disease)    High cholesterol    History of hiatal hernia    Hypertension    OSA on CPAP    PAF (paroxysmal atrial fibrillation) ([place]) 01/04/2021   Prediabetes    Seasonal allergies    Past Surgical History:  Procedure Laterality Date    ANKLE HARDWARE REMOVAL Left 2006   left    BIOPSY  03/09/2019   Procedure: BIOPSY;  Surgeon: Wilford Corner, MD;  [facility]: [facility];  Service: Endoscopy;;   CARDIAC CATHETERIZATION N/A 09/28/2015   Procedure: Left Heart Cath and Coronary Angiography;  Surgeon: Wellington Hampshire, MD;  Location: [facility];  Service: Cardiovascular;: For possible atypical angina, mildly elevated troponins.  Angiographically normal coronary arteries   CARDIAC EVENT MONITOR  09/2018   Heart rate range 47 bpm - 190 bpm.  Average heart rate 70 bpm.  Low burden of PACs and PVCs.  3 short runs of SVT/PAT with aberrancy versus V. tach.  25 bursts of PAT 3 episodes of what appear to be atrial tach versus atrial fib with a rate of 190 bpm    COLONOSCOPY WITH PROPOFOL N/A 03/09/2019   Procedure: COLONOSCOPY WITH PROPOFOL;  Surgeon: Wilford Corner, MD;  [facility];  Service: Endoscopy;  Laterality: N/A;   ESOPHAGOGASTRODUODENOSCOPY (EGD) WITH PROPOFOL N/A 03/09/2019   Procedure: ESOPHAGOGASTRODUODENOSCOPY (EGD) WITH PROPOFOL;  Surgeon: Wilford Corner, MD;  [facility];  Service: Endoscopy;  Laterality: N/A;   KNEE ARTHROSCOPY  09/20/2011   Procedure: ARTHROSCOPY KNEE;  Surgeon: Gearlean Alf, MD;  Location: [facility];  Service: Orthopedics;  Laterality: Right;  Medial meniscal DEBRIDEMENT with chondroplasty, right knee   KNEE ARTHROSCOPY Left 04/07/2013   Procedure: LEFT KNEE ARTHROSCOPY WITH MEDIAL MENISCUS  DEBRIDEMENT ;  Surgeon: Gearlean Alf, MD;  [facility]: [facility];  Service: Orthopedics;  Laterality: Left;   LIPOMA EXCISION Right 2004   neck   LYMPHADENECTOMY Bilateral 08/30/2021   Procedure: LYMPHADENECTOMY, PELVIC;  Surgeon: Raynelle Bring, MD;  [facility]: [facility];  Service: Urology;  Laterality: Bilateral;   NASAL SEPTUM SURGERY  1992   ORIF ANKLE FRACTURE Left 1981   ROBOT ASSISTED LAPAROSCOPIC RADICAL PROSTATECTOMY N/A 08/30/2021   Procedure: XI ROBOTIC  ASSISTED LAPAROSCOPIC RADICAL PROSTATECTOMY LEVEL 3;  Surgeon: Raynelle Bring, MD;  [facility]: [facility];  Service: Urology;  Laterality: N/A;   TONSILLECTOMY  2008   TRANSTHORACIC ECHOCARDIOGRAM  02/28/2021   EF 60 to 65%.  No R WMA.  Diastolic function, but unable to quantify.  Normal RV.  Normal valves.   UVULOPALATOPHARYNGOPLASTY  2008    Current Outpatient Medications  Medication Sig Dispense Refill   ASPIRIN 81 PO Take 81 mg by mouth daily.     atorvastatin (LIPITOR) 40 MG tablet Take 40 mg by mouth every other day.     diltiazem (CARDIZEM CD) 120 MG 24 hr capsule TAKE 1 CAPSULE(120 MG) BY MOUTH DAILY 90 capsule 3   gabapentin (NEURONTIN) 300 MG capsule Take 300-600 mg by mouth 2 (two) times daily as needed (pain).     LORazepam (ATIVAN) 1 MG tablet Take 1 mg by mouth daily as needed for anxiety.      magnesium gluconate (MAGONATE) 500 MG tablet Take 500 mg by mouth at bedtime.     Melatonin 10 MG TABS Take 1 tablet by mouth at bedtime.     propafenone (RYTHMOL SR) 325 MG 12 hr capsule Take 1 capsule (325 mg total) by mouth 2 (two) times daily. 60 capsule 11   propranolol (INDERAL) 20 MG tablet TAKE 1/2- 1 TABLET BY MOUTH AS NEEDED FOR PALPITATIONS 90 tablet 3   sildenafil (VIAGRA) 100 MG tablet Take 100 mg by mouth daily.     valsartan (DIOVAN) 320 MG tablet TAKE 1 TABLET(320 MG) BY MOUTH DAILY 30 tablet 3   No current facility-administered medications for this encounter.    Allergies  Allergen Reactions   Carvedilol Other (See Comments)    Makes patient feel like his moving in slow motion   Hydrocodone-Acetaminophen     Other reaction(s): feels like a hang over   Oxycodone Nausea Only   Trazodone Hcl     Other reaction(s): priapism    Social History   Socioeconomic History   Marital status: Married    Spouse name: Not on file   Number of children: Not on file   Years of education: Not on file   Highest education level: Not on file  Occupational History   Not on file   Tobacco Use   Smoking status: Never   Smokeless tobacco: Former    Types: Chew    Quit date: 09/16/2001   Tobacco comments:    Former [place] 03/21/2021  Vaping Use   Vaping Use: Never used  Substance and Sexual Activity   Alcohol use: Not Currently   Drug use: Not Currently    Types: Marijuana    Comment: "quit in the 1990s"   Sexual activity: Yes  Other Topics Concern   Not on file  Social History Narrative   Not on file   Social Determinants of Health   Financial Resource Strain: Not on file  Food Insecurity: Not on file  Transportation Needs: Not on file  Physical Activity: Not on file  Stress: Not on file  Social Connections: Not on file  Intimate Partner Violence: Not on file    Family History  Problem Relation Age of Onset   Hypertension Mother    Dementia Mother     ROS- All systems are reviewed and negative except as per the HPI above  Physical Exam: Vitals:   05/24/22 1002  BP: (!) 156/86  Pulse: (!) 58  Weight: 117.4 kg  Height: '5\' 7"'$  (1.702 m)   Wt Readings from Last 3 Encounters:  05/24/22 117.4 kg  02/19/22 118.9 kg  11/28/21 115.7 kg    Labs: Lab Results  Component Value Date   NA 143 09/12/2021   K 4.4 09/12/2021   CL 103 09/12/2021   CO2 27 09/12/2021   GLUCOSE 103 (H) 09/12/2021   BUN 9 09/12/2021   CREATININE 1.07 09/12/2021   CALCIUM 9.2 09/12/2021   PHOS 3.4 09/21/2015   MG 2.4 (H) 09/12/2021   Lab Results  Component Value Date   INR 1.03 09/22/2015   Lab Results  Component Value Date   CHOL 184 09/21/2015   HDL 38 (L) 09/21/2015   LDLCALC 125 (H) 09/21/2015   TRIG 106 09/21/2015     GEN- The patient is a well appearing obese male, alert and oriented x 3 today.   HEENT-head normocephalic, atraumatic, sclera clear, conjunctiva pink, hearing intact, trachea midline. Lungs- Clear to ausculation bilaterally, normal work of breathing Heart- Regular rate and rhythm, no murmurs, rubs or gallops  GI- soft, NT, ND, +  BS Extremities- no clubbing, cyanosis, or edema MS- no significant deformity or atrophy Skin- no rash or lesion Psych- euthymic mood, full affect Neuro- strength and sensation are intact   EKG- SB, LVH with repol changes Vent. rate 58 BPM PR interval 210 ms QRS duration 114 ms QT/QTcB 416/408 ms   Assessment and Plan:  1. Paroxysmal afib/atach  Patient maintaining SR with medication.  Continue propafenone 325 mg BID Continue diltiazem 120 mg daily Not currently on anticoagulation with low CV score of 1.   2. HTN Mildly elevated today, better controlled at home and at previous visits. No change today.   3. Anxiety Patient notes a significant rise in his anxiety about his health.  He is taking his lorazepam more often.  Patient agreeable to referral to Dr Michail Sermon.   4. Obesity Body mass index is 40.53 kg/m. Lifestyle modification was discussed and encouraged including regular physical activity and weight reduction. Will refer to [facility].   Follow up in the AF clinic in 6 months.    [facility] [address] [place], [place] [address] [phone]

## 2022-05-29 ENCOUNTER — Ambulatory Visit (INDEPENDENT_AMBULATORY_CARE_PROVIDER_SITE_OTHER): Payer: 59 | Admitting: Psychologist

## 2022-05-29 ENCOUNTER — Ambulatory Visit: Payer: 59 | Admitting: Psychologist

## 2022-05-29 DIAGNOSIS — F4521 Hypochondriasis: Secondary | ICD-10-CM

## 2022-05-29 NOTE — Progress Notes (Signed)
                Ritchard Paragas, PsyD 

## 2022-05-29 NOTE — Progress Notes (Signed)
[facility] Counselor Initial Adult Exam  Name: Joshua Stanton Date: 05/29/2022 MRN: [id number] DOB: 24-Mar-1961 PCP: [name], MD  Time spent: 03:05 pm to 03:50 pm; total time: 45 minutes  This session was held via in person. The patient consented to in-person therapy and was in the clinician's office. Limits of confidentiality were discussed with the patient.    Guardian/Payee:  NA    Paperwork requested: No   Reason for Visit /Presenting Problem: Anxiety secondary to Afib  Mental Status Exam: Appearance:   Well Groomed     Behavior:  Appropriate  Motor:  Normal  Speech/Language:   Clear and Coherent  Affect:  Appropriate  Mood:  normal  Thought process:  normal  Thought content:    WNL  Sensory/Perceptual disturbances:    WNL  Orientation:  oriented to person, place, and time/date  Attention:  Good  Concentration:  Good  Memory:  WNL  Fund of knowledge:   Good  Insight:    Fair  Judgment:   Good  Impulse Control:  Good     Reported Symptoms:  The patient endorsed experiencing the following: concern related to developing a serious health condition, anxiety about his health, and checking for signs of illness. He denied suicidal and homicidal ideation.   Risk Assessment: Danger to Self:  No Self-injurious Behavior: No Danger to Others: No Duty to Warn:no Physical Aggression / Violence:No  Access to Firearms a concern: No  Gang Involvement:No  Patient / guardian was educated about steps to take if suicide or homicide risk level increases between visits: n/a While future psychiatric events cannot be accurately predicted, the patient does not currently require acute inpatient psychiatric care and does not currently meet [facility] involuntary commitment criteria.  Substance Abuse History: Current substance abuse: No     Past Psychiatric History:   No previous psychological problems have been observed Outpatient Providers:NA History  of Psych Hospitalization: No  Psychological Testing:  NA    Abuse History:  Victim of: No.,  NA    Report needed: No. Victim of Neglect:No. Perpetrator of  NA   Witness / Exposure to Domestic Violence: No   Protective Services Involvement: No  Witness to [employer] Violence:  No   Family History:  Family History  Problem Relation Age of Onset   Hypertension Mother    Dementia Mother     Living situation: the patient lives with their spouse  Sexual Orientation: Straight  Relationship Status: married  Name of spouse / other:Joshua Stanton.  If a parent, number of children / ages:Patient has a 43 year old daughter  Support Systems: spouse  Financial Stress:  No   Income/Employment/Disability: Employment. Works for the Probation officer: No   Educational History: Education: Scientist, product/process development: Electrical engineer  Any cultural differences that may affect / interfere with treatment:  not applicable   Recreation/Hobbies: Fishing  Stressors: Health problems    Strengths: Supportive Relationships  Barriers:  NA   Legal History: Pending legal issue / charges: The patient has no significant history of legal issues. History of legal issue / charges:  NA  Medical History/Surgical History: reviewed Past Medical History:  Diagnosis Date   Arthritis    "lower back" (09/28/2015)   Atypical angina ([place]) dx'd 09/2015   GERD (gastroesophageal reflux disease)    High cholesterol    History of hiatal hernia    Hypertension    OSA on CPAP    PAF (paroxysmal atrial fibrillation) (  [place]) 01/04/2021   Prediabetes    Seasonal allergies     Past Surgical History:  Procedure Laterality Date   ANKLE HARDWARE REMOVAL Left 2006   left    BIOPSY  03/09/2019   Procedure: BIOPSY;  Surgeon: Wilford Corner, MD;  [facility];  Service: Endoscopy;;   CARDIAC CATHETERIZATION N/A 09/28/2015   Procedure: Left Heart Cath and  Coronary Angiography;  Surgeon: Wellington Hampshire, MD;  Location: [facility];  Service: Cardiovascular;: For possible atypical angina, mildly elevated troponins.  Angiographically normal coronary arteries   CARDIAC EVENT MONITOR  09/2018   Heart rate range 47 bpm - 190 bpm.  Average heart rate 70 bpm.  Low burden of PACs and PVCs.  3 short runs of SVT/PAT with aberrancy versus V. tach.  25 bursts of PAT 3 episodes of what appear to be atrial tach versus atrial fib with a rate of 190 bpm    COLONOSCOPY WITH PROPOFOL N/A 03/09/2019   Procedure: COLONOSCOPY WITH PROPOFOL;  Surgeon: Wilford Corner, MD;  [facility];  Service: Endoscopy;  Laterality: N/A;   ESOPHAGOGASTRODUODENOSCOPY (EGD) WITH PROPOFOL N/A 03/09/2019   Procedure: ESOPHAGOGASTRODUODENOSCOPY (EGD) WITH PROPOFOL;  Surgeon: Wilford Corner, MD;  [facility];  Service: Endoscopy;  Laterality: N/A;   KNEE ARTHROSCOPY  09/20/2011   Procedure: ARTHROSCOPY KNEE;  Surgeon: Gearlean Alf, MD;  Location: [facility];  Service: Orthopedics;  Laterality: Right;  Medial meniscal DEBRIDEMENT with chondroplasty, right knee   KNEE ARTHROSCOPY Left 04/07/2013   Procedure: LEFT KNEE ARTHROSCOPY WITH MEDIAL MENISCUS  DEBRIDEMENT ;  Surgeon: Gearlean Alf, MD;  [facility];  Service: Orthopedics;  Laterality: Left;   LIPOMA EXCISION Right 2004   neck   LYMPHADENECTOMY Bilateral 08/30/2021   Procedure: LYMPHADENECTOMY, PELVIC;  Surgeon: Raynelle Bring, MD;  [facility];  Service: Urology;  Laterality: Bilateral;   NASAL SEPTUM SURGERY  1992   ORIF ANKLE FRACTURE Left 1981   ROBOT ASSISTED LAPAROSCOPIC RADICAL PROSTATECTOMY N/A 08/30/2021   Procedure: XI ROBOTIC ASSISTED LAPAROSCOPIC RADICAL PROSTATECTOMY LEVEL 3;  Surgeon: Raynelle Bring, MD;  [facility];  Service: Urology;  Laterality: N/A;   TONSILLECTOMY  2008   TRANSTHORACIC ECHOCARDIOGRAM  02/28/2021   EF 60 to 65%.  No R WMA.   Diastolic function, but unable to quantify.  Normal RV.  Normal valves.   UVULOPALATOPHARYNGOPLASTY  2008    Medications: Current Outpatient Medications  Medication Sig Dispense Refill   ASPIRIN 81 PO Take 81 mg by mouth daily.     atorvastatin (LIPITOR) 40 MG tablet Take 40 mg by mouth every other day.     diltiazem (CARDIZEM CD) 120 MG 24 hr capsule TAKE 1 CAPSULE(120 MG) BY MOUTH DAILY 90 capsule 3   gabapentin (NEURONTIN) 300 MG capsule Take 300-600 mg by mouth 2 (two) times daily as needed (pain).     LORazepam (ATIVAN) 1 MG tablet Take 1 mg by mouth daily as needed for anxiety.      magnesium gluconate (MAGONATE) 500 MG tablet Take 500 mg by mouth at bedtime.     Melatonin 10 MG TABS Take 1 tablet by mouth at bedtime.     propafenone (RYTHMOL SR) 325 MG 12 hr capsule Take 1 capsule (325 mg total) by mouth 2 (two) times daily. 60 capsule 11   propranolol (INDERAL) 20 MG tablet TAKE 1/2- 1 TABLET BY MOUTH AS NEEDED FOR PALPITATIONS 90 tablet 3   sildenafil (VIAGRA) 100 MG tablet  Take 100 mg by mouth daily.     valsartan (DIOVAN) 320 MG tablet TAKE 1 TABLET(320 MG) BY MOUTH DAILY 30 tablet 3   No current facility-administered medications for this visit.    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 The patient also underwent surgery due to experience prostate cancer. Patient had surgery done in January of this year. The patient lives at home with his wife. 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 He denied suicidal and homicidal ideation.   The patient stated that he wants coping skills.  This psychologist believes the patient would benefit from participating in therapy bi-weekly to assist in meeting his needs.    Conception Chancy, PsyD

## 2022-05-29 NOTE — Plan of Care (Signed)

## 2022-06-24 ENCOUNTER — Ambulatory Visit: Payer: 59 | Admitting: Psychologist

## 2022-06-24 DIAGNOSIS — F4521 Hypochondriasis: Secondary | ICD-10-CM | POA: Diagnosis not present

## 2022-06-24 NOTE — Progress Notes (Signed)
                Marylon Verno, PsyD 

## 2022-06-24 NOTE — Progress Notes (Signed)
[facility] Counselor/Therapist Progress Note  Patient ID: Joshua Stanton, MRN: [id number],    Date: 06/24/2022  Time Spent: 02:02 pm to 02:48 pm; total time: 46 minutes   This session was held via in person. The patient consented to in-person therapy and was in the clinician's office. Limits of confidentiality were discussed with the patient.    Treatment Type: Individual Therapy  Reported Symptoms: Anxiety  Mental Status Exam: Appearance:  Well Groomed     Behavior: Appropriate  Motor: Normal  Speech/Language:  Clear and Coherent  Affect: Appropriate  Mood: normal  Thought process: normal  Thought content:   WNL  Sensory/Perceptual disturbances:   WNL  Orientation: oriented to person, place, and time/date  Attention: Good  Concentration: Good  Memory: WNL  Fund of knowledge:  Good  Insight:   Good  Judgment:  Good  Impulse Control: Good   Risk Assessment: Danger to Self:  No Self-injurious Behavior: No Danger to Others: No Duty to Warn:no Physical Aggression / Violence:No  Access to Firearms a concern: No  Gang Involvement:No   Subjective: Beginning the session, patient described himself as okay. After reviewing the treatment plan, patient asked for coping skills. After discussing coping skills, patient voiced that he was better. He was agreeable to homework and following up. He denied suicidal and homicidal ideation.    Interventions:  Worked on developing a therapeutic relationship with the patient using active listening and reflective statements. Provided emotional support using empathy and validation. Reviewed the treatment plan with the patient. Reviewed events since the intake. Identified goals for the session. Provided psychoeducation about mindfulness, guided imagery, defusion, and the worry box. Practiced and processed guided imagery, defusion, and mindfulness. Praised patient for experiencing less distress. Assisted in problem solving.  Assigned homework. Assessed for suicidal and homicidal ideation.   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  Plan:  Goals Work through the grieving process and face reality of own death Accept emotional support from others around them Live life to the fullest, event though time may be limited Become as knowledgeable about the medical condition  Reduce fear, anxiety about the health condition  Accept the illness Accept the role of psychological and behavioral factors  Stabilize anxiety level wile increasing ability to function Learn and implement coping skills that result in a reduction of anxiety  Alleviate depressive symptoms Recognize, accept, and cope with depressive feelings Develop healthy thinking patterns Develop healthy interpersonal relationships  Objectives target date for all objectives is 05/30/2023 Identify feelings associated with the illness Family members share with each other feelings Identify the losses or limitations that have been experienced Verbalize acceptance of the reality of the medical condition Commit to learning and implement a proactive approach to managing personal stresses Verbalize an understanding of the medical condition Work with therapist to develop a plan for coping with stress Learn and implement skills for managing stress Engage in social, productive activities that are possible Engage in faith based activities implement positive imagery Identify coping skills and sources of emotional support Patient's partner and family members verbalize their fears regarding severity of health condition Identify sources of emotional distress  Learning and implement calming skills to reduce overall anxiety Learn and implement problem solving strategies Identify and engage in pleasant activities Learning and implement personal and interpersonal skills to reduce  anxiety and improve interpersonal relationships Learn to accept limitations in life and commit to tolerating, rather than  avoiding, unpleasant emotions while accomplishing meaningful goals Identify major life conflicts from the past and present that form the basis for present anxiety Learn and implement behavioral strategies Verbalize an understanding and resolution of current interpersonal problems Learn and implement problem solving and decision making skills Learn and implement conflict resolution skills to resolve interpersonal problems Verbalize an understanding of healthy and unhealthy emotions verbalize insight into how past relationships may be influence current experiences with depression Use mindfulness and acceptance strategies and increase value based behavior  Increase hopeful statements about the future.   Interventions Teach about stress and ways to handle stress Assist the patient in developing a coping action plan for stressors Conduct skills based training for coping strategies Train problem focused skills Sort out what activities the individual can do Encourage patient to rely upon his/her spiritual faith Teach the patient to use guided imagery Probe and evaluate family's ability to provide emotional support Allow family to share their fears Assist the patient in identifying, sorting through, and verbalizing the various feelings generated by his/her medical condition Meet with family members  Ask patient list out limitations  Use stress inoculation training  Use Acceptance and Commitment Therapy to help client accept uncomfortable realities in order to accomplish value-consistent goals Reinforce the client's insight into the role of his/her past emotional pain and present anxiety  Discuss examples demonstrating that unrealistic worry overestimates the probability of threats and underestimate patient's ability  Assist the patient in analyzing his or her worries Help  patient understand that avoidance is reinforcing  Behavioral activation help the client explore the relationship, nature of the dispute,  Help the client develop new interpersonal skills and relationships Conduct Problem so living therapy Teach conflict resolution skills Use a process-experiential approach Conduct TLDP Conduct ACT  The patient and clinician reviewed the treatment plan on 06/24/2022. The patient approved of the treatment plan.    Conception Chancy, PsyD

## 2022-07-26 ENCOUNTER — Ambulatory Visit: Payer: 59 | Admitting: Psychologist

## 2022-07-26 ENCOUNTER — Encounter: Payer: Self-pay | Admitting: Cardiology

## 2022-07-26 DIAGNOSIS — F4521 Hypochondriasis: Secondary | ICD-10-CM | POA: Diagnosis not present

## 2022-07-26 NOTE — Progress Notes (Signed)
[facility] Counselor/Therapist Progress Note  Patient ID: Joshua Stanton, MRN: [id number],    Date: 07/26/2022  Time Spent: 09:07 am to 09:50 am; total time: 43 minutes   This session was held via in person. The patient consented to in-person therapy and was in the clinician's office. Limits of confidentiality were discussed with the patient.    Treatment Type: Individual Therapy  Reported Symptoms: Anxiety about work  Mental Status Exam: Appearance:  Well Groomed     Behavior: Appropriate  Motor: Normal  Speech/Language:  Clear and Coherent  Affect: Appropriate  Mood: normal  Thought process: normal  Thought content:   WNL  Sensory/Perceptual disturbances:   WNL  Orientation: oriented to person, place, and time/date  Attention: Good  Concentration: Good  Memory: WNL  Fund of knowledge:  Good  Insight:   Good  Judgment:  Good  Impulse Control: Good   Risk Assessment: Danger to Self:  No Self-injurious Behavior: No Danger to Others: No Duty to Warn:no Physical Aggression / Violence:No  Access to Firearms a concern: No  Gang Involvement:No   Subjective: Beginning the session, patient described himself as okay and voiced that there will be some changes at work contributing to some stress. Per the patient, he has moments of anxiety and that he feels as though coping skills have helped to an extent. Patient stated understanding of practicing when not feeling anxious. He also briefly talked about a difficult family situation related to the death of his mother in-law and how following that death there has been some family tension. He was agreeable to homework and following up. He denied suicidal and homicidal ideation.    Interventions:  Worked on developing a therapeutic relationship with the patient using active listening and reflective statements. Provided emotional support using empathy and validation. Reviewed the treatment plan with the patient.  Reviewed how the holidays went for the patient. Processed thoughts and emotions. Normalized and validated expressed thoughts and emotions. Identified goals for the session. Explored if the patient had been using strategies consistently. Provided psychoeducation about the sympathetic nervous systema and Pavlov dogs. Encouraged patient to practice when not feeling stress and normalized patient's response. Explored ways patient could practice more consistently. Provided empathic statements. Assigned homework. Assessed for suicidal and homicidal ideation.   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  Plan:  Goals Work through the grieving process and face reality of own death Accept emotional support from others around them Live life to the fullest, event though time may be limited Become as knowledgeable about the medical condition  Reduce fear, anxiety about the health condition  Accept the illness Accept the role of psychological and behavioral factors  Stabilize anxiety level wile increasing ability to function Learn and implement coping skills that result in a reduction of anxiety  Alleviate depressive symptoms Recognize, accept, and cope with depressive feelings Develop healthy thinking patterns Develop healthy interpersonal relationships  Objectives target date for all objectives is 05/30/2023 Identify feelings associated with the illness Family members share with each other feelings Identify the losses or limitations that have been experienced Verbalize acceptance of the reality of the medical condition Commit to learning and implement a proactive approach to managing personal stresses Verbalize an understanding of the medical condition Work with therapist to develop a plan for coping with stress Learn and implement skills for managing stress Engage in social, productive activities that are  possible Engage in  faith based activities implement positive imagery Identify coping skills and sources of emotional support Patient's partner and family members verbalize their fears regarding severity of health condition Identify sources of emotional distress  Learning and implement calming skills to reduce overall anxiety Learn and implement problem solving strategies Identify and engage in pleasant activities Learning and implement personal and interpersonal skills to reduce anxiety and improve interpersonal relationships Learn to accept limitations in life and commit to tolerating, rather than avoiding, unpleasant emotions while accomplishing meaningful goals Identify major life conflicts from the past and present that form the basis for present anxiety Learn and implement behavioral strategies Verbalize an understanding and resolution of current interpersonal problems Learn and implement problem solving and decision making skills Learn and implement conflict resolution skills to resolve interpersonal problems Verbalize an understanding of healthy and unhealthy emotions verbalize insight into how past relationships may be influence current experiences with depression Use mindfulness and acceptance strategies and increase value based behavior  Increase hopeful statements about the future.   Interventions Teach about stress and ways to handle stress Assist the patient in developing a coping action plan for stressors Conduct skills based training for coping strategies Train problem focused skills Sort out what activities the individual can do Encourage patient to rely upon his/her spiritual faith Teach the patient to use guided imagery Probe and evaluate family's ability to provide emotional support Allow family to share their fears Assist the patient in identifying, sorting through, and verbalizing the various feelings generated by his/her medical condition Meet with family members   Ask patient list out limitations  Use stress inoculation training  Use Acceptance and Commitment Therapy to help client accept uncomfortable realities in order to accomplish value-consistent goals Reinforce the client's insight into the role of his/her past emotional pain and present anxiety  Discuss examples demonstrating that unrealistic worry overestimates the probability of threats and underestimate patient's ability  Assist the patient in analyzing his or her worries Help patient understand that avoidance is reinforcing  Behavioral activation help the client explore the relationship, nature of the dispute,  Help the client develop new interpersonal skills and relationships Conduct Problem so living therapy Teach conflict resolution skills Use a process-experiential approach Conduct TLDP Conduct ACT  The patient and clinician reviewed the treatment plan on 06/24/2022. The patient approved of the treatment plan.    Conception Chancy, PsyD

## 2022-07-29 NOTE — Telephone Encounter (Signed)
Left message to call back

## 2022-08-07 ENCOUNTER — Encounter: Payer: Self-pay | Admitting: Cardiology

## 2022-08-09 NOTE — Telephone Encounter (Signed)
Pt reports feeling better than the other day when he sent this message. He will update [place] next week on how he is feeling. He appreciates my call

## 2022-08-22 ENCOUNTER — Encounter (HOSPITAL_COMMUNITY): Payer: Self-pay | Admitting: *Deleted

## 2022-09-16 ENCOUNTER — Ambulatory Visit: Payer: 59 | Admitting: Psychologist

## 2022-09-16 DIAGNOSIS — F4521 Hypochondriasis: Secondary | ICD-10-CM | POA: Diagnosis not present

## 2022-09-16 NOTE — Progress Notes (Signed)
[facility] Counselor/Therapist Progress Note  Patient ID: Joshua Stanton, MRN: [id number],    Date: 09/16/2022  Time Spent: 03:15 pm to 03:36 pm; total time: 21 minutes   This session was held via in person. The patient consented to in-person therapy and was in the clinician's office. Limits of confidentiality were discussed with the patient.    Treatment Type: Individual Therapy  Reported Symptoms: Denied symptoms  Mental Status Exam: Appearance:  Well Groomed     Behavior: Appropriate  Motor: Normal  Speech/Language:  Clear and Coherent  Affect: Appropriate  Mood: normal  Thought process: normal  Thought content:   WNL  Sensory/Perceptual disturbances:   WNL  Orientation: oriented to person, place, and time/date  Attention: Good  Concentration: Good  Memory: WNL  Fund of knowledge:  Good  Insight:   Good  Judgment:  Good  Impulse Control: Good   Risk Assessment: Danger to Self:  No Self-injurious Behavior: No Danger to Others: No Duty to Warn:no Physical Aggression / Violence:No  Access to Firearms a concern: No  Gang Involvement:No   Subjective: Beginning the session, patient described himself as doing well and denied any concerns. He reflected on what he learned about himself in therapy. He briefly processed potential barriers and discussed how to overcome them. He reflected on the therapeutic relationship and how it assisted him. Briefly discussed concerns regarding erectile dysfunction. He terminated therapy services due to accomplishing goals. H e stated understanding regarding the transition. He denied suicidal and homicidal ideation.    Interventions:  Worked on developing a therapeutic relationship with the patient using active listening and reflective statements. Provided emotional support using empathy and validation. Reviewed the treatment plan with the patient. Praised the patient for doing well. Explored events since the last session.  Processed how patient was feeling regarding the current work situation. Reflected on whether or not patient was experiencing symptoms. Processed what patient learned about self in the process of therapy. Explored how the therapeutic relationship assisted him. Identified barriers and discussed how to overcome them. Provided education about erectile dysfunction and encouraged patient to start with medical team and then if necessary use psychological services to assist. Informed patient of leaving the system. Discussed options for further therapy if needed in the future. Assessed for suicidal and homicidal ideation.   Homework: NA. Patient terminated therapy  Next Session: NA. Patient terminated therapy due to reaching goals.   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  Plan:  Goals Work through the grieving process and face reality of own death Accept emotional support from others around them Live life to the fullest, event though time may be limited Become as knowledgeable about the medical condition  Reduce fear, anxiety about the health condition  Accept the illness Accept the role of psychological and behavioral factors  Stabilize anxiety level wile increasing ability to function Learn and implement coping skills that result in a reduction of anxiety  Alleviate depressive symptoms Recognize, accept, and cope with depressive feelings Develop healthy thinking patterns Develop healthy interpersonal relationships  Objectives target date for all objectives is 05/30/2023 Identify feelings associated with the illness Family members share with each other feelings Identify the losses or limitations that have been experienced Verbalize acceptance of the reality of the medical condition Commit to learning and implement a proactive approach to managing personal stresses Verbalize an understanding of the medical condition Work with therapist to develop a plan for coping with stress Learn and  implement skills for managing stress  Engage in social, productive activities that are possible Engage in faith based activities implement positive imagery Identify coping skills and sources of emotional support Patient's partner and family members verbalize their fears regarding severity of health condition Identify sources of emotional distress  Learning and implement calming skills to reduce overall anxiety Learn and implement problem solving strategies Identify and engage in pleasant activities Learning and implement personal and interpersonal skills to reduce anxiety and improve interpersonal relationships Learn to accept limitations in life and commit to tolerating, rather than avoiding, unpleasant emotions while accomplishing meaningful goals Identify major life conflicts from the past and present that form the basis for present anxiety Learn and implement behavioral strategies Verbalize an understanding and resolution of current interpersonal problems Learn and implement problem solving and decision making skills Learn and implement conflict resolution skills to resolve interpersonal problems Verbalize an understanding of healthy and unhealthy emotions verbalize insight into how past relationships may be influence current experiences with depression Use mindfulness and acceptance strategies and increase value based behavior  Increase hopeful statements about the future.   Interventions Teach about stress and ways to handle stress Assist the patient in developing a coping action plan for stressors Conduct skills based training for coping strategies Train problem focused skills Sort out what activities the individual can do Encourage patient to rely upon his/her spiritual faith Teach the patient to use guided imagery Probe and evaluate family's ability to provide emotional support Allow family to share their fears Assist the patient in identifying, sorting through, and verbalizing  the various feelings generated by his/her medical condition Meet with family members  Ask patient list out limitations  Use stress inoculation training  Use Acceptance and Commitment Therapy to help client accept uncomfortable realities in order to accomplish value-consistent goals Reinforce the client's insight into the role of his/her past emotional pain and present anxiety  Discuss examples demonstrating that unrealistic worry overestimates the probability of threats and underestimate patient's ability  Assist the patient in analyzing his or her worries Help patient understand that avoidance is reinforcing  Behavioral activation help the client explore the relationship, nature of the dispute,  Help the client develop new interpersonal skills and relationships Conduct Problem so living therapy Teach conflict resolution skills Use a process-experiential approach Conduct TLDP Conduct ACT  The patient and clinician reviewed the treatment plan on 06/24/2022. The patient approved of the treatment plan.    Conception Chancy, PsyD

## 2022-09-19 ENCOUNTER — Other Ambulatory Visit: Payer: Self-pay | Admitting: Cardiology

## 2022-09-19 ENCOUNTER — Encounter: Payer: Self-pay | Admitting: Cardiology

## 2022-10-07 ENCOUNTER — Encounter: Payer: Self-pay | Admitting: Cardiology

## 2022-10-07 ENCOUNTER — Ambulatory Visit: Payer: No Typology Code available for payment source | Attending: Cardiology | Admitting: Cardiology

## 2022-10-07 VITALS — BP 150/100 | HR 118 | Ht 67.0 in | Wt 269.0 lb

## 2022-10-07 DIAGNOSIS — I480 Paroxysmal atrial fibrillation: Secondary | ICD-10-CM | POA: Diagnosis not present

## 2022-10-07 DIAGNOSIS — I483 Typical atrial flutter: Secondary | ICD-10-CM | POA: Diagnosis not present

## 2022-10-07 DIAGNOSIS — Z79899 Other long term (current) drug therapy: Secondary | ICD-10-CM | POA: Diagnosis not present

## 2022-10-07 DIAGNOSIS — I10 Essential (primary) hypertension: Secondary | ICD-10-CM | POA: Diagnosis not present

## 2022-10-07 NOTE — Progress Notes (Signed)
Electrophysiology Office Note   Date:  10/07/2022   ID:  Joshua Stanton, DOB 29-Jun-1961, MRN [id number]  PCP:  [name], MD  Cardiologist:  [name] Primary Electrophysiologist:  Zekiah Coen [name], MD    Chief Complaint: AF   History of Present Illness: Joshua Stanton is a 62 y.o. male who is being seen today for the evaluation of AF at the request of [name], MD. Presenting today for electrophysiology evaluation.  He has a history significant hypertension, obstructive sleep apnea on CPAP, atrial fibrillation, and pre-diabetes.  He has a Kardia mobile that showed episodes of atrial fibrillation and atrial tachycardia.  He is on propafenone BID and was last seen in the Afib clinic in November 2023. He is not anticoagulated due to a CHA2DS2-VASc of 1.   On follow up today, he has had financial difficulty affording the propafenone prescription. Due to this, he has not been taking it BID and over the past 3 weeks either taking it once qhs or not at all. He admits to having intermittent episodes of palpitations. Today, he appears to be going into atrial flutter intermittently. He has now been able to get his medications through the [place] and has new prescriptions of propafenone and diltiazem at home. He has not taken his morning dose of propafenone due to normally taking it at 1100 and 2300. His main symptom is palpitations and being able to feel it in his "back."  Today, denies symptoms of chest pain, shortness of breath, orthopnea, PND, lower extremity edema, claudication, dizziness, presyncope, syncope, bleeding, or neurologic sequela. The patient is tolerating medications without difficulties.     Past Medical History:  Diagnosis Date   Arthritis    "lower back" (09/28/2015)   Atypical angina dx'd 09/2015   GERD (gastroesophageal reflux disease)    High cholesterol    History of hiatal hernia    Hypertension    OSA on CPAP    PAF (paroxysmal atrial  fibrillation) ([place]) 01/04/2021   Prediabetes    Seasonal allergies    Past Surgical History:  Procedure Laterality Date   ANKLE HARDWARE REMOVAL Left 2006   left    BIOPSY  03/09/2019   Procedure: BIOPSY;  Surgeon: Wilford Corner, MD;  Location: [facility];  Service: Endoscopy;;   CARDIAC CATHETERIZATION N/A 09/28/2015   Procedure: Left Heart Cath and Coronary Angiography;  Surgeon: Wellington Hampshire, MD;  Location: [facility];  Service: Cardiovascular;: For possible atypical angina, mildly elevated troponins.  Angiographically normal coronary arteries   CARDIAC EVENT MONITOR  09/2018   Heart rate range 47 bpm - 190 bpm.  Average heart rate 70 bpm.  Low burden of PACs and PVCs.  3 short runs of SVT/PAT with aberrancy versus V. tach.  25 bursts of PAT 3 episodes of what appear to be atrial tach versus atrial fib with a rate of 190 bpm    COLONOSCOPY WITH PROPOFOL N/A 03/09/2019   Procedure: COLONOSCOPY WITH PROPOFOL;  Surgeon: Wilford Corner, MD;  [facility]: [facility];  Service: Endoscopy;  Laterality: N/A;   ESOPHAGOGASTRODUODENOSCOPY (EGD) WITH PROPOFOL N/A 03/09/2019   Procedure: ESOPHAGOGASTRODUODENOSCOPY (EGD) WITH PROPOFOL;  Surgeon: Wilford Corner, MD;  Location: [facility];  Service: Endoscopy;  Laterality: N/A;   KNEE ARTHROSCOPY  09/20/2011   Procedure: ARTHROSCOPY KNEE;  Surgeon: Gearlean Alf, MD;  Location: [facility];  Service: Orthopedics;  Laterality: Right;  Medial meniscal DEBRIDEMENT with chondroplasty, right knee   KNEE ARTHROSCOPY Left 04/07/2013  Procedure: LEFT KNEE ARTHROSCOPY WITH MEDIAL MENISCUS  DEBRIDEMENT ;  Surgeon: Gearlean Alf, MD;  Location: [facility];  Service: Orthopedics;  Laterality: Left;   LIPOMA EXCISION Right 2004   neck   LYMPHADENECTOMY Bilateral 08/30/2021   Procedure: LYMPHADENECTOMY, PELVIC;  Surgeon: Raynelle Bring, MD;  Location: [facility];  Service: Urology;  Laterality: Bilateral;   NASAL SEPTUM SURGERY   1992   ORIF ANKLE FRACTURE Left 1981   ROBOT ASSISTED LAPAROSCOPIC RADICAL PROSTATECTOMY N/A 08/30/2021   Procedure: XI ROBOTIC ASSISTED LAPAROSCOPIC RADICAL PROSTATECTOMY LEVEL 3;  Surgeon: Raynelle Bring, MD;  Location: [facility];  Service: Urology;  Laterality: N/A;   TONSILLECTOMY  2008   TRANSTHORACIC ECHOCARDIOGRAM  02/28/2021   EF 60 to 65%.  No R WMA.  Diastolic function, but unable to quantify.  Normal RV.  Normal valves.   UVULOPALATOPHARYNGOPLASTY  2008     Current Outpatient Medications  Medication Sig Dispense Refill   ASPIRIN 81 PO Take 81 mg by mouth daily.     atorvastatin (LIPITOR) 40 MG tablet Take 40 mg by mouth every other day.     diltiazem (CARDIZEM CD) 120 MG 24 hr capsule TAKE 1 CAPSULE(120 MG) BY MOUTH DAILY 90 capsule 3   gabapentin (NEURONTIN) 300 MG capsule Take 300-600 mg by mouth 2 (two) times daily as needed (pain).     LORazepam (ATIVAN) 1 MG tablet Take 1 mg by mouth daily as needed for anxiety.      magnesium gluconate (MAGONATE) 500 MG tablet Take 500 mg by mouth at bedtime.     Melatonin 10 MG TABS Take 1 tablet by mouth at bedtime.     propafenone (RYTHMOL SR) 325 MG 12 hr capsule Take 1 capsule (325 mg total) by mouth 2 (two) times daily. 60 capsule 11   propranolol (INDERAL) 20 MG tablet TAKE 1/2- 1 TABLET BY MOUTH AS NEEDED FOR PALPITATIONS 90 tablet 3   sildenafil (VIAGRA) 100 MG tablet Take 100 mg by mouth daily.     valsartan (DIOVAN) 320 MG tablet TAKE 1 TABLET(320 MG) BY MOUTH DAILY 30 tablet 3   No current facility-administered medications for this visit.    Allergies:   Morphine, Carvedilol, Hydrocodone-acetaminophen, Oxycodone, and Trazodone hcl   Social History:  The patient  reports that he has never smoked. He quit smokeless tobacco use about 21 years ago.  His smokeless tobacco use included chew. He reports that he does not currently use alcohol. He reports that he does not currently use drugs after having used the following drugs:  Marijuana.   Family History:  The patient's family history includes Dementia in his mother; Hypertension in his mother.   ROS:  Please see the history of present illness.   Otherwise, review of systems is positive for none.   All other systems are reviewed and negative.   PHYSICAL EXAM: VS:  BP (!) 150/100   Pulse (!) 118   Ht 5\' 7"  (1.702 m)   Wt 269 lb (122 kg)   SpO2 95%   BMI 42.13 kg/m  , BMI Body mass index is 42.13 kg/m. GEN: Well nourished, well developed, in no acute distress  HEENT: normal  Neck: no JVD, carotid bruits, or masses Cardiac: Tachycardic rate; no murmurs, rubs, or gallops,no edema  Respiratory:  clear to auscultation bilaterally, normal work of breathing GI: soft, nontender, nondistended, + BS MS: no deformity or atrophy  Skin: warm and dry Neuro:  Strength and sensation are intact Psych: euthymic mood, full  affect  EKG:  EKG is ordered today. Personal review of the ekg ordered shows atrial flutter HR 118 and sinus rhythm with 1st degree AV block HR 63.  LVH T wave inversion V6  Recent Labs: No results found for requested labs within last 365 days.    Lipid Panel     Component Value Date/Time   CHOL 184 09/21/2015 0915   TRIG 106 09/21/2015 0915   HDL 38 (L) 09/21/2015 0915   CHOLHDL 4.8 09/21/2015 0915   VLDL 21 09/21/2015 0915   LDLCALC 125 (H) 09/21/2015 0915     Wt Readings from Last 3 Encounters:  10/07/22 269 lb (122 kg)  05/24/22 258 lb 12.8 oz (117.4 kg)  02/19/22 262 lb 3.2 oz (118.9 kg)      Other studies Reviewed: Additional studies/ records that were reviewed today include: TTE 02/28/21  Review of the above records today demonstrates:   1. Left ventricular ejection fraction, by estimation, is 60 to 65%. The  left ventricle has normal function. The left ventricle has no regional  wall motion abnormalities. The left ventricular internal cavity size was  mildly dilated. Left ventricular  diastolic parameters are  indeterminate. Elevated left ventricular  end-diastolic pressure.   2. Right ventricular systolic function is normal. The right ventricular  size is normal. Tricuspid regurgitation signal is inadequate for assessing  PA pressure.   3. The mitral valve is normal in structure. Mild mitral valve  regurgitation. No evidence of mitral stenosis.   4. The aortic valve is normal in structure. Aortic valve regurgitation is  not visualized. No aortic stenosis is present.   5. Aortic dilatation noted. There is mild dilatation of the ascending  aorta, measuring 40 mm.   6. The inferior vena cava is normal in size with greater than 50%    ASSESSMENT AND PLAN:  1.  Paroxysmal atrial fibrillation/atrial tachycardia/atrial flutter: Currently on diltiazem, Rythmol, propanolol prn.  CHA2DS2-VASc of 1 and thus not anticoagulated.  He is going between SR and atrial flutter in office.   He is now able to obtain his medications for free and previous non compliance was due to financial difficulty in affording propafenone prescription.   He Vitoria Conyer be compliant with his medication and we Keena Dinse continue propafenone BID at current dose as well as diltiazem. Instructed to call us in 1 week if still having frequent episodes despite resuming medications.  Discussion at length about ablation procedure and its benefits vs risks. He Orphia Mctigue think about it and either call to schedule ablation or revisit topic at next follow up.  2.  Hypertension:  Elevated today, Ketra Duchesne recheck at home.  3.  Obstructive sleep apnea: CPAP compliance encouraged  4.  High risk medication monitoring: Currently on Rythmol for atrial fibrillation.  QRS remains narrow. PR interval stable.   Current medicines are reviewed at length with the patient today.   The patient does not have concerns regarding his medicines.  The following changes were made today: None, resuming medication regimen.  Labs/ tests ordered today include:  Orders Placed  This Encounter  Procedures   EKG 12-Lead     Disposition:   FU 6 months with Afib clinic  Signed, Taysia Rivere [name], MD  10/07/2022 9:54 AM     [facility] [address] [place] [address] [phone] (office) [phone] (fax)  I have seen and examined this patient with Emily Filbert.  Agree with above, note added to reflect my findings.  He is  continue to have episodic palpitations.  He overall feels well, though he has been only taking his propafenone on a daily basis.  He has noted increasing palpitations over that time.  He states that he was initially having trouble affording the medication, but is now getting medication through the [place].  GEN: Well nourished, well developed, in no acute distress  HEENT: normal  Neck: no JVD, carotid bruits, or masses Cardiac: RRR; no murmurs, rubs, or gallops,no edema  Respiratory:  clear to auscultation bilaterally, normal work of breathing GI: soft, nontender, nondistended, + BS MS: no deformity or atrophy  Skin: warm and dry Neuro:  Strength and sensation are intact Psych: euthymic mood, full affect   Paroxysmal atrial fibrillation/flutter/atrial tachycardia: Currently on propafenone.  CHA2DS2-VASc of 1 and thus not anticoagulated.  He is continuing to have episodes of tachypalpitations.  He had been taking his propafenone on a daily basis.  He Yaxiel Minnie take it twice daily now that he is getting the medication from the [place].  We did discuss ablation.  He Elodia Haviland think about this further and Jameson Tormey let us know. Hypertension: Elevated today.  Usually well-controlled.  No changes. High risk medication monitoring: Currently on propafenone for atrial fibrillation.  QRS remains narrow.  Dekker Verga M. Kendallyn Lippold MD 10/07/2022 9:54 AM

## 2022-10-07 NOTE — Patient Instructions (Signed)
Medication Instructions:  Your physician recommends that you continue on your current medications as directed. Please refer to the Current Medication list given to you today.  *If you need a refill on your cardiac medications before your next appointment, please call your pharmacy*   Lab Work: None ordered   Testing/Procedures: None ordered   Follow-Up: At [facility], you and your health needs are our priority.  As part of our continuing mission to provide you with exceptional heart care, we have created designated Provider Care Teams.  These Care Teams include your primary Cardiologist (physician) and Advanced Practice Providers (APPs -  Physician Assistants and Nurse Practitioners) who all work together to provide you with the care you need, when you need it.  Your next appointment:   6 month(s)  The format for your next appointment:   In Person  Provider:   You will follow up in the [facility] located at [facility]. Your provider will be: Clint R. Fenton, PA-C or Luther Bradley, [place]    Thank you for choosing [facility]!!   Trinidad Curet, RN [phone]  Other Instructions  Cardiac Ablation Cardiac ablation is a procedure to destroy (ablate) heart tissue that is sending bad signals. These bad signals cause the heart to beat very fast or in a way that is not normal. Destroying some tissues can help make the heart rhythm normal. Tell your doctor about: Any allergies you have. All medicines you are taking. These include vitamins, herbs, eye drops, creams, and over-the-counter medicines. Any problems you or family members have had with anesthesia. Any bleeding problems you have. Any surgeries you have had. Any medical conditions you have. Whether you are pregnant or may be pregnant. What are the risks? Your doctor will talk with you about risks. These may include: Infection. Bruising and bleeding. Stroke or blood clots. Damage to nearby  areas of your body. Allergies to medicines or dyes. Needing a pacemaker if the heart gets damaged. A pacemaker helps the heart beat normally. The procedure not working. What happens before the procedure? Medicines Ask your doctor about changing or stopping: Your normal medicines. Vitamins, herbs, and supplements. Over-the-counter medicines. Do not take aspirin or ibuprofen unless you are told to. General instructions Follow instructions from your doctor about what you may eat and drink. If you will be going home right after the procedure, plan to have a responsible adult: Take you home from the hospital or clinic. You will not be allowed to drive. Care for you for the time you are told. Ask your doctor what steps will be taken to prevent the spread of germs. What happens during the procedure?  An IV tube will be put into one of your veins. You may be given: A sedative. This helps you relax. Anesthesia. This will: Numb certain areas of your body. The skin on your neck or groin will be numbed. A cut (incision) will be made in your neck or groin. A needle will be put through the cut and into a large vein. The small, thin tube (catheter) will be put into the needle. The tube will be moved to your heart. A type of X-ray (fluoroscopy) will be used to help guide the tube. It will also show constant images of the heart on a screen. Dye may be put through the tube. This helps your doctor see your heart. An electric current will be sent from the tube to destroy heart tissue in certain areas. The tube will  be taken out. Pressure will be held on your cut. This helps stop bleeding. A bandage (dressing) will be put over your cut. The procedure may vary among doctors and hospitals. What happens after the procedure? You will be monitored until you leave the hospital or clinic. This includes checking your blood pressure, heart rate and rhythm, breathing rate, and blood oxygen level. Your cut will  be checked for bleeding. You will need to lie still for a few hours. If your groin was used, you will need to keep your leg straight for a few hours after the small, thin tube is removed. This information is not intended to replace advice given to you by your health care provider. Make sure you discuss any questions you have with your health care provider. Document Revised: 12/18/2021 Document Reviewed: 12/18/2021 Elsevier Patient Education  [place].

## 2023-01-29 ENCOUNTER — Other Ambulatory Visit: Payer: Self-pay | Admitting: Cardiology

## 2023-04-09 ENCOUNTER — Ambulatory Visit (HOSPITAL_COMMUNITY): Payer: No Typology Code available for payment source | Admitting: Physician Assistant

## 2023-04-15 ENCOUNTER — Ambulatory Visit (HOSPITAL_COMMUNITY)
Admission: RE | Admit: 2023-04-15 | Discharge: 2023-04-15 | Disposition: A | Discharge status: Home / Self Care | Payer: No Typology Code available for payment source | Source: Ambulatory Visit | Attending: Physician Assistant | Admitting: Physician Assistant

## 2023-04-15 ENCOUNTER — Encounter (HOSPITAL_COMMUNITY): Payer: Self-pay | Admitting: Physician Assistant

## 2023-04-15 VITALS — BP 132/82 | HR 68 | Ht 67.0 in | Wt 269.0 lb

## 2023-04-15 DIAGNOSIS — I480 Paroxysmal atrial fibrillation: Secondary | ICD-10-CM | POA: Diagnosis present

## 2023-04-15 DIAGNOSIS — R9431 Abnormal electrocardiogram [ECG] [EKG]: Secondary | ICD-10-CM | POA: Diagnosis not present

## 2023-04-15 DIAGNOSIS — E669 Obesity, unspecified: Secondary | ICD-10-CM | POA: Diagnosis not present

## 2023-04-15 DIAGNOSIS — R000 Tachycardia, unspecified: Secondary | ICD-10-CM | POA: Insufficient documentation

## 2023-04-15 DIAGNOSIS — I10 Essential (primary) hypertension: Secondary | ICD-10-CM | POA: Diagnosis not present

## 2023-04-15 DIAGNOSIS — G4733 Obstructive sleep apnea (adult) (pediatric): Secondary | ICD-10-CM | POA: Diagnosis not present

## 2023-04-15 DIAGNOSIS — Z6841 Body Mass Index (BMI) 40.0 and over, adult: Secondary | ICD-10-CM | POA: Insufficient documentation

## 2023-04-15 NOTE — Progress Notes (Signed)
Primary Care Physician: Noberto Retort, MD Referring Physician: Dr. Elberta Fortis  Cardiologist: Dr. Edmonia James is a 62 y.o. male with a h/o paroxysmal afib, bradycardia, that was recently referred to Dr. Johney Frame from Dr. Herbie Baltimore, for monitor showing  predominate sinus rhythm, bursts of PAT as well as SVT/afib. He had a minimal  heart rate of 43 bpm, maximum 130 bpm, with an average of 64 bpm.   Pt is using flecainide and metoprolol for afib episodes which responds to the drugs pretty fast. Dr. Johney Frame suggested to use diltiazem daily as pt  did not tolerate the metoprolol which  would make him feel very fatigued.   Pt took the diltiazem x 3 days but then had a afib episode which lasted for most of the day, so he stopped drug. He was not aware that he could still use the prn flecainide with it. He is in SR today at 56 bpm. He states that when his back pain flares he notices more heart rhythm irregularity.   Follow up in the AF clinic 05/24/22. Patient was seen by Dr Elberta Fortis 02/2022 and started on propafenone. Patient reports that when he takes the medication BID his afib is well controlled. If he ever misses a dose, he notices tachypalpitations. He does report a significant increase in his anxiety about his health recently.   Follow up in the AF clinic 04/15/23. Patient reports that he will have an afib episode 1-3 times per week. Kardia strips personally reviewed today. He admits that he has only been taking his propafenone once daily. No bleeding issues on anticoagulation.   Today, he denies symptoms of chest pain, shortness of breath, orthopnea, PND, lower extremity edema, dizziness, presyncope, syncope, or neurologic sequela. The patient is tolerating medications without difficulties and is otherwise without complaint today.   Past Medical History:  Diagnosis Date   Arthritis    "lower back" (09/28/2015)   Atypical angina ([facility]) dx'd 09/2015   GERD (gastroesophageal reflux  disease)    High cholesterol    History of hiatal hernia    Hypertension    OSA on CPAP    PAF (paroxysmal atrial fibrillation) ([facility]) 01/04/2021   Prediabetes    Seasonal allergies     Current Outpatient Medications  Medication Sig Dispense Refill   ASPIRIN 81 PO Take 81 mg by mouth daily.     atorvastatin (LIPITOR) 40 MG tablet Take 40 mg by mouth every other day.     diltiazem (CARDIZEM CD) 120 MG 24 hr capsule TAKE 1 CAPSULE(120 MG) BY MOUTH DAILY 90 capsule 3   gabapentin (NEURONTIN) 300 MG capsule Take 300-600 mg by mouth 2 (two) times daily as needed (pain).     LORazepam (ATIVAN) 1 MG tablet Take 1 mg by mouth daily as needed for anxiety.      magnesium gluconate (MAGONATE) 500 MG tablet Take 500 mg by mouth at bedtime.     Melatonin 10 MG TABS Take 1 tablet by mouth at bedtime.     propafenone (RYTHMOL SR) 325 MG 12 hr capsule Take 1 capsule (325 mg total) by mouth 2 (two) times daily. 60 capsule 11   propranolol (INDERAL) 20 MG tablet TAKE 1/2- 1 TABLET BY MOUTH AS NEEDED FOR PALPITATIONS 90 tablet 3   sildenafil (VIAGRA) 100 MG tablet Take 100 mg by mouth daily.     valsartan (DIOVAN) 320 MG tablet TAKE 1 TABLET(320 MG) BY MOUTH DAILY 90 tablet 3   No  current facility-administered medications for this encounter.    ROS- All systems are reviewed and negative except as per the HPI above  Physical Exam: Vitals:   04/15/23 0945  BP: 132/82  Pulse: 68  Weight: 122 kg  Height: 5\' 7"  (1.702 m)    Wt Readings from Last 3 Encounters:  04/15/23 122 kg  10/07/22 122 kg  05/24/22 117.4 kg     GEN: Well nourished, well developed in no acute distress NECK: No JVD; No carotid bruits CARDIAC: Regular rate and rhythm, no murmurs, rubs, gallops RESPIRATORY:  Clear to auscultation without rales, wheezing or rhonchi  ABDOMEN: Soft, non-tender, non-distended EXTREMITIES:  No edema; No deformity     EKG today demonstrates SR, NST Vent. rate 68 BPM PR interval 198 ms QRS  duration 116 ms QT/QTcB 414/440 ms   CHA2DS2-VASc Score = 1  The patient's score is based upon: CHF History: 0 HTN History: 1 Diabetes History: 0 Stroke History: 0 Vascular Disease History: 0 Age Score: 0 Gender Score: 0       ASSESSMENT AND PLAN: Paroxysmal Atrial Fibrillation/atrial tachycardia The patient's CHA2DS2-VASc score is 1, indicating a 0.6% annual risk of stroke.   Patient in SR today but having frequent afib.  We discussed the importance of taking his propafenone twice daily. Cost is not a concern. If he continues to have frequent afib on the correct dose of AAD, may consider ablation or changing AAD.  Continue propafenone 325 mg BID Continue diltiazem 120 mg daily Not currently on anticoagulation with low CV score   HTN Stable on current regimen  Obesity Body mass index is 42.13 kg/m.  Encouraged lifestyle modification  OSA  Encouraged nightly CPAP   Follow up in the AF clinic in 6 months.    Jorja Loa PA-C Afib Clinic [facility] [address] [place], [place] [address] [phone]

## 2023-05-22 ENCOUNTER — Encounter: Payer: Self-pay | Admitting: Cardiology

## 2023-08-18 ENCOUNTER — Encounter (HOSPITAL_COMMUNITY): Payer: Self-pay

## 2023-09-24 ENCOUNTER — Telehealth: Payer: Self-pay | Admitting: Cardiology

## 2023-09-24 NOTE — Telephone Encounter (Signed)
 Please see forwarded pt schedule message for patient.   He reached out via pt schedule asking for an appt for"unusual dizziness, Shortness of breath and headache right side"   When sent dot phrase questions he responded with 3 attached documents.   Please advise.

## 2023-09-24 NOTE — Telephone Encounter (Signed)
 Spoke with pt regarding his symptoms. Pt stated he attached documents to a MyChart message, however, those documents cannot be found in the chart. Pt stated he experienced unusual dizziness, sob and right sided headache yesterday. Today he no longer has any symptoms. Pt stated that his most recent blood pressure was 140s/70s. Pt was advised to go to the ED should those symptoms return. Pt was told the information he provided would be forwarded to Dr. Cherlyn Labella nurse. Pt verbalized understanding. All questions, if any, were answered.

## 2023-09-24 NOTE — Telephone Encounter (Signed)
 Pt thinks he is doing much better today. He was having afib episodes yesterday, overwhelming dizziness.  "It stopped me in my tracks and it went away as quickly as it came".   Happened twice yesterday, took 1/2 tablet of Propranolol yesterday, "Seems like it was coming and going all afternoon".  Pt was scheduled to see afib clinic 3/27.  Pt made aware the recordings he sent in were sent to MD for review/advisement. Patient verbalized understanding and agreeable to plan.

## 2023-09-25 NOTE — Telephone Encounter (Signed)
 Pt aware Dr. Elberta Fortis reviewed.  Advised to keep upcoming appt for further evaluation.  He understands to call office if things change/worsen prior to upcoming appt.

## 2023-10-09 ENCOUNTER — Ambulatory Visit (HOSPITAL_COMMUNITY)
Admission: RE | Admit: 2023-10-09 | Discharge: 2023-10-09 | Disposition: A | Discharge status: Home / Self Care | Payer: No Typology Code available for payment source | Source: Ambulatory Visit | Attending: Physician Assistant | Admitting: Physician Assistant

## 2023-10-09 VITALS — BP 146/76 | HR 55 | Ht 67.0 in | Wt 273.0 lb

## 2023-10-09 DIAGNOSIS — R001 Bradycardia, unspecified: Secondary | ICD-10-CM | POA: Diagnosis present

## 2023-10-09 DIAGNOSIS — Z6841 Body Mass Index (BMI) 40.0 and over, adult: Secondary | ICD-10-CM | POA: Diagnosis not present

## 2023-10-09 DIAGNOSIS — E669 Obesity, unspecified: Secondary | ICD-10-CM | POA: Diagnosis not present

## 2023-10-09 DIAGNOSIS — I4719 Other supraventricular tachycardia: Secondary | ICD-10-CM | POA: Diagnosis not present

## 2023-10-09 DIAGNOSIS — Z79899 Other long term (current) drug therapy: Secondary | ICD-10-CM | POA: Diagnosis not present

## 2023-10-09 DIAGNOSIS — G4733 Obstructive sleep apnea (adult) (pediatric): Secondary | ICD-10-CM | POA: Insufficient documentation

## 2023-10-09 DIAGNOSIS — I480 Paroxysmal atrial fibrillation: Secondary | ICD-10-CM | POA: Diagnosis present

## 2023-10-09 DIAGNOSIS — I10 Essential (primary) hypertension: Secondary | ICD-10-CM | POA: Insufficient documentation

## 2023-10-09 NOTE — Progress Notes (Signed)
 Primary Care Physician: Noberto Retort, MD Referring Physician: Dr. Elberta Fortis  Cardiologist: Dr. Edmonia James is a 63 y.o. male with a h/o paroxysmal afib, bradycardia, that was recently referred to Dr. Johney Frame from Dr. Herbie Baltimore, for monitor showing  predominate sinus rhythm, bursts of PAT as well as SVT/afib. He had a minimal  heart rate of 43 bpm, maximum 130 bpm, with an average of 64 bpm.   Pt is using flecainide and metoprolol for afib episodes which responds to the drugs pretty fast. Dr. Johney Frame suggested to use diltiazem daily as pt  did not tolerate the metoprolol which  would make him feel very fatigued.   Pt took the diltiazem x 3 days but then had a afib episode which lasted for most of the day, so he stopped drug. He was not aware that he could still use the prn flecainide with it. He is in SR today at 56 bpm. He states that when his back pain flares he notices more heart rhythm irregularity.   Follow up in the AF clinic 05/24/22. Patient was seen by Dr Elberta Fortis 02/2022 and started on propafenone. Patient reports that when he takes the medication BID his afib is well controlled. If he ever misses a dose, he notices tachypalpitations. He does report a significant increase in his anxiety about his health recently.   Follow up in the AF clinic 04/15/23. Patient reports that he will have an afib episode 1-3 times per week. Kardia strips personally reviewed today. He admits that he has only been taking his propafenone once daily. No bleeding issues on anticoagulation.   Follow up 10/09/23. Patient returns for follow up for atrial fibrillation. He reports that he is having more frequent afib for the past 1-2 months. He admits he still misses doses of propafenone. He has several episodes of afib per week, some lasting 30-45 minutes. He is very fatigued after the episodes.   Today, he denies symptoms of chest pain, shortness of breath, orthopnea, PND, lower extremity edema,  presyncope, syncope, bleeding, or neurologic sequela. The patient is tolerating medications without difficulties and is otherwise without complaint today.    Past Medical History:  Diagnosis Date   Arthritis    "lower back" (09/28/2015)   Atypical angina (HCC) dx'd 09/2015   GERD (gastroesophageal reflux disease)    High cholesterol    History of hiatal hernia    Hypertension    OSA on CPAP    PAF (paroxysmal atrial fibrillation) (HCC) 01/04/2021   Prediabetes    Seasonal allergies     Current Outpatient Medications  Medication Sig Dispense Refill   ASHWAGANDHA PO Take 3 tablets by mouth in the morning. WITH MAGNESIUM GLYCINATE-     ASPIRIN 81 PO Take 81 mg by mouth daily.     atorvastatin (LIPITOR) 40 MG tablet Take 40 mg by mouth every other day.     diltiazem (CARDIZEM CD) 120 MG 24 hr capsule TAKE 1 CAPSULE(120 MG) BY MOUTH DAILY 90 capsule 3   gabapentin (NEURONTIN) 300 MG capsule Take 300-600 mg by mouth 2 (two) times daily as needed (pain).     magnesium gluconate (MAGONATE) 500 MG tablet Take 500 mg by mouth at bedtime.     melatonin 3 MG TABS tablet Take 1 tablet by mouth at bedtime.     pantoprazole (PROTONIX) 20 MG tablet Take 20 mg by mouth daily.     propafenone (RYTHMOL SR) 325 MG 12 hr capsule Take 1  capsule (325 mg total) by mouth 2 (two) times daily. 60 capsule 11   propranolol (INDERAL) 20 MG tablet TAKE 1/2- 1 TABLET BY MOUTH AS NEEDED FOR PALPITATIONS (Patient taking differently: TAKING 10 MG AS NEEDED FOR PALPITATIONS) 90 tablet 3   tadalafil (CIALIS) 5 MG tablet Take 5 mg by mouth every evening.     valsartan (DIOVAN) 320 MG tablet TAKE 1 TABLET(320 MG) BY MOUTH DAILY 90 tablet 3   No current facility-administered medications for this encounter.    ROS- All systems are reviewed and negative except as per the HPI above  Physical Exam: Vitals:   10/09/23 1013  BP: (!) 146/76  Pulse: (!) 55  Weight: 123.8 kg  Height: 5\' 7"  (1.702 m)     Wt Readings  from Last 3 Encounters:  10/09/23 123.8 kg  04/15/23 122 kg  10/07/22 122 kg    GEN: Well nourished, well developed in no acute distress CARDIAC: Regular rate and rhythm, no murmurs, rubs, gallops RESPIRATORY:  Clear to auscultation without rales, wheezing or rhonchi  ABDOMEN: Soft, non-tender, non-distended EXTREMITIES:  No edema; No deformity     EKG today demonstrates SR, NST Vent. rate 55 BPM PR interval 200 ms QRS duration 106 ms QT/QTcB 428/409 ms   CHA2DS2-VASc Score = 1  The patient's score is based upon: CHF History: 0 HTN History: 1 Diabetes History: 0 Stroke History: 0 Vascular Disease History: 0 Age Score: 0 Gender Score: 0       ASSESSMENT AND PLAN: Paroxysmal Atrial Fibrillation/atrial tachycardia  The patient's CHA2DS2-VASc score is 1, indicating a 0.6% annual risk of stroke.   Patient in SR today but having frequent episodes of afib (Kardia mobile strips personally reviewed). We discussed rhythm control options including dofetilide and ablation. Unsure if he would be a good candidate for dofetilide with h/o missed doses of propafenone. He would like to discuss ablation with Dr Elberta Fortis. His BMI is borderline.  Continue propafenone 325 mg BID for now.  Continue diltiazem 120 mg daily Continue propranolol 25 mg PRN for palpitations  Not currently on anticoagulation with low CV score. He will need to start anticoagulation if ablation is pursued.    High Risk Medication Monitoring (ICD 10: Z79.899) Intervals on ECG acceptable for propafenone monitoring.      HTN Stable on current regimen  Obesity Body mass index is 42.76 kg/m.  Encouraged lifestyle modification  OSA  Encouraged nightly CPAP   Follow up with Dr Elberta Fortis to discuss rhythm control.    Jorja Loa PA-C Afib Clinic [facility] [address] [place], [place] [address] [phone]

## 2023-11-03 ENCOUNTER — Encounter: Payer: Self-pay | Admitting: Cardiology

## 2023-11-04 ENCOUNTER — Ambulatory Visit: Attending: Cardiology | Admitting: Cardiology

## 2023-11-04 ENCOUNTER — Encounter: Payer: Self-pay | Admitting: Cardiology

## 2023-11-04 VITALS — BP 164/90 | HR 64 | Ht 67.0 in | Wt 269.0 lb

## 2023-11-04 DIAGNOSIS — I10 Essential (primary) hypertension: Secondary | ICD-10-CM

## 2023-11-04 DIAGNOSIS — G4733 Obstructive sleep apnea (adult) (pediatric): Secondary | ICD-10-CM

## 2023-11-04 DIAGNOSIS — I483 Typical atrial flutter: Secondary | ICD-10-CM | POA: Diagnosis not present

## 2023-11-04 DIAGNOSIS — D6869 Other thrombophilia: Secondary | ICD-10-CM | POA: Diagnosis not present

## 2023-11-04 DIAGNOSIS — I480 Paroxysmal atrial fibrillation: Secondary | ICD-10-CM | POA: Diagnosis not present

## 2023-11-04 DIAGNOSIS — Z79899 Other long term (current) drug therapy: Secondary | ICD-10-CM

## 2023-11-04 NOTE — Progress Notes (Signed)
  Electrophysiology Office Note:   Date:  11/04/2023  ID:  Joshua Stanton, DOB 02/07/1961, MRN [id number]  Primary Cardiologist: Joshua Bustard, MD Primary Heart Failure: None Electrophysiologist: Joshua Violette Cortland Ding, MD      History of Present Illness:   Joshua Stanton is a 63 y.o. male with h/o hypertension, sleep apnea, atrial fibrillation, prediabetes seen today for routine electrophysiology followup.   Since last being seen in our clinic the patient reports worsening episode of atrial fibrillation.  He states that the episodes are lasting longer and are happening more frequently.  He feels fatigued and short of breath during these episodes.  He would likely prefer an alternative rhythm control strategy.  he denies chest pain, palpitations, dyspnea, PND, orthopnea, nausea, vomiting, dizziness, syncope, edema, weight gain, or early satiety.   Review of systems complete and found to be negative unless listed in HPI.   EP Information / Studies Reviewed:    EKG is not ordered today. EKG from 10/09/2023 reviewed which showed sinus rhythm        Risk Assessment/Calculations:    CHA2DS2-VASc Score = 1   This indicates a 0.6% annual risk of stroke. The patient's score is based upon: CHF History: 0 HTN History: 1 Diabetes History: 0 Stroke History: 0 Vascular Disease History: 0 Age Score: 0 Gender Score: 0             Physical Exam:   VS:  BP (!) 164/90 (BP Location: Left Arm, Patient Position: Sitting, Cuff Size: Large)   Pulse 64   Ht 5\' 7"  (1.702 m)   Wt 269 lb (122 kg)   SpO2 95%   BMI 42.13 kg/m    Wt Readings from Last 3 Encounters:  11/04/23 269 lb (122 kg)  10/09/23 273 lb (123.8 kg)  04/15/23 269 lb (122 kg)     GEN: Well nourished, well developed in no acute distress NECK: No JVD; No carotid bruits CARDIAC: Irregularly irregular rate and rhythm, no murmurs, rubs, gallops RESPIRATORY:  Clear to auscultation without rales, wheezing or rhonchi   ABDOMEN: Soft, non-tender, non-distended EXTREMITIES:  No edema; No deformity   ASSESSMENT AND PLAN:    1.  Paroxysmal atrial fibrillation/atrial tachycardia/atrial flutter: Currently not anticoagulated.  On propranolol  and propafenone .  He is continuing to have episodes of atrial fibrillation.  He would prefer an alternative rhythm control strategy.  Joshua Stanton give him information on ablation.  He Joshua Stanton call us  back if he wants to proceed.  If not, he may benefit from an alternative antiarrhythmic.  2.  Hypertension: Elevated today.  Adjustment per primary care  3.  Obstructive sleep apnea: CPAP compliance encouraged  4.  High risk medication monitoring: Currently on propafenone .  QRS remains narrow.  Follow up with Afib Clinic in 6 months  Signed, Joshua Witherington Cortland Ding, MD

## 2023-11-04 NOTE — Patient Instructions (Addendum)
 Medication Instructions:  Your physician recommends that you continue on your current medications as directed. Please refer to the Current Medication list given to you today.  *If you need a refill on your cardiac medications before your next appointment, please call your pharmacy*   Lab Work: None ordered If you have labs (blood work) drawn today and your tests are completely normal, you will receive your results only by: MyChart Message (if you have MyChart) OR A paper copy in the mail If you have any lab test that is abnormal or we need to change your treatment, we will call you to review the results.   Testing/Procedures: None ordered   Follow-Up: At [facility], you and your health needs are our priority.  As part of our continuing mission to provide you with exceptional heart care, we have created designated Provider Care Teams.  These Care Teams include your primary Cardiologist (physician) and Advanced Practice Providers (APPs -  Physician Assistants and Nurse Practitioners) who all work together to provide you with the care you need, when you need it.  We recommend signing up for the patient portal called "MyChart".  Sign up information is provided on this After Visit Summary.  MyChart is used to connect with patients for Virtual Visits (Telemedicine).  Patients are able to view lab/test results, encounter notes, upcoming appointments, etc.  Non-urgent messages can be sent to your provider as well.   To learn more about what you can do with MyChart, go to [email or web address].    Your next appointment:   6 month(s)  The format for your next appointment:   In Person  Provider:   You will follow up in the Atrial Fibrillation Clinic located at [facility]. Your provider will be: Clint R. Fenton, PA-C or Minnie Amber, PA-C    Thank you for choosing [employer]!!   Reece Cane, RN [phone]  Other Instructions  Please call the office if you  would like to schedule an ablation   Cardiac Ablation Cardiac ablation is a procedure to destroy (ablate) heart tissue that is sending bad signals. These bad signals cause the heart to beat very fast or in a way that is not normal. Destroying some tissues can help make the heart rhythm normal. Tell your doctor about: Any allergies you have. All medicines you are taking. These include vitamins, herbs, eye drops, creams, and over-the-counter medicines. Any problems you or family members have had with anesthesia. Any bleeding problems you have. Any surgeries you have had. Any medical conditions you have. Whether you are pregnant or may be pregnant. What are the risks? Your doctor will talk with you about risks. These may include: Infection. Bruising and bleeding. Stroke or blood clots. Damage to nearby areas of your body. Allergies to medicines or dyes. Needing a pacemaker if the heart gets damaged. A pacemaker helps the heart beat normally. The procedure not working. What happens before the procedure? Medicines Ask your doctor about changing or stopping: Your normal medicines. Vitamins, herbs, and supplements. Over-the-counter medicines. Do not take aspirin  or ibuprofen unless you are told to. General instructions Follow instructions from your doctor about what you may eat and drink. If you will be going home right after the procedure, plan to have a responsible adult: Take you home from the hospital or clinic. You will not be allowed to drive. Care for you for the time you are told. Ask your doctor what steps will be taken to prevent the spread of germs.  What happens during the procedure?  An IV tube will be put into one of your veins. You may be given: A sedative. This helps you relax. Anesthesia. This will: Numb certain areas of your body. The skin on your neck or groin will be numbed. A cut (incision) will be made in your neck or groin. A needle will be put through the  cut and into a large vein. The small, thin tube (catheter) will be put into the needle. The tube will be moved to your heart. A type of X-ray (fluoroscopy) will be used to help guide the tube. It will also show constant images of the heart on a screen. Dye may be put through the tube. This helps your doctor see your heart. An electric current will be sent from the tube to destroy heart tissue in certain areas. The tube will be taken out. Pressure will be held on your cut. This helps stop bleeding. A bandage (dressing) will be put over your cut. The procedure may vary among doctors and hospitals. What happens after the procedure? You will be monitored until you leave the hospital or clinic. This includes checking your blood pressure, heart rate and rhythm, breathing rate, and blood oxygen level. Your cut will be checked for bleeding. You will need to lie still for a few hours. If your groin was used, you will need to keep your leg straight for a few hours after the small, thin tube is removed. This information is not intended to replace advice given to you by your health care provider. Make sure you discuss any questions you have with your health care provider. Document Revised: 12/18/2021 Document Reviewed: 12/18/2021 Elsevier Patient Education  2024 [employer].

## 2023-12-26 ENCOUNTER — Encounter: Payer: Self-pay | Admitting: Cardiology

## 2024-01-07 ENCOUNTER — Other Ambulatory Visit: Payer: Self-pay

## 2024-01-07 DIAGNOSIS — I480 Paroxysmal atrial fibrillation: Secondary | ICD-10-CM

## 2024-01-08 ENCOUNTER — Encounter (HOSPITAL_COMMUNITY): Payer: Self-pay

## 2024-01-12 ENCOUNTER — Ambulatory Visit (HOSPITAL_COMMUNITY)
Admission: RE | Admit: 2024-01-12 | Discharge: 2024-01-12 | Disposition: A | Discharge status: Home / Self Care | Source: Ambulatory Visit | Attending: Physician Assistant | Admitting: Physician Assistant

## 2024-01-12 VITALS — BP 150/70 | HR 75 | Ht 67.0 in | Wt 273.0 lb

## 2024-01-12 DIAGNOSIS — I4891 Unspecified atrial fibrillation: Secondary | ICD-10-CM

## 2024-01-12 DIAGNOSIS — I480 Paroxysmal atrial fibrillation: Secondary | ICD-10-CM | POA: Diagnosis not present

## 2024-01-12 MED ORDER — AMIODARONE HCL 200 MG PO TABS: ORAL_TABLET | ORAL | 3 refills | Status: DC

## 2024-01-12 NOTE — Progress Notes (Addendum)
 Primary Care Physician: Joshua Elsie SAUNDERS, MD Referring Physician: Dr. Inocencio  Cardiologist: Dr. Anner  Primary EP: Dr Inocencio [name] is a 63 y.o. male with a h/o paroxysmal afib, bradycardia, that was recently referred to Dr. Kelsie from Dr. Anner, for monitor showing  predominate sinus rhythm, bursts of PAT as well as SVT/afib. He had a minimal  heart rate of 43 bpm, maximum 130 bpm, with an average of 64 bpm.   Pt is using flecainide  and metoprolol  for afib episodes which responds to the drugs pretty fast. Dr. Kelsie suggested to use diltiazem  daily as pt  did not tolerate the metoprolol  which  would make him feel very fatigued.   Pt took the diltiazem  x 3 days but then had a afib episode which lasted for most of the day, so he stopped drug. He was not aware that he could still use the prn flecainide  with it. He is in SR today at 56 bpm. He states that when his back pain flares he notices more heart rhythm irregularity.   Follow up in the AF clinic 05/24/22. Patient was seen by Dr Inocencio 02/2022 and started on propafenone . Patient reports that when he takes the medication BID his afib is well controlled. If he ever misses a dose, he notices tachypalpitations. He does report a significant increase in his anxiety about his health recently.   Follow up in the AF clinic 04/15/23. Patient reports that he will have an afib episode 1-3 times per week. Kardia strips personally reviewed today. He admits that he has only been taking his propafenone  once daily. No bleeding issues on anticoagulation.   Follow up 10/09/23. Patient returns for follow up for atrial fibrillation. He reports that he is having more frequent afib for the past 1-2 months. He admits he still misses doses of propafenone . He has several episodes of afib per week, some lasting 30-45 minutes. He is very fatigued after the episodes.   Follow up 01/12/24. Patient returns for follow up for atrial fibrillation and  propafenone  monitoring. He is in SR today with frequent PACs. He reports that he has symptoms of afib almost daily. He is scheduled for an ablation with Dr Inocencio in September.   Today, he  denies symptoms of chest pain, shortness of breath, orthopnea, PND, lower extremity edema, dizziness, presyncope, syncope, bleeding, or neurologic sequela. The patient is tolerating medications without difficulties and is otherwise without complaint today.    Past Medical History:  Diagnosis Date   Arthritis    lower back (09/28/2015)   Atypical angina (HCC) dx'd 09/2015   GERD (gastroesophageal reflux disease)    High cholesterol    History of hiatal hernia    Hypertension    OSA on CPAP    PAF (paroxysmal atrial fibrillation) (HCC) 01/04/2021   Prediabetes    Seasonal allergies     Current Outpatient Medications  Medication Sig Dispense Refill   ASHWAGANDHA PO Take 3 tablets by mouth in the morning. WITH MAGNESIUM GLYCINATE-     ASPIRIN  81 PO Take 81 mg by mouth daily.     atorvastatin  (LIPITOR ) 40 MG tablet Take 40 mg by mouth every other day.     diltiazem  (CARDIZEM  CD) 120 MG 24 hr capsule TAKE 1 CAPSULE(120 MG) BY MOUTH DAILY 90 capsule 3   gabapentin  (NEURONTIN ) 300 MG capsule Take 300-600 mg by mouth 2 (two) times daily as needed (pain).     magnesium gluconate (MAGONATE) 500 MG tablet  Take 500 mg by mouth at bedtime.     melatonin 3 MG TABS tablet Take 1 tablet by mouth at bedtime.     Multiple Vitamin (MULTIVITAMIN ADULT PO) Take 1 tablet by mouth every morning.     pantoprazole  (PROTONIX ) 20 MG tablet Take 20 mg by mouth daily.     propafenone  (RYTHMOL  SR) 325 MG 12 hr capsule Take 1 capsule (325 mg total) by mouth 2 (two) times daily. 60 capsule 11   propranolol  (INDERAL ) 20 MG tablet TAKE 1/2- 1 TABLET BY MOUTH AS NEEDED FOR PALPITATIONS (Patient taking differently: Taking 10 mg tablet- 1/2 - 1 tablet as needed for palpitations) 90 tablet 3   sildenafil (VIAGRA) 100 MG tablet Take  100 mg by mouth daily as needed.     tadalafil (CIALIS) 5 MG tablet Take 5 mg by mouth every evening. (Patient taking differently: Take 20 mg by mouth every evening.)     valsartan  (DIOVAN ) 320 MG tablet TAKE 1 TABLET(320 MG) BY MOUTH DAILY 90 tablet 3   No current facility-administered medications for this encounter.    ROS- All systems are reviewed and negative except as per the HPI above  Physical Exam: Vitals:   01/12/24 1006  BP: (!) 150/70  Pulse: 75  Weight: 123.8 kg  Height: 5' 7 (1.702 m)    Wt Readings from Last 3 Encounters:  01/12/24 123.8 kg  11/04/23 122 kg  10/09/23 123.8 kg    GEN: Well nourished, well developed in no acute distress NECK: No JVD; No carotid bruits CARDIAC: Regular rate and rhythm with occasional ectopy, no murmurs, rubs, gallops RESPIRATORY:  Clear to auscultation without rales, wheezing or rhonchi  ABDOMEN: Soft, non-tender, non-distended EXTREMITIES:  No edema; No deformity     EKG today demonstrates SR with bigeminal PACs Vent. rate 75 BPM PR interval 200 ms QRS duration 100 ms QT/QTcB 376/419 ms   CHA2DS2-VASc Score = 1  The patient's score is based upon: CHF History: 0 HTN History: 1 Diabetes History: 0 Stroke History: 0 Vascular Disease History: 0 Age Score: 0 Gender Score: 0       ASSESSMENT AND PLAN: Paroxysmal Atrial Fibrillation/atrial tachycardia/atrial flutter The patient's CHA2DS2-VASc score is 1, indicating a 0.6% annual risk of stroke.   Patient in SR today but having frequent episodes of afib. Scheduled for afib ablation 04/09/24. We discussed rhythm control options. Will stop propafenone  and start amiodarone 200 mg BID after a 3 day washout. Will continue BID x 4 weeks then decrease to once daily as a bridge to ablation.  Continue diltiazem  120 mg daily Continue propranolol  25 mg PRN for heart racing Not currently on anticoagulation with low CV score. Anticoagulation will be started prior to ablation.    High Risk Medication Monitoring (ICD 10: Z79.899) Stop propafenone  and start amiodarone as above.    HTN Mildly elevated today. Continue to monitor for now.  Obesity Body mass index is 42.76 kg/m.  Encouraged lifestyle modification  OSA  Encouraged nightly CPAP    Follow up in the AF clinic in 3 weeks.    Daril Kicks PA-C Afib Clinic [facility] [address] [place], [place] [address] [phone]

## 2024-01-12 NOTE — Patient Instructions (Signed)
 Stop propafenone    JULY 4TH FRIDAY start amiodarone 200 mg twice a day for 30 days then decrease 200 mg daily.

## 2024-01-14 ENCOUNTER — Other Ambulatory Visit: Payer: Self-pay

## 2024-01-14 DIAGNOSIS — I4819 Other persistent atrial fibrillation: Secondary | ICD-10-CM

## 2024-01-16 ENCOUNTER — Encounter: Payer: Self-pay | Admitting: Cardiology

## 2024-02-02 ENCOUNTER — Ambulatory Visit (HOSPITAL_COMMUNITY)
Admission: RE | Admit: 2024-02-02 | Discharge: 2024-02-02 | Disposition: A | Discharge status: Home / Self Care | Source: Ambulatory Visit | Attending: Physician Assistant | Admitting: Physician Assistant

## 2024-02-02 VITALS — BP 146/70 | HR 54 | Ht 67.0 in | Wt 276.8 lb

## 2024-02-02 DIAGNOSIS — I4891 Unspecified atrial fibrillation: Secondary | ICD-10-CM | POA: Diagnosis not present

## 2024-02-02 DIAGNOSIS — Z5181 Encounter for therapeutic drug level monitoring: Secondary | ICD-10-CM | POA: Diagnosis not present

## 2024-02-02 DIAGNOSIS — Z79899 Other long term (current) drug therapy: Secondary | ICD-10-CM

## 2024-02-02 DIAGNOSIS — I480 Paroxysmal atrial fibrillation: Secondary | ICD-10-CM

## 2024-02-02 MED ORDER — RIVAROXABAN 20 MG PO TABS: 20.0000 mg | ORAL_TABLET | Freq: Every day | ORAL | 3 refills | Status: DC

## 2024-02-02 NOTE — Progress Notes (Signed)
 Primary Care Physician: Arloa Elsie SAUNDERS, MD Referring Physician: Dr. Inocencio  Cardiologist: Dr. Anner  Primary EP: Dr Inocencio [name] is a 63 y.o. male with a h/o paroxysmal afib, bradycardia, that was recently referred to Dr. Kelsie from Dr. Anner, for monitor showing  predominate sinus rhythm, bursts of PAT as well as SVT/afib. He had a minimal  heart rate of 43 bpm, maximum 130 bpm, with an average of 64 bpm.   Pt is using flecainide  and metoprolol  for afib episodes which responds to the drugs pretty fast. Dr. Kelsie suggested to use diltiazem  daily as pt  did not tolerate the metoprolol  which  would make him feel very fatigued.   Pt took the diltiazem  x 3 days but then had a afib episode which lasted for most of the day, so he stopped drug. He was not aware that he could still use the prn flecainide  with it. He is in SR today at 56 bpm. He states that when his back pain flares he notices more heart rhythm irregularity.   Follow up in the AF clinic 05/24/22. Patient was seen by Dr Inocencio 02/2022 and started on propafenone . Patient reports that when he takes the medication BID his afib is well controlled. If he ever misses a dose, he notices tachypalpitations. He does report a significant increase in his anxiety about his health recently.   Follow up in the AF clinic 04/15/23. Patient reports that he will have an afib episode 1-3 times per week. Kardia strips personally reviewed today. He admits that he has only been taking his propafenone  once daily. No bleeding issues on anticoagulation.   Follow up 10/09/23. Patient returns for follow up for atrial fibrillation. He reports that he is having more frequent afib for the past 1-2 months. He admits he still misses doses of propafenone . He has several episodes of afib per week, some lasting 30-45 minutes. He is very fatigued after the episodes.   Follow up 01/12/24. Patient returns for follow up for atrial fibrillation and  propafenone  monitoring. He is in SR today with frequent PACs. He reports that he has symptoms of afib almost daily. He is scheduled for an ablation with Dr Inocencio in September.   Follow up 02/02/24. Patient returns for follow up for atrial fibrillation and amiodarone  monitoring. He reports since starting amiodarone  he has had much fewer afib episodes. He remains in SR today. He is tolerating the medication without difficulty.   Today, he  denies symptoms of chest pain, shortness of breath, orthopnea, PND, lower extremity edema, dizziness, presyncope, syncope, bleeding, or neurologic sequela. The patient is tolerating medications without difficulties and is otherwise without complaint today.    Past Medical History:  Diagnosis Date   Arthritis    lower back (09/28/2015)   Atypical angina (HCC) dx'd 09/2015   GERD (gastroesophageal reflux disease)    High cholesterol    History of hiatal hernia    Hypertension    OSA on CPAP    PAF (paroxysmal atrial fibrillation) (HCC) 01/04/2021   Prediabetes    Seasonal allergies     Current Outpatient Medications  Medication Sig Dispense Refill   amiodarone  (PACERONE ) 200 MG tablet Take 1 tablet (200 mg total) by mouth 2 (two) times daily for 30 days, THEN 1 tablet (200 mg total) daily. 60 tablet 3   ASHWAGANDHA PO Take 3 tablets by mouth in the morning. WITH MAGNESIUM GLYCINATE-     ASPIRIN  81 PO Take 81  mg by mouth daily.     atorvastatin  (LIPITOR ) 40 MG tablet Take 40 mg by mouth every other day.     diltiazem  (CARDIZEM  CD) 120 MG 24 hr capsule TAKE 1 CAPSULE(120 MG) BY MOUTH DAILY 90 capsule 3   gabapentin  (NEURONTIN ) 300 MG capsule Take 300-600 mg by mouth 2 (two) times daily as needed (pain).     magnesium gluconate (MAGONATE) 500 MG tablet Take 500 mg by mouth at bedtime.     melatonin 3 MG TABS tablet Take 1 tablet by mouth at bedtime.     Multiple Vitamin (MULTIVITAMIN ADULT PO) Take 1 tablet by mouth every morning.     pantoprazole   (PROTONIX ) 20 MG tablet Take 20 mg by mouth daily.     propranolol  (INDERAL ) 20 MG tablet TAKE 1/2- 1 TABLET BY MOUTH AS NEEDED FOR PALPITATIONS (Patient taking differently: Take 10 mg by mouth as needed.) 90 tablet 3   sildenafil (VIAGRA) 100 MG tablet Take 100 mg by mouth daily as needed. (Patient taking differently: Take 100 mg by mouth as needed.)     tadalafil (CIALIS) 5 MG tablet Take 5 mg by mouth every evening.     valsartan  (DIOVAN ) 320 MG tablet TAKE 1 TABLET(320 MG) BY MOUTH DAILY 90 tablet 3   No current facility-administered medications for this encounter.    ROS- All systems are reviewed and negative except as per the HPI above  Physical Exam: Vitals:   02/02/24 1027  BP: (!) 146/70  Pulse: (!) 54  Weight: 125.6 kg  Height: 5' 7 (1.702 m)    Wt Readings from Last 3 Encounters:  02/02/24 125.6 kg  01/12/24 123.8 kg  11/04/23 122 kg    GEN: Well nourished, well developed in no acute distress CARDIAC: Regular rate and rhythm, no murmurs, rubs, gallops RESPIRATORY:  Clear to auscultation without rales, wheezing or rhonchi  ABDOMEN: Soft, non-tender, non-distended EXTREMITIES:  No edema; No deformity     EKG today demonstrates SB Vent. rate 54 BPM PR interval 200 ms QRS duration 106 ms QT/QTcB 460/436 ms   CHA2DS2-VASc Score = 1  The patient's score is based upon: CHF History: 0 HTN History: 1 Diabetes History: 0 Stroke History: 0 Vascular Disease History: 0 Age Score: 0 Gender Score: 0       ASSESSMENT AND PLAN: Paroxysmal Atrial Fibrillation/atrial flutter/atrial tach (ICD10:  I48.0) The patient's CHA2DS2-VASc score is 1, indicating a 0.6% annual risk of stroke.   Patient in SR today Scheduled for afib ablation 04/09/24 Continue amiodarone  200 mg BID until 8/1 then decrease to once daily. Will start Xarelto  20 mg daily and stop ASA in preparation for ablation. Continue diltiazem  120 mg daily Continue propranolol  25 mg PRN for heart  racing  High Risk Medication Monitoring (ICD 10: Z79.899) Intervals on ECG acceptable for amiodarone  monitoring.   HTN Stable on current regimen  Obesity Body mass index is 43.35 kg/m.  Encouraged lifestyle modification  OSA  Encouraged nightly CPAP    Follow up for afib ablation as scheduled.    Daril Kicks PA-C [facility] [address] [place], [place] [address] [phone]

## 2024-02-02 NOTE — Patient Instructions (Signed)
 Stop aspirin    Start Xarelto  20 mg once a day with supper   02/13/24 decrease amiodarone  to 200 mg ONCE a day

## 2024-02-11 ENCOUNTER — Encounter: Payer: Self-pay | Admitting: Cardiology

## 2024-03-11 ENCOUNTER — Emergency Department (HOSPITAL_BASED_OUTPATIENT_CLINIC_OR_DEPARTMENT_OTHER): Admitting: Radiology

## 2024-03-11 ENCOUNTER — Inpatient Hospital Stay (HOSPITAL_BASED_OUTPATIENT_CLINIC_OR_DEPARTMENT_OTHER)
Admission: EM | Admit: 2024-03-11 | Discharge: 2024-03-12 | DRG: 309 | Disposition: A | Discharge status: Home / Self Care | Attending: Cardiology | Admitting: Cardiology

## 2024-03-11 ENCOUNTER — Telehealth: Admitting: Physician Assistant

## 2024-03-11 ENCOUNTER — Encounter (HOSPITAL_BASED_OUTPATIENT_CLINIC_OR_DEPARTMENT_OTHER): Payer: Self-pay | Admitting: Emergency Medicine

## 2024-03-11 ENCOUNTER — Ambulatory Visit
Admission: EM | Admit: 2024-03-11 | Discharge: 2024-03-11 | Disposition: A | Discharge status: Home / Self Care | Attending: Physician Assistant | Admitting: Physician Assistant

## 2024-03-11 ENCOUNTER — Other Ambulatory Visit: Payer: Self-pay

## 2024-03-11 DIAGNOSIS — I480 Paroxysmal atrial fibrillation: Secondary | ICD-10-CM | POA: Diagnosis not present

## 2024-03-11 DIAGNOSIS — Z87891 Personal history of nicotine dependence: Secondary | ICD-10-CM

## 2024-03-11 DIAGNOSIS — R079 Chest pain, unspecified: Secondary | ICD-10-CM | POA: Diagnosis not present

## 2024-03-11 DIAGNOSIS — E7849 Other hyperlipidemia: Secondary | ICD-10-CM | POA: Diagnosis present

## 2024-03-11 DIAGNOSIS — I4891 Unspecified atrial fibrillation: Secondary | ICD-10-CM

## 2024-03-11 DIAGNOSIS — Z818 Family history of other mental and behavioral disorders: Secondary | ICD-10-CM

## 2024-03-11 DIAGNOSIS — I2489 Other forms of acute ischemic heart disease: Secondary | ICD-10-CM | POA: Diagnosis present

## 2024-03-11 DIAGNOSIS — Z8249 Family history of ischemic heart disease and other diseases of the circulatory system: Secondary | ICD-10-CM

## 2024-03-11 DIAGNOSIS — R002 Palpitations: Secondary | ICD-10-CM

## 2024-03-11 DIAGNOSIS — K219 Gastro-esophageal reflux disease without esophagitis: Secondary | ICD-10-CM | POA: Diagnosis present

## 2024-03-11 DIAGNOSIS — E119 Type 2 diabetes mellitus without complications: Secondary | ICD-10-CM | POA: Diagnosis present

## 2024-03-11 DIAGNOSIS — Z7901 Long term (current) use of anticoagulants: Secondary | ICD-10-CM

## 2024-03-11 DIAGNOSIS — Z8679 Personal history of other diseases of the circulatory system: Secondary | ICD-10-CM

## 2024-03-11 DIAGNOSIS — R7989 Other specified abnormal findings of blood chemistry: Secondary | ICD-10-CM | POA: Diagnosis present

## 2024-03-11 DIAGNOSIS — I10 Essential (primary) hypertension: Secondary | ICD-10-CM | POA: Diagnosis present

## 2024-03-11 DIAGNOSIS — E7800 Pure hypercholesterolemia, unspecified: Secondary | ICD-10-CM | POA: Diagnosis present

## 2024-03-11 DIAGNOSIS — Z6841 Body Mass Index (BMI) 40.0 and over, adult: Secondary | ICD-10-CM

## 2024-03-11 DIAGNOSIS — I214 Non-ST elevation (NSTEMI) myocardial infarction: Secondary | ICD-10-CM | POA: Diagnosis present

## 2024-03-11 DIAGNOSIS — Z885 Allergy status to narcotic agent status: Secondary | ICD-10-CM

## 2024-03-11 DIAGNOSIS — Z888 Allergy status to other drugs, medicaments and biological substances status: Secondary | ICD-10-CM

## 2024-03-11 DIAGNOSIS — Z79899 Other long term (current) drug therapy: Secondary | ICD-10-CM

## 2024-03-11 DIAGNOSIS — E8881 Metabolic syndrome: Secondary | ICD-10-CM | POA: Diagnosis present

## 2024-03-11 DIAGNOSIS — G4733 Obstructive sleep apnea (adult) (pediatric): Secondary | ICD-10-CM | POA: Diagnosis present

## 2024-03-11 DIAGNOSIS — R0789 Other chest pain: Secondary | ICD-10-CM | POA: Diagnosis not present

## 2024-03-11 LAB — BASIC METABOLIC PANEL WITH GFR
Anion gap: 11 (ref 5–15)
BUN: 14 mg/dL (ref 8–23)
CO2: 27 mmol/L (ref 22–32)
Calcium: 9.4 mg/dL (ref 8.9–10.3)
Chloride: 104 mmol/L (ref 98–111)
Creatinine, Ser: 1.24 mg/dL (ref 0.61–1.24)
GFR, Estimated: >60 mL/min (ref >60)
Glucose, Bld: 92 mg/dL (ref 70–99)
Potassium: 3.9 mmol/L (ref 3.5–5.1)
Sodium: 142 mmol/L (ref 135–145)

## 2024-03-11 LAB — CBC
HCT: 42.6 % (ref 39.0–52.0)
Hemoglobin: 13.7 g/dL (ref 13.0–17.0)
MCH: 29.1 pg (ref 26.0–34.0)
MCHC: 32.2 g/dL (ref 30.0–36.0)
MCV: 90.4 fL (ref 80.0–100.0)
Platelets: 228 K/uL (ref 150–400)
RBC: 4.71 MIL/uL (ref 4.22–5.81)
RDW: 13.2 % (ref 11.5–15.5)
WBC: 7.7 K/uL (ref 4.0–10.5)
nRBC: 0 % (ref 0.0–0.2)

## 2024-03-11 LAB — TROPONIN T, HIGH SENSITIVITY
Troponin T High Sensitivity: 31 ng/L — ABNORMAL HIGH (ref 0–19)
Troponin T High Sensitivity: 25 ng/L — ABNORMAL HIGH (ref 0–19)

## 2024-03-11 MED ORDER — HEPARIN (PORCINE) 25000 UT/250ML-% IV SOLN
1500.0000 [IU]/h | INTRAVENOUS | Status: DC
  Administered 2024-03-12: 1400 [IU]/h via INTRAVENOUS
  Filled 2024-03-11: qty 250

## 2024-03-11 MED ORDER — ASPIRIN 81 MG PO CHEW
324.0000 mg | CHEWABLE_TABLET | Freq: Once | ORAL | Status: AC
  Administered 2024-03-11: 324 mg via ORAL
  Filled 2024-03-11: qty 4

## 2024-03-11 NOTE — ED Notes (Signed)
 [facility] with [facility] for transport

## 2024-03-11 NOTE — ED Notes (Signed)
 EKG handed off to [employer] PA at this time.

## 2024-03-11 NOTE — ED Provider Notes (Signed)
 [place] EMERGENCY DEPARTMENT AT [facility] Provider Note   CSN: [id number] Arrival date & time: 03/11/24  1947     Patient presents with: Chest Pain   Joshua Stanton is a 63 y.o. male.   Patient complains of pain in his chest.  Patient reports that the pain occurred today between noon and 5 PM.  Patient reports he is not currently having pain.  Patient feels like he has been in and out of atrial fibrillation all day today.  Patient states he is heart rate has seemed fast at times.  He can tell that his heart rate is irregular.  He tells me that he is scheduled to have an ablation.  Patient reports he took a dose of propranolol  this afternoon and he has not had symptoms since.  Patient reports he became concerned because he had the pain in the left side of his chest and he felt it going down his left arm.  Patient denies any other symptoms he has not had a cough or cold patient denies any fever or chills no shortness of breath.  The history is provided by the patient. No language interpreter was used.  Chest Pain Pain location:  L chest Pain quality: aching   Pain radiates to:  L arm Pain severity:  Moderate Timing:  Constant Progression:  Worsening Chronicity:  New Relieved by: propranolo. Worsened by:  Nothing Ineffective treatments:  None tried Associated symptoms: weakness   Associated symptoms: no nausea   Risk factors: diabetes mellitus, high cholesterol, male sex and obesity        Prior to Admission medications   Medication Sig Start Date End Date Taking? Authorizing Provider  amiodarone  (PACERONE ) 200 MG tablet Take 1 tablet (200 mg total) by mouth 2 (two) times daily for 30 days, THEN 1 tablet (200 mg total) daily. 01/16/24 02/14/25  Fenton, Clint R, PA  ASHWAGANDHA PO Take 3 tablets by mouth in the morning. WITH MAGNESIUM GLYCINATE-    [provider]  atorvastatin  (LIPITOR ) 40 MG tablet Take 40 mg by mouth every other day.    [provider]  diltiazem  (CARDIZEM  CD) 120 MG 24 hr capsule TAKE 1 CAPSULE(120 MG) BY MOUTH DAILY 02/25/22   Anner Alm ORN, MD  gabapentin  (NEURONTIN ) 300 MG capsule Take 300-600 mg by mouth 2 (two) times daily as needed (pain).    [provider]  magnesium gluconate (MAGONATE) 500 MG tablet Take 500 mg by mouth at bedtime.    [provider]  melatonin 3 MG TABS tablet Take 1 tablet by mouth at bedtime.    [provider]  Multiple Vitamin (MULTIVITAMIN ADULT PO) Take 1 tablet by mouth every morning.    [provider]  pantoprazole  (PROTONIX ) 20 MG tablet Take 20 mg by mouth daily. 03/07/23   [provider]  propranolol  (INDERAL ) 20 MG tablet TAKE 1/2- 1 TABLET BY MOUTH AS NEEDED FOR PALPITATIONS Patient taking differently: Take 10 mg by mouth as needed. 01/29/22   Anner Alm ORN, MD  rivaroxaban  (XARELTO ) 20 MG TABS tablet Take 1 tablet (20 mg total) by mouth daily with supper. 02/02/24   Fenton, Clint R, PA  sildenafil (VIAGRA) 100 MG tablet Take 100 mg by mouth daily as needed. Patient taking differently: Take 100 mg by mouth as needed. 08/12/22   [provider]  tadalafil (CIALIS) 5 MG tablet Take 5 mg by mouth every evening.    [provider]  valsartan  (DIOVAN ) 320 MG  tablet TAKE 1 TABLET(320 MG) BY MOUTH DAILY 01/29/23   Camnitz, Soyla Lunger, MD    Allergies: Morphine , Carvedilol , Hydrocodone -acetaminophen , Oxycodone , and Trazodone hcl    Review of Systems  Cardiovascular:  Positive for chest pain.  Gastrointestinal:  Negative for nausea.  Neurological:  Positive for weakness.  All other systems reviewed and are negative.   Updated Vital Signs BP (!) 160/95   Pulse (!) 52   Temp 98.4 F (36.9 C) (Oral)   Resp 13   SpO2 95%   Physical Exam Vitals reviewed.  Constitutional:      Appearance: He is well-developed.  Cardiovascular:     Rate and Rhythm: Normal rate and regular rhythm.     Heart sounds: Normal  heart sounds.  Pulmonary:     Effort: Pulmonary effort is normal.     Breath sounds: Normal breath sounds.  Abdominal:     Palpations: Abdomen is soft.  Musculoskeletal:        General: Normal range of motion.     Cervical back: Normal range of motion.  Skin:    General: Skin is warm.  Neurological:     General: No focal deficit present.     Mental Status: He is alert.  Psychiatric:        Mood and Affect: Mood normal.     (all labs ordered are listed, but only abnormal results are displayed) Labs Reviewed  TROPONIN T, HIGH SENSITIVITY - Abnormal; Notable for the following components:      Result Value   Troponin T High Sensitivity 31 (*)    All other components within normal limits  BASIC METABOLIC PANEL WITH GFR  CBC  TROPONIN T, HIGH SENSITIVITY    EKG: None  Radiology: DG Chest 2 View Result Date: 03/11/2024 CLINICAL DATA:  Chest pain. EXAM: CHEST - 2 VIEW COMPARISON:  Chest radiograph dated 03/25/2020. FINDINGS: No focal consolidation, pleural effusion, or pneumothorax. Stable cardiac silhouette. No acute osseous pathology. IMPRESSION: No active cardiopulmonary disease. Electronically Signed   By: Vanetta Chou M.D.   On: 03/11/2024 20:18     Procedures   Medications Ordered in the ED - No data to display                                  Medical Decision Making Patient reports experience chest pain between noon and 5:00 today.  He also felt like he was in atrial fibrillation.  Patient reports symptoms resolved with taking propranolol   Amount and/or Complexity of Data Reviewed Labs: ordered. Decision-making details documented in ED Course.    Details: Labs ordered reviewed and interpreted first troponin is elevated to 31 Radiology: ordered and independent interpretation performed. Decision-making details documented in ED Course.    Details: Chest x-ray shows no acute findings  Risk Risk Details: Pt's care turned over to [facility].  Second troponin  pending       Final diagnoses:  Chest pain, unspecified type  Atrial fibrillation, unspecified type [facility])    ED Discharge Orders     None          Flint Sonny MARLA DEVONNA 03/11/24 2159    Armenta Canning, MD 03/12/24 [phone]

## 2024-03-11 NOTE — ED Notes (Signed)
 Patient is being discharged from the Urgent Care and sent to the Emergency Department via private vehicle (refused EMS transport). Per Rocky Mecum PA, patient is in need of higher level of care due to left-sided chest pain, SOB, dizziness, and lightheadedness. Patient is aware and verbalizes understanding of plan of care.   Vitals:   03/11/24 1809  BP: (!) 144/78  Pulse: (!) 55  Resp: 20  Temp: 98.4 F (36.9 C)  SpO2: 95%

## 2024-03-11 NOTE — Progress Notes (Signed)
 Virtual Visit Consent   Joshua Stanton, you are scheduled for a virtual visit with a [facility] provider today. Just as with appointments in the office, your consent must be obtained to participate. Your consent will be active for this visit and any virtual visit you may have with one of our providers in the next 365 days. If you have a MyChart account, a copy of this consent can be sent to you electronically.  As this is a virtual visit, video technology does not allow for your provider to perform a traditional examination. This may limit your provider's ability to fully assess your condition. If your provider identifies any concerns that need to be evaluated in person or the need to arrange testing (such as labs, EKG, etc.), we will make arrangements to do so. Although advances in technology are sophisticated, we cannot ensure that it will always work on either your end or our end. If the connection with a video visit is poor, the visit may have to be switched to a telephone visit. With either a video or telephone visit, we are not always able to ensure that we have a secure connection.  By engaging in this virtual visit, you consent to the provision of healthcare and authorize for your insurance to be billed (if applicable) for the services provided during this visit. Depending on your insurance coverage, you may receive a charge related to this service.  I need to obtain your verbal consent now. Are you willing to proceed with your visit today? Jonmichael Beadnell Osley has provided verbal consent on 03/11/2024 for a virtual visit (video or telephone). Joshua Stanton, [place]  Date: 03/11/2024 4:59 PM   Virtual Visit via Video Note   I, Joshua Stanton, connected with  [name]  ([id number], 1960-09-22) on 03/11/24 at  4:45 PM EDT by a video-enabled telemedicine application and verified that I am speaking with the correct person using two identifiers.  Location: Patient:  Virtual Visit Location Patient: Home Provider: Virtual Visit Location Provider: Home Office   I discussed the limitations of evaluation and management by telemedicine and the availability of in person appointments. The patient expressed understanding and agreed to proceed.    History of Present Illness: Joshua Stanton is a 63 y.o. who identifies as a male who was assigned male at birth, and is being seen today for atypical heart palpitations noted throughout the day today.  Patient with history of atrial fibrillation, currently on a regimen of diltiazem  120 mg daily, amiodarone  200 mg twice daily, propranolol  10 mg as needed and Xarelto  20 mg daily.  Patient endorses his monitor noting A-fib but also runs of SVT and ectopic beats.  Notes the highest his heart rate his show him on his monitor was 124.  Has had some associated mild chest pain that is radiating into his left arm at times.  Notes this is nonexertional however.  Denies radiation into the jaw.  Denies any new onset shortness of breath, lightheadedness or diaphoresis.  Blood pressure currently 153/81.  Last heart rate check at 61 bpm.  He is asymptomatic at present.  Has not spoken to his cardiologist.   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/2021   Type 2 diabetes mellitus with other specified complication (HCC) 09/12/2021   Vitamin D deficiency 09/12/2021   Type 2 diabetes mellitus without complication (HCC) 09/12/2021   Prostate cancer (HCC) 08/30/2021   Malignant neoplasm of prostate (HCC) 06/26/2021   Preop cardiovascular  exam 06/20/2021   Paroxysmal atrial fibrillation (HCC) 01/04/2021   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   Tinnitus, bilateral 07/24/2020   Hyperlipidemia due to dietary fat intake 01/13/2020   DOE (dyspnea on exertion) 01/13/2020   Lower extremity edema 01/13/2020   Intolerance, food 11/22/2019   Nonallergic rhinitis 11/22/2019   Pain in left knee 07/30/2019   Abdominal pain, epigastric 03/09/2019   Esophageal reflux 03/09/2019   Nausea 03/09/2019   PAT (paroxysmal atrial tachycardia) (HCC) 11/30/2018   Essential hypertension 09/21/2015   Obstructive sleep apnea 09/21/2015   Abnormal finding on EKG - anterolateral biphasic T waves with ST depression 09/21/2015   Acute medial meniscal tear 04/06/2013    Allergies:  Allergies  Allergen Reactions   Morphine  Nausea And Vomiting   Carvedilol  Other (See Comments)    Makes patient feel like his moving in slow motion   Hydrocodone -Acetaminophen      Other reaction(s): feels like a hang over   Oxycodone  Nausea Only   Trazodone Hcl     Other reaction(s): priapism   Medications:  Current Outpatient Medications:    amiodarone  (PACERONE ) 200 MG tablet, Take 1 tablet (200 mg total) by mouth 2 (two) times daily for 30 days, THEN 1 tablet (200 mg total) daily., Disp: 60 tablet, Rfl: 3   ASHWAGANDHA PO, Take 3 tablets by mouth in the morning. WITH MAGNESIUM GLYCINATE-, Disp: , Rfl:    atorvastatin  (LIPITOR ) 40 MG tablet, Take 40 mg by mouth every other day., Disp: , Rfl:    diltiazem  (CARDIZEM  CD) 120 MG 24 hr capsule, TAKE 1 CAPSULE(120 MG) BY MOUTH DAILY, Disp: 90 capsule, Rfl: 3   gabapentin  (NEURONTIN ) 300 MG capsule, Take 300-600 mg by mouth 2 (two) times daily as needed (pain)., Disp: , Rfl:    magnesium gluconate (MAGONATE) 500 MG tablet, Take 500 mg by mouth at bedtime., Disp: , Rfl:    melatonin 3 MG TABS tablet, Take 1 tablet by mouth at bedtime., Disp: , Rfl:    Multiple Vitamin (MULTIVITAMIN ADULT PO), Take 1 tablet  by mouth every morning., Disp: , Rfl:    pantoprazole  (PROTONIX ) 20 MG tablet, Take 20 mg by mouth daily., Disp: , Rfl:    propranolol  (INDERAL ) 20 MG tablet, TAKE 1/2- 1 TABLET BY MOUTH AS NEEDED FOR PALPITATIONS (Patient taking differently: Take 10 mg by mouth as needed.), Disp: 90 tablet, Rfl: 3   rivaroxaban  (XARELTO ) 20 MG TABS tablet, Take 1 tablet (20 mg total) by mouth daily with supper., Disp: 30 tablet, Rfl: 3   sildenafil (VIAGRA) 100 MG tablet, Take 100 mg by mouth daily as needed. (Patient taking differently: Take 100 mg by mouth as needed.), Disp: , Rfl:    tadalafil (CIALIS) 5 MG tablet, Take 5 mg by mouth every evening., Disp: , Rfl:    valsartan  (DIOVAN ) 320 MG tablet, TAKE 1 TABLET(320 MG) BY MOUTH DAILY, Disp: 90 tablet, Rfl: 3  Observations/Objective: Patient is well-developed, well-nourished in no acute distress.  Resting comfortably  at home.  Head is normocephalic, atraumatic.  No labored breathing.  Speech is clear and  coherent with logical content.  Patient is alert and oriented at baseline.   Assessment and Plan: 1. Palpitations (Primary)  2. Chest pain, unspecified type  3. Paroxysmal atrial fibrillation (HCC)  Discussed giving typical heart rhythm/palpitations for him, especially associated with chest discomfort (and even though described is not exertional), he needs an evaluation ASAP.  Asymptomatic at present. He agrees to be seen at the [facility] emergency room as this is close to his house.  Has a family member that will drive him there immediately.  EMS precautions discussed.  Follow Up Instructions: I discussed the assessment and treatment plan with the patient. The patient was provided an opportunity to ask questions and all were answered. The patient agreed with the plan and demonstrated an understanding of the instructions.  A copy of instructions were sent to the patient via MyChart unless otherwise noted below.   The patient was advised to  call back or seek an in-person evaluation if the symptoms worsen or if the condition fails to improve as anticipated.    Joshua Velma Lunger, PA-C

## 2024-03-11 NOTE — Patient Instructions (Signed)
 Joshua Stanton, thank you for joining Joshua Velma Lunger, PA-C for today's virtual visit.  While this provider is not your primary care provider (PCP), if your PCP is located in our provider database this encounter information will be shared with them immediately following your visit.   A [facility] MyChart account gives you access to today's visit and all your visits, tests, and labs performed at [facility]  click here if you don't have a [facility] MyChart account or go to [email or web address]  Consent: (Patient) Joshua Stanton provided verbal consent for this virtual visit at the beginning of the encounter.  Current Medications:  Current Outpatient Medications:    amiodarone  (PACERONE ) 200 MG tablet, Take 1 tablet (200 mg total) by mouth 2 (two) times daily for 30 days, THEN 1 tablet (200 mg total) daily., Disp: 60 tablet, Rfl: 3   ASHWAGANDHA PO, Take 3 tablets by mouth in the morning. WITH MAGNESIUM GLYCINATE-, Disp: , Rfl:    atorvastatin  (LIPITOR ) 40 MG tablet, Take 40 mg by mouth every other day., Disp: , Rfl:    diltiazem  (CARDIZEM  CD) 120 MG 24 hr capsule, TAKE 1 CAPSULE(120 MG) BY MOUTH DAILY, Disp: 90 capsule, Rfl: 3   gabapentin  (NEURONTIN ) 300 MG capsule, Take 300-600 mg by mouth 2 (two) times daily as needed (pain)., Disp: , Rfl:    magnesium gluconate (MAGONATE) 500 MG tablet, Take 500 mg by mouth at bedtime., Disp: , Rfl:    melatonin 3 MG TABS tablet, Take 1 tablet by mouth at bedtime., Disp: , Rfl:    Multiple Vitamin (MULTIVITAMIN ADULT PO), Take 1 tablet by mouth every morning., Disp: , Rfl:    pantoprazole  (PROTONIX ) 20 MG tablet, Take 20 mg by mouth daily., Disp: , Rfl:    propranolol  (INDERAL ) 20 MG tablet, TAKE 1/2- 1 TABLET BY MOUTH AS NEEDED FOR PALPITATIONS (Patient taking differently: Take 10 mg by mouth as needed.), Disp: 90 tablet, Rfl: 3   rivaroxaban  (XARELTO ) 20 MG TABS tablet, Take 1 tablet (20 mg total) by mouth daily with  supper., Disp: 30 tablet, Rfl: 3   sildenafil (VIAGRA) 100 MG tablet, Take 100 mg by mouth daily as needed. (Patient taking differently: Take 100 mg by mouth as needed.), Disp: , Rfl:    tadalafil (CIALIS) 5 MG tablet, Take 5 mg by mouth every evening., Disp: , Rfl:    valsartan  (DIOVAN ) 320 MG tablet, TAKE 1 TABLET(320 MG) BY MOUTH DAILY, Disp: 90 tablet, Rfl: 3   Medications ordered in this encounter:  No orders of the defined types were placed in this encounter.    *If you need refills on other medications prior to your next appointment, please contact your pharmacy*  Follow-Up: Call back or seek an in-person evaluation if the symptoms worsen or if the condition fails to improve as anticipated.  [facility] [phone]  Other Instructions Based on what you shared with me, I feel your condition warrants further evaluation as soon as possible at an Emergency department.   [place]  Emergency Department- [facility]  Get Driving Directions  [address]  [place], [place] [address]  Open 24/7/365      If you have been instructed to have an in-person evaluation today at a local Urgent Care facility, please use the link below. It will take you to a list of all of our available [facility], including address, phone number and hours of operation. Please do not delay  care.  [facility]  If you or a family member do not have a primary care provider, use the link below to schedule a visit and establish care. When you choose a [facility] primary care physician or advanced practice provider, you gain a long-term partner in health. Find a Primary Care Provider  Learn more about [facility] in-office and virtual care options:  - Get Care Now

## 2024-03-11 NOTE — ED Provider Notes (Signed)
 [facility]    CSN: [id number] Arrival date & time: 03/11/24  1753      History   Chief Complaint Chief Complaint  Patient presents with   Chest Pain   Irregular Heart Beat    HPI Joshua Stanton is a 63 y.o. male.  Past Medical History:  Diagnosis Date   Arthritis    lower back (09/28/2015)   Atypical angina (HCC) dx'd 09/2015   GERD (gastroesophageal reflux disease)    High cholesterol    History of hiatal hernia    Hypertension    OSA on CPAP    PAF (paroxysmal atrial fibrillation) (HCC) 01/04/2021   Prediabetes    Seasonal allergies      HPI  Pt states he has been having abnormal palpitations outside of his typical A fib He reports that he was having left sided chest pain with radiation into his left arm and shoulder but this has now resolved  He states the chest pain did not seem to get worse with exertion but was tied to runs of SVT and tachycardia per his home monitor He does report chest tightness and SOB currently  He states that he took Propanolol around 4:30-5 PM which seemed to help with symptoms and slow down his tachycardia   Pt reports that he typically has LLE edema later in the day but in the AM it is normal and symmetrical to the right  He had virtual visit earlier today and was instructed to go to ED for evaluation and work-up    Past Medical History:  Diagnosis Date   Arthritis    lower back (09/28/2015)   Atypical angina (HCC) dx'd 09/2015   GERD (gastroesophageal reflux disease)    High cholesterol    History of hiatal hernia    Hypertension    OSA on CPAP    PAF (paroxysmal atrial fibrillation) (HCC) 01/04/2021   Prediabetes    Seasonal allergies     Patient Active Problem List   Diagnosis Date Noted   High risk medication use 10/09/2023   Adenocarcinoma of prostate (HCC) 09/12/2021   Allergic rhinitis due to pollen 09/12/2021   Anxiety 09/12/2021   Benign prostatic hyperplasia with lower urinary tract  symptoms 09/12/2021   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   Morbid obesity (HCC) 09/12/2021   Overactive bladder 09/12/2021   Priapism 09/12/2021   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/2021   Type 2 diabetes mellitus with other specified complication (HCC) 09/12/2021   Vitamin D deficiency 09/12/2021   Type 2 diabetes mellitus without complication (HCC) 09/12/2021   Prostate cancer (HCC) 08/30/2021   Malignant neoplasm of prostate (HCC) 06/26/2021   Preop cardiovascular exam 06/20/2021   Paroxysmal atrial fibrillation (HCC) 01/04/2021   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   Tinnitus, bilateral 07/24/2020   Hyperlipidemia due to dietary fat intake 01/13/2020   DOE (dyspnea on exertion) 01/13/2020   Lower extremity edema 01/13/2020   Intolerance, food 11/22/2019   Nonallergic rhinitis 11/22/2019   Pain in left knee 07/30/2019   Abdominal pain, epigastric 03/09/2019   Esophageal reflux 03/09/2019   Nausea 03/09/2019   PAT (paroxysmal atrial tachycardia) (HCC) 11/30/2018   Essential hypertension 09/21/2015   Obstructive sleep apnea 09/21/2015   Abnormal finding on EKG - anterolateral biphasic T waves with ST depression 09/21/2015   Acute medial meniscal tear 04/06/2013    Past Surgical History:  Procedure Laterality Date   ANKLE HARDWARE REMOVAL Left 2006   left    BIOPSY  03/09/2019  Procedure: BIOPSY;  Surgeon: Dianna Specking, MD;  Location: [facility];  Service: Endoscopy;;   CARDIAC CATHETERIZATION N/A 09/28/2015   Procedure: Left Heart Cath and Coronary Angiography;  Surgeon: Deatrice DELENA Cage, MD;  Location: [facility];  Service: Cardiovascular;: For possible atypical angina, mildly elevated troponins.  Angiographically normal coronary arteries   CARDIAC EVENT MONITOR  09/2018   Heart rate range 47 bpm - 190 bpm.  Average heart rate 70 bpm.  Low burden of PACs and PVCs.  3 short runs of SVT/PAT with aberrancy versus V. tach.  25 bursts of PAT 3 episodes of what appear  to be atrial tach versus atrial fib with a rate of 190 bpm    COLONOSCOPY WITH PROPOFOL  N/A 03/09/2019   Procedure: COLONOSCOPY WITH PROPOFOL ;  Surgeon: Dianna Specking, MD;  Location: [facility];  Service: Endoscopy;  Laterality: N/A;   ESOPHAGOGASTRODUODENOSCOPY (EGD) WITH PROPOFOL  N/A 03/09/2019   Procedure: ESOPHAGOGASTRODUODENOSCOPY (EGD) WITH PROPOFOL ;  Surgeon: Dianna Specking, MD;  Location: [facility];  Service: Endoscopy;  Laterality: N/A;   KNEE ARTHROSCOPY  09/20/2011   Procedure: ARTHROSCOPY KNEE;  Surgeon: Dempsey LULLA Moan, MD;  Location: [facility];  Service: Orthopedics;  Laterality: Right;  Medial meniscal DEBRIDEMENT with chondroplasty, right knee   KNEE ARTHROSCOPY Left 04/07/2013   Procedure: LEFT KNEE ARTHROSCOPY WITH MEDIAL MENISCUS  DEBRIDEMENT ;  Surgeon: Dempsey LULLA Moan, MD;  Location: [facility];  Service: Orthopedics;  Laterality: Left;   LIPOMA EXCISION Right 2004   neck   LYMPHADENECTOMY Bilateral 08/30/2021   Procedure: LYMPHADENECTOMY, PELVIC;  Surgeon: Renda Glance, MD;  Location: [facility];  Service: Urology;  Laterality: Bilateral;   NASAL SEPTUM SURGERY  1992   ORIF ANKLE FRACTURE Left 1981   ROBOT ASSISTED LAPAROSCOPIC RADICAL PROSTATECTOMY N/A 08/30/2021   Procedure: XI ROBOTIC ASSISTED LAPAROSCOPIC RADICAL PROSTATECTOMY LEVEL 3;  Surgeon: Renda Glance, MD;  Location: [facility];  Service: Urology;  Laterality: N/A;   TONSILLECTOMY  2008   TRANSTHORACIC ECHOCARDIOGRAM  02/28/2021   EF 60 to 65%.  No R WMA.  Diastolic function, but unable to quantify.  Normal RV.  Normal valves.   UVULOPALATOPHARYNGOPLASTY  2008       Home Medications    Prior to Admission medications   Medication Sig Start Date End Date Taking? Authorizing Provider  propranolol  (INDERAL ) 20 MG tablet TAKE 1/2- 1 TABLET BY MOUTH AS NEEDED FOR PALPITATIONS Patient taking differently: Take 10 mg by mouth as needed. 01/29/22  Yes Anner Alm ORN, MD  amiodarone  (PACERONE )  200 MG tablet Take 1 tablet (200 mg total) by mouth 2 (two) times daily for 30 days, THEN 1 tablet (200 mg total) daily. 01/16/24 02/14/25  Fenton, Clint R, PA  ASHWAGANDHA PO Take 3 tablets by mouth in the morning. WITH MAGNESIUM GLYCINATE-    [provider]  atorvastatin  (LIPITOR ) 40 MG tablet Take 40 mg by mouth every other day.    [provider]  diltiazem  (CARDIZEM  CD) 120 MG 24 hr capsule TAKE 1 CAPSULE(120 MG) BY MOUTH DAILY 02/25/22   Anner Alm ORN, MD  gabapentin  (NEURONTIN ) 300 MG capsule Take 300-600 mg by mouth 2 (two) times daily as needed (pain).    [provider]  magnesium gluconate (MAGONATE) 500 MG tablet Take 500 mg by mouth at bedtime.    [provider]  melatonin 3 MG TABS tablet Take 1 tablet by mouth at bedtime.    [provider]  Multiple Vitamin (MULTIVITAMIN ADULT PO)  Take 1 tablet by mouth every morning.    [provider]  pantoprazole  (PROTONIX ) 20 MG tablet Take 20 mg by mouth daily. 03/07/23   [provider]  rivaroxaban  (XARELTO ) 20 MG TABS tablet Take 1 tablet (20 mg total) by mouth daily with supper. 02/02/24   Fenton, Clint R, PA  sildenafil (VIAGRA) 100 MG tablet Take 100 mg by mouth daily as needed. Patient taking differently: Take 100 mg by mouth as needed. 08/12/22   [provider]  tadalafil (CIALIS) 5 MG tablet Take 5 mg by mouth every evening.    [provider]  valsartan  (DIOVAN ) 320 MG tablet TAKE 1 TABLET(320 MG) BY MOUTH DAILY 01/29/23   Camnitz, Soyla Lunger, MD    Family History Family History  Problem Relation Age of Onset   Hypertension Mother    Dementia Mother     Social History Social History   Tobacco Use   Smoking status: Never   Smokeless tobacco: Former    Types: Chew    Quit date: 09/16/2001   Tobacco comments:    Former Chew 03/21/2021  Vaping Use   Vaping status: Never Used  Substance Use Topics   Alcohol use: Not Currently   Drug use: Not  Currently    Types: Marijuana    Comment: quit in the 1990s     Allergies   Morphine , Carvedilol , Hydrocodone -acetaminophen , Oxycodone , and Trazodone hcl   Review of Systems Review of Systems  Constitutional:  Negative for diaphoresis.  Respiratory:  Positive for chest tightness and shortness of breath.   Cardiovascular:  Positive for chest pain and palpitations.  Gastrointestinal:  Negative for nausea and vomiting.     Physical Exam Triage Vital Signs ED Triage Vitals [03/11/24 1809]  Encounter Vitals Group     BP (!) 144/78     Girls Systolic BP Percentile      Girls Diastolic BP Percentile      Boys Systolic BP Percentile      Boys Diastolic BP Percentile      Pulse Rate (!) 55     Resp 20     Temp 98.4 F (36.9 C)     Temp Source Oral     SpO2 95 %     Weight 276 lb 14.4 oz (125.6 kg)     Height 5' 7 (1.702 m)     Head Circumference      Peak Flow      Pain Score 0     Pain Loc      Pain Education      Exclude from Growth Chart    No data found.  Updated Vital Signs BP (!) 144/78 (BP Location: Right Arm)   Pulse (!) 55   Temp 98.4 F (36.9 C) (Oral)   Resp 20   Ht 5' 7 (1.702 m)   Wt 276 lb 14.4 oz (125.6 kg)   SpO2 95%   BMI 43.37 kg/m   Visual Acuity Right Eye Distance:   Left Eye Distance:   Bilateral Distance:    Right Eye Near:   Left Eye Near:    Bilateral Near:     Physical Exam Vitals reviewed.  Constitutional:      General: He is awake. He is not in acute distress.    Appearance: Normal appearance. He is well-developed and well-groomed. He is not ill-appearing or toxic-appearing.  HENT:     Head: Normocephalic and atraumatic.  Cardiovascular:     Rate and Rhythm: Bradycardia  present.     Pulses:          Radial pulses are 2+ on the right side and 2+ on the left side.     Heart sounds: Heart sounds are distant. No murmur heard.    No friction rub. No gallop.  Pulmonary:     Effort: Pulmonary effort is normal.     Breath  sounds: Normal breath sounds. No decreased air movement. No decreased breath sounds, wheezing, rhonchi or rales.  Musculoskeletal:     Right lower leg: No edema.     Left lower leg: Edema present.  Neurological:     Mental Status: He is alert.  Psychiatric:        Behavior: Behavior is cooperative.      UC Treatments / Results  Labs (all labs ordered are listed, but only abnormal results are displayed) Labs Reviewed - No data to display  EKG   Radiology DG Chest 2 View Result Date: 03/11/2024 CLINICAL DATA:  Chest pain. EXAM: CHEST - 2 VIEW COMPARISON:  Chest radiograph dated 03/25/2020. FINDINGS: No focal consolidation, pleural effusion, or pneumothorax. Stable cardiac silhouette. No acute osseous pathology. IMPRESSION: No active cardiopulmonary disease. Electronically Signed   By: Vanetta Chou M.D.   On: 03/11/2024 20:18    Procedures ED EKG  Date/Time: 03/11/2024 6:37 PM  Performed by: Marylene Rocky BRAVO, PA-C Authorized by: Marylene Rocky BRAVO, PA-C   Previous ECG:    Previous ECG:  Compared to current   Similarity:  Changes noted   Comparison ECG info:  02/02/24 and 01/12/24 Interpretation:    Interpretation: abnormal     Details:  Sinus bradycardia,  inverted T-wave in leads aVL Rate:    ECG rate:  50   ECG rate assessment: bradycardic   Rhythm:    Rhythm: sinus bradycardia   Ectopy:    Ectopy: none   ST segments:    ST segments:  Normal T waves:    T waves: inverted     Inverted:  AVL  (including critical care time)  Medications Ordered in UC Medications - No data to display  Initial Impression / Assessment and Plan / UC Course  I have reviewed the triage vital signs and the nursing notes.  Pertinent labs & imaging results that were available during my care of the patient were reviewed by me and considered in my medical decision making (see chart for details).     Patient declines EMS transportation and states that he feels comfortable driving to nearby med  center for further evaluation Final Clinical Impressions(s) / UC Diagnoses   Final diagnoses:  Chest pain, unspecified type  Atrial fibrillation, currently in sinus rhythm   Pt presents today for concerns of chest pain with radiation into the left arm, SOB, dizziness, and palpitations. He reports he has been having these symptoms intermittently and denies current chest pain. He denies increased pain or provocation with exertion but reports the pain does seem to occur with arrhythmias as noted on his heart monitor. He took Propanolol at about 4:30 which seemed to improve his symptoms and reduce his tachycardia. Vitals are notable for mildly elevated BP and bradycardia. EKG was concerning for sinus bradycardia with inverted t wave in aVL.  Physical exam is notable for distant heart sounds as well as bradycardia and left lower extremity edema.  Reviewed with patient that given his symptoms as well as his previous medical history of paroxysmal A-fib I do recommend evaluation in the emergency room for  extended workup with labs and cardiac monitoring.  Reviewed with patient that even though his EKG does not show obvious signs of STEMI I cannot definitively rule out cardiac event without appropriate workup.  Patient voices agreement and understanding with recommendation.  We discussed transportation via EMS but patient declined stating that he feels comfortable going via private vehicle.    Discharge Instructions      You are seen today for concerns of chest pain that is radiating into your left arm along with chest tightness and shortness of breath.  Your EKG here is largely reassuring but I cannot definitively say that you have not sustained a cardiac injury without further testing which is only completed in the emergency room.  I recommend that you go to the nearest ED for further evaluation and definitive rule out for your symptoms.  You have elected to go via private vehicle.  If you feel like you are  unable to make it to the emergency room on your own please call 911 for assistance     ED Prescriptions   None    PDMP not reviewed this encounter.   Yoshino Broccoli E, PA-C 03/11/24 2110

## 2024-03-11 NOTE — Progress Notes (Signed)
 PHARMACY - ANTICOAGULATION CONSULT NOTE  Pharmacy Consult for heparin  Indication: chest pain/ACS, atrial fibrillation  Allergies  Allergen Reactions   Morphine  Nausea And Vomiting   Carvedilol  Other (See Comments)    Makes patient feel like his moving in slow motion   Hydrocodone -Acetaminophen      Other reaction(s): feels like a hang over   Oxycodone  Nausea Only   Trazodone Hcl     Other reaction(s): priapism    Patient Measurements: Height: 5' 7 (170.2 cm) Weight: 125.6 kg (276 lb 14.4 oz) IBW/kg (Calculated) : 66.1 HEPARIN  DW (KG): 95.5  Vital Signs: Temp: 98.4 F (36.9 C) (08/28 1957) Temp Source: Oral (08/28 1957) BP: 143/92 (08/28 2245) Pulse Rate: 53 (08/28 2245)  Labs: Recent Labs    03/11/24 2008  HGB 13.7  HCT 42.6  PLT 228  CREATININE 1.24    Estimated Creatinine Clearance: 79.5 mL/min (by C-G formula based on SCr of 1.24 mg/dL).   Medical History: Past Medical History:  Diagnosis Date   Arthritis    lower back (09/28/2015)   Atypical angina (HCC) dx'd 09/2015   GERD (gastroesophageal reflux disease)    High cholesterol    History of hiatal hernia    Hypertension    OSA on CPAP    PAF (paroxysmal atrial fibrillation) (HCC) 01/04/2021   Prediabetes    Seasonal allergies    Assessment: 35 yoM presented to ED with chest pain. Pharmacy consulted to dose heparin  for ACS. PMH includes atrial fibrillation (PTA Xarelto ).  -CBC stable -Trops 25 -Last dose of Xarelto  8/27 PM   Goal of Therapy:  Heparin  level 0.3-0.7 units/ml aPTT 66-102 seconds Monitor platelets by anticoagulation protocol: Yes   Plan:  No bolus given recent Xarelto  use Start heparin  infusion at 1400 units/hr given last dose of Xarelto  8/27 Check aPTT and anti-Xa level in 6 hours and daily while on heparin  Monitor with aPTTs until correlates with heparin  level Continue to monitor H&H and platelets Follow plans to txr to [facility]   Lynwood Poplar, PharmD, BCPS Clinical  Pharmacist 03/11/2024 11:46 PM

## 2024-03-11 NOTE — ED Triage Notes (Signed)
 Pt presents with complaints of left-sided chest pain that is radiating to his left shoulder. Pain began today at approximately 11:30 AM. The pain will come and go. Currently denies having pain. Chest pains is accompanied with SOB, dizziness, and lightheadedness. Propanolol taken today at 3:30 PM with improvement. Hx of afib. Ablation scheduled for next month on 9/26. Has heart monitor on, only pain when the arrhythmias come.

## 2024-03-11 NOTE — ED Triage Notes (Signed)
 Chest pain down left arm From noon to 5pm Relieved with dose of propanolol Hx afib scheduled for ablation

## 2024-03-11 NOTE — Discharge Instructions (Addendum)
 You are seen today for concerns of chest pain that is radiating into your left arm along with chest tightness and shortness of breath.  Your EKG here is largely reassuring but I cannot definitively say that you have not sustained a cardiac injury without further testing which is only completed in the emergency room.  I recommend that you go to the nearest ED for further evaluation and definitive rule out for your symptoms.  You have elected to go via private vehicle.  If you feel like you are unable to make it to the emergency room on your own please call 911 for assistance

## 2024-03-11 NOTE — ED Notes (Addendum)
 This RN checked on patient at this time. Reports no pain. All needs met at this time.

## 2024-03-11 NOTE — ED Notes (Signed)
 Erin Mecum PA currently in patient's room.

## 2024-03-11 NOTE — ED Provider Notes (Signed)
 Patient signed out to myself at shift change.  Patient notes that he developed chest pain, shortness of breath and palpitations earlier today.  He notes he did take a dose of propranolol  which did improve his symptoms.  Patient notes that pain has resolved at this point and he does feel much better.  At signout patient is pending his second troponin as the initial 1 was elevated. Physical Exam  BP (!) 143/92   Pulse (!) 53   Temp 98.4 F (36.9 C) (Oral)   Resp 18   Ht 5' 7 (1.702 m)   Wt 125.6 kg   SpO2 96%   BMI 43.37 kg/m   Physical Exam  Procedures  Procedures  ED Course / MDM    Medical Decision Making Amount and/or Complexity of Data Reviewed Labs: ordered. Radiology: ordered.  Risk OTC drugs. Decision regarding hospitalization.   Patient does remain stable at this time and does remain pain-free.  Patient does note that he has been off of his [facility]arel[facility]  [facility].  Did discuss patient case with the on-call cardiologist who did agree with admission at this point and orders have been placed.  Patient has been given a dose of aspirin  and has been started on heparin  drip as well.  Blood work has otherwise been unremarkable and chest x-ray demonstrated no acute cardiopulmonary process.       Daralene Lonni BIRCH, PA-C 03/11/24 2355    Armenta Canning, MD 03/12/24 [phone]

## 2024-03-11 NOTE — ED Notes (Signed)
Pt refused EMS transport

## 2024-03-12 ENCOUNTER — Inpatient Hospital Stay (HOSPITAL_COMMUNITY)

## 2024-03-12 ENCOUNTER — Other Ambulatory Visit (HOSPITAL_COMMUNITY): Payer: Self-pay

## 2024-03-12 ENCOUNTER — Telehealth: Payer: Self-pay | Admitting: Cardiology

## 2024-03-12 DIAGNOSIS — Z818 Family history of other mental and behavioral disorders: Secondary | ICD-10-CM | POA: Diagnosis not present

## 2024-03-12 DIAGNOSIS — I10 Essential (primary) hypertension: Secondary | ICD-10-CM | POA: Diagnosis present

## 2024-03-12 DIAGNOSIS — R7989 Other specified abnormal findings of blood chemistry: Secondary | ICD-10-CM | POA: Diagnosis present

## 2024-03-12 DIAGNOSIS — I214 Non-ST elevation (NSTEMI) myocardial infarction: Secondary | ICD-10-CM

## 2024-03-12 DIAGNOSIS — E119 Type 2 diabetes mellitus without complications: Secondary | ICD-10-CM | POA: Diagnosis present

## 2024-03-12 DIAGNOSIS — Z79899 Other long term (current) drug therapy: Secondary | ICD-10-CM | POA: Diagnosis not present

## 2024-03-12 DIAGNOSIS — Z7901 Long term (current) use of anticoagulants: Secondary | ICD-10-CM | POA: Diagnosis not present

## 2024-03-12 DIAGNOSIS — I2489 Other forms of acute ischemic heart disease: Secondary | ICD-10-CM | POA: Diagnosis present

## 2024-03-12 DIAGNOSIS — Z8249 Family history of ischemic heart disease and other diseases of the circulatory system: Secondary | ICD-10-CM | POA: Diagnosis not present

## 2024-03-12 DIAGNOSIS — Z888 Allergy status to other drugs, medicaments and biological substances status: Secondary | ICD-10-CM | POA: Diagnosis not present

## 2024-03-12 DIAGNOSIS — E8881 Metabolic syndrome: Secondary | ICD-10-CM | POA: Diagnosis present

## 2024-03-12 DIAGNOSIS — Z87891 Personal history of nicotine dependence: Secondary | ICD-10-CM | POA: Diagnosis not present

## 2024-03-12 DIAGNOSIS — R0789 Other chest pain: Secondary | ICD-10-CM | POA: Diagnosis present

## 2024-03-12 DIAGNOSIS — G4733 Obstructive sleep apnea (adult) (pediatric): Secondary | ICD-10-CM | POA: Diagnosis present

## 2024-03-12 DIAGNOSIS — Z885 Allergy status to narcotic agent status: Secondary | ICD-10-CM | POA: Diagnosis not present

## 2024-03-12 DIAGNOSIS — I480 Paroxysmal atrial fibrillation: Secondary | ICD-10-CM | POA: Diagnosis present

## 2024-03-12 DIAGNOSIS — Z6841 Body Mass Index (BMI) 40.0 and over, adult: Secondary | ICD-10-CM | POA: Diagnosis not present

## 2024-03-12 DIAGNOSIS — K219 Gastro-esophageal reflux disease without esophagitis: Secondary | ICD-10-CM | POA: Diagnosis present

## 2024-03-12 DIAGNOSIS — E7800 Pure hypercholesterolemia, unspecified: Secondary | ICD-10-CM | POA: Diagnosis present

## 2024-03-12 LAB — ECHOCARDIOGRAM COMPLETE
Area-P 1/2: 2.87 cm2
Height: 67 in
S' Lateral: 3.2 cm
Single Plane A4C EF: 50.1 %
Weight: 4337.6 [oz_av]

## 2024-03-12 LAB — CBC
HCT: 41.9 % (ref 39.0–52.0)
Hemoglobin: 13.6 g/dL (ref 13.0–17.0)
MCH: 28.9 pg (ref 26.0–34.0)
MCHC: 32.5 g/dL (ref 30.0–36.0)
MCV: 89.1 fL (ref 80.0–100.0)
Platelets: 230 K/uL (ref 150–400)
RBC: 4.7 MIL/uL (ref 4.22–5.81)
RDW: 13 % (ref 11.5–15.5)
WBC: 6 K/uL (ref 4.0–10.5)
nRBC: 0 % (ref 0.0–0.2)

## 2024-03-12 LAB — BASIC METABOLIC PANEL WITH GFR
Anion gap: 13 (ref 5–15)
BUN: 12 mg/dL (ref 8–23)
CO2: 23 mmol/L (ref 22–32)
Calcium: 8.6 mg/dL — ABNORMAL LOW (ref 8.9–10.3)
Chloride: 103 mmol/L (ref 98–111)
Creatinine, Ser: 1.07 mg/dL (ref 0.61–1.24)
GFR, Estimated: >60 mL/min (ref >60)
Glucose, Bld: 100 mg/dL — ABNORMAL HIGH (ref 70–99)
Potassium: 3.2 mmol/L — ABNORMAL LOW (ref 3.5–5.1)
Sodium: 139 mmol/L (ref 135–145)

## 2024-03-12 LAB — LIPID PANEL
Cholesterol: 120 mg/dL (ref 0–200)
HDL: 38 mg/dL — ABNORMAL LOW (ref >40)
LDL Cholesterol: 69 mg/dL (ref 0–99)
Total CHOL/HDL Ratio: 3.2 ratio
Triglycerides: 67 mg/dL (ref <150)
VLDL: 13 mg/dL (ref 0–40)

## 2024-03-12 LAB — MAGNESIUM: Magnesium: 2 mg/dL (ref 1.7–2.4)

## 2024-03-12 LAB — APTT: aPTT: 63 s — ABNORMAL HIGH (ref 24–36)

## 2024-03-12 LAB — HEPARIN LEVEL (UNFRACTIONATED): Heparin Unfractionated: 0.69 [IU]/mL (ref 0.30–0.70)

## 2024-03-12 MED ORDER — ASPIRIN 81 MG PO CHEW
81.0000 mg | CHEWABLE_TABLET | Freq: Every day | ORAL | Status: DC
  Filled 2024-03-12: qty 1

## 2024-03-12 MED ORDER — IOHEXOL 350 MG/ML SOLN
95.0000 mL | Freq: Once | INTRAVENOUS | Status: AC | PRN
  Administered 2024-03-12: 95 mL via INTRAVENOUS

## 2024-03-12 MED ORDER — ASPIRIN 81 MG PO TBEC
81.0000 mg | DELAYED_RELEASE_TABLET | Freq: Every day | ORAL | Status: DC
  Administered 2024-03-12: 81 mg via ORAL
  Filled 2024-03-12: qty 1

## 2024-03-12 MED ORDER — GABAPENTIN 300 MG PO CAPS: 300.0000 mg | ORAL_CAPSULE | Freq: Two times a day (BID) | ORAL | Status: DC | PRN

## 2024-03-12 MED ORDER — EMPAGLIFLOZIN 25 MG PO TABS
ORAL_TABLET | ORAL | 11 refills | Status: DC
  Filled 2024-03-12: qty 30, fill #0

## 2024-03-12 MED ORDER — EMPAGLIFLOZIN 10 MG PO TABS
10.0000 mg | ORAL_TABLET | Freq: Every day | ORAL | 0 refills | Status: DC
  Filled 2024-03-12: qty 30, 30d supply, fill #0

## 2024-03-12 MED ORDER — ONDANSETRON HCL 4 MG/2ML IJ SOLN: 4.0000 mg | Freq: Four times a day (QID) | INTRAMUSCULAR | Status: DC | PRN

## 2024-03-12 MED ORDER — ACETAMINOPHEN 325 MG PO TABS
650.0000 mg | ORAL_TABLET | ORAL | Status: DC | PRN
Start: 2024-03-12 — End: 2024-03-12

## 2024-03-12 MED ORDER — AMIODARONE HCL 200 MG PO TABS
200.0000 mg | ORAL_TABLET | Freq: Every day | ORAL | Status: DC
  Administered 2024-03-12: 200 mg via ORAL
  Filled 2024-03-12: qty 1

## 2024-03-12 MED ORDER — RIVAROXABAN 20 MG PO TABS: 20.0000 mg | ORAL_TABLET | Freq: Every day | ORAL | Status: DC

## 2024-03-12 MED ORDER — DILTIAZEM HCL ER COATED BEADS 120 MG PO CP24
120.0000 mg | ORAL_CAPSULE | Freq: Every day | ORAL | Status: DC
  Administered 2024-03-12: 120 mg via ORAL
  Filled 2024-03-12: qty 1

## 2024-03-12 MED ORDER — NITROGLYCERIN 0.4 MG SL SUBL
0.8000 mg | SUBLINGUAL_TABLET | Freq: Once | SUBLINGUAL | Status: AC
  Administered 2024-03-12: 0.8 mg via SUBLINGUAL

## 2024-03-12 MED ORDER — LOSARTAN POTASSIUM 25 MG PO TABS
25.0000 mg | ORAL_TABLET | Freq: Every day | ORAL | 11 refills | Status: DC
  Filled 2024-03-12: qty 30, 30d supply, fill #0

## 2024-03-12 MED ORDER — PANTOPRAZOLE SODIUM 20 MG PO TBEC
20.0000 mg | DELAYED_RELEASE_TABLET | Freq: Every day | ORAL | Status: DC
  Administered 2024-03-12: 20 mg via ORAL
  Filled 2024-03-12: qty 1

## 2024-03-12 MED ORDER — POTASSIUM CHLORIDE CRYS ER 20 MEQ PO TBCR
40.0000 meq | EXTENDED_RELEASE_TABLET | ORAL | Status: DC
  Administered 2024-03-12: 40 meq via ORAL
  Filled 2024-03-12: qty 2

## 2024-03-12 MED ORDER — ATORVASTATIN CALCIUM 40 MG PO TABS
40.0000 mg | ORAL_TABLET | ORAL | Status: DC
  Administered 2024-03-12: 40 mg via ORAL
  Filled 2024-03-12: qty 1

## 2024-03-12 MED ORDER — NITROGLYCERIN 0.4 MG SL SUBL
SUBLINGUAL_TABLET | SUBLINGUAL | Status: AC
  Filled 2024-03-12: qty 2

## 2024-03-12 NOTE — Progress Notes (Signed)
 Pt verbally understands D/C instructions.  PIV Tele removed.  CCMD/Dwayne.   Wife at bedside.    VSS.  TOC Jonita A .

## 2024-03-12 NOTE — Discharge Summary (Addendum)
 Discharge Summary   Patient ID: Joshua Stanton MRN: [id number]; DOB: 11-12-1960  Admit date: 03/11/2024 Discharge date: 03/12/2024  PCP:  Clinic, [facility] Providers Cardiologist:  Alm Clay, MD  Electrophysiologist:  Will Gladis Norton, MD    Discharge Diagnoses  Principal Problem:   NSTEMI (non-ST elevated myocardial infarction) [facility]) Active Problems:   Essential hypertension   Obstructive sleep apnea   Hyperlipidemia due to dietary fat intake   Paroxysmal atrial fibrillation (HCC)   Type 2 diabetes mellitus without complication (HCC)  Diagnostic Studies/Procedures   Coronary CTA, 03/12/2024 Coronary calcium  score of 0. This was 0 percentile for age-, sex, and race-matched controls. Plaque data not available. Normal coronary origin with left dominance. Normal coronary arteries  Echocardiogram, 03/12/2024 Left ventricular ejection fraction, by estimation, is 50 to 55% . Left ventricular ejection fraction by PLAX is 68 % . The left ventricle has low normal function. The left ventricle demonstrates global hypokinesis. There is moderate concentric left ventricular hypertrophy. Left ventricular diastolic parameters are consistent with Grade I diastolic dysfunction ( impaired relaxation) .  Right ventricular systolic function is normal. The right ventricular size is normal. There is normal pulmonary artery systolic pressure. The estimated right ventricular systolic pressure is 17. 4 mmHg.  The mitral valve is grossly normal. Trivial mitral valve regurgitation.  The aortic valve is tricuspid. Aortic valve regurgitation is not visualized. No aortic stenosis is present.  Aortic dilatation noted. There is borderline dilatation of the ascending aorta, measuring 38 mm.  The inferior vena cava is normal in size with greater than 50% respiratory variability, suggesting right atrial pressure of 3 mmHg. _____________   History of Present Illness    Joshua Stanton is a 63 y.o. male with severe obesity, HTN, OSA, insulin resistance and paroxysmal A.Fib. he presented to [facility] ED on 03/11/2024 with chest pain.   Hospital Course   He was admitted to the cardiology service to undergo further evaluation and workup regarding his chest pain with concern for angina. He presented in A. Fib with RVR and ended up converted to sinus with propranolol . The conversion back to sinus rhythm led to resolution of his chest pain. Troponins were minimally elevated at 31 ? 25. With this presentation, the decision was made to proceed with coronary CTA and echocardiogram.   The results as are above. With them being reassuring, he was deemed medically stable per discharge with plans to remain on Xarelto  and PVI planned next month.   Treatment plan at discharge is as follows:  Diastolic dysfinction, grade 1 & LVH  Demand ischemia in setting of A. Fib RVR  Start SGLT2i at discharge, with [facility] insurance will order 12.5 mg for best coverage per pharmacy  Restart home Diovan  320 mg  Paroxysmal atrial fibrillation with episodes RVR Continue PO amiodarone  200 mg daily Continue PO diltiazem  120 mg daily Continue PRN PO propranolol  25 mg  Continue Xarelto  20 mg daily  Scheduled for ablation 9/26  Hypertension Continue PO diltiazem  120 mg daily  Hyperlipidemia, goal LDL < 70 03/12/2024: HDL 38; LDL Cholesterol 69  Continue Lipitor  40 mg daily  Did the patient have an acute coronary syndrome (MI, NSTEMI, STEMI, etc) this admission?:  No.   The elevated Troponin was due to the acute medical illness (demand ischemia).  _____________  Discharge Vitals Blood pressure 131/68, pulse (!) 53, temperature (!) 97.4 F (36.3 C), temperature source Oral, resp. rate 16, height 5' 7 (1.702 m), weight 123  kg, SpO2 98%.  Filed Weights   03/11/24 2300 03/12/24 0255  Weight: 125.6 kg 123 kg   Labs & Radiologic Studies  CBC Recent Labs    03/11/24 2008  03/12/24 0815  WBC 7.7 6.0  HGB 13.7 13.6  HCT 42.6 41.9  MCV 90.4 89.1  PLT 228 230   Basic Metabolic Panel Recent Labs    91/71/74 2008 03/12/24 0815  NA 142 139  K 3.9 3.2*  CL 104 103  CO2 27 23  GLUCOSE 92 100*  BUN 14 12  CREATININE 1.24 1.07  CALCIUM  9.4 8.6*  MG  --  2.0   Liver Function Tests No results for input(s): AST, ALT, ALKPHOS, BILITOT, PROT, ALBUMIN in the last 72 hours. No results for input(s): LIPASE, AMYLASE in the last 72 hours. High Sensitivity Troponin:   No results for input(s): TROPONINIHS in the last 720 hours.  Recent Labs  Lab 03/11/24 2008 03/11/24 2156  TRNPT 31* 25*    BNP Invalid input(s): POCBNP No results for input(s): PROBNP in the last 72 hours.  No results for input(s): BNP in the last 72 hours.  D-Dimer No results for input(s): DDIMER in the last 72 hours. Hemoglobin A1C No results for input(s): HGBA1C in the last 72 hours. Fasting Lipid Panel Recent Labs    03/12/24 0815  CHOL 120  HDL 38*  LDLCALC 69  TRIG 67  CHOLHDL 3.2   No results found for: LIPOA  Thyroid Function Tests No results for input(s): TSH, T4TOTAL, T3FREE, THYROIDAB in the last 72 hours.  Invalid input(s): FREET3 _____________  CT CORONARY MORPH W/CTA COR W/SCORE W/CA W/CM &/OR WO/CM Result Date: 03/12/2024 CLINICAL DATA:  Chest pain EXAM: Cardiac/Coronary CTA TECHNIQUE: A non-contrast, gated CT scan was obtained with axial slices of 3 mm through the heart for calcium  scoring. Calcium  scoring was performed using the Agatston method. A 120 kV prospective, gated, contrast cardiac scan was obtained. Gantry rotation speed was 250 msecs and collimation was 0.6 mm. Two sublingual nitroglycerin  tablets (0.8 mg) were given. The 3D data set was reconstructed in 5% intervals of the 35-75% of the R-R cycle. Diastolic phases were analyzed on a dedicated workstation using MPR, MIP, and VRT modes. The patient received 95 cc of  contrast. FINDINGS: Image quality: Good Noise artifact is: Moderate Coronary Arteries:  Normal coronary origin.  Left dominance. Left main: The left main is a large caliber vessel with a normal take off from the left coronary cusp that bifurcates to form a left anterior descending artery and a left circumflex artery. There is no plaque or stenosis. Left anterior descending artery: The LAD is patent without evidence of plaque or stenosis. The LAD gives off 2 patent diagonal branches. Left circumflex artery: The LCX is dominant and patent with no evidence of plaque or stenosis. The LCX gives off 2 patent obtuse marginal branches as well as an LPLA and LPDA branches with no stenosis. Right coronary artery: The RCA is non dominant with normal take off from the right coronary cusp. There is no evidence of plaque or stenosis. Right Atrium: Right atrial size is within normal limits. Right Ventricle: The right ventricular cavity is within normal limits. Left Atrium: Left atrial size is normal in size with no left atrial appendage filling defect. Left Ventricle: The ventricular cavity size is within normal limits. Pulmonary arteries: Normal in size. Pulmonary veins: Normal pulmonary venous drainage. Pericardium: Normal thickness without significant effusion or calcium  present. Cardiac valves: The aortic valve  is trileaflet without significant calcification. The mitral valve is normal without significant calcification. Aorta: Normal caliber without significant disease. Extra-cardiac findings: See attached radiology report for non-cardiac structures. CLINICAL DATA:  Atrial fibrillation scheduled for ablation. EXAM: Cardiac CTA TECHNIQUE: A non-contrast, gated CT scan was obtained with axial slices of 3 mm through the heart for calcium  scoring. Calcium  scoring was performed using the Agatston method. A 120 kV retrospective, gated, contrast cardiac scan was obtained. Gantry rotation speed was 250 msecs and collimation was 0.6  mm. Nitroglycerin  was not given. A delayed scan was obtained to exclude left atrial appendage thrombus. The 3D dataset was reconstructed in 5% intervals of the 0-95% of the R-R cycle. Late systolic phases were analyzed on a dedicated workstation using MPR, MIP, and VRT modes. The patient received 80 cc of contrast. FINDINGS: Image quality: Average.  Slab artifact. Noise artifact is: Limited. Pulmonary Veins: There is normal pulmonary vein drainage into the left atrium (2 on the right and 2 on the left) with ostial measurements as follows: RUPV:  Ostium 26.76mm x 16.44mm  area 3.30cm2 RLPV:  Ostium 19.7mm x 14.54mm  area 2.13cm2 LUPV:  Ostium  22.52mm x 13mm area 2.20cm2 LLPV:  Ostium 14.51mm x 8.41mm  Area 0.99cm2 Left Atrium: The left atrial size is normal. There is no PFO/ASD. The left atrial appendage is large broccoli type with two lobes and ostial size 19.5 mm and length 40.3 mm. There is no thrombus in the left atrial appendage on contrast or delayed imaging. The esophagus runs to the left of the atrial midline and is in proximity to the Left pulmonary vein ostia. Coronary Arteries: CAC score of 0, which is 0 percentile for age-, race-, and sex-matched controls. Normal coronary origin. Left dominance. The study was performed without use of NTG and is insufficient for plaque evaluation. Right Atrium: Right atrial size is within normal limits. Right Ventricle: The right ventricular cavity is within normal limits. Left Ventricle: The ventricular cavity size is within normal limits. There are no stigmata of prior infarction. There is no abnormal filling defect. Pericardium: Normal thickness with no significant effusion or calcium  present. Pulmonary Artery: Normal caliber without proximal filling defect. Cardiac valves: The aortic valve is trileaflet without significant calcification. The mitral valve is normal structure without significant calcification. Aorta: Normal caliber with no significant disease. Extra-cardiac  findings: See attached radiology report for non-cardiac structures. IMPRESSION: 1. There is normal pulmonary vein drainage into the left atrium with ostial measurements above. 2. There is no thrombus in the left atrial appendage. 3. The esophagus runs to the left of the atrial midline and is in proximity to the Left pulmonary vein ostia. 4. No PFO/ASD. 5. Normal coronary origin. Left dominance. 6. CAC score of 0 which is 0 percentile for age-, race-, and sex-matched controls. Wilbert Bihari, MD 1. Coronary calcium  score of 0. This was 0 percentile for age-, sex, and race-matched controls. 2. Plaque data not available. 3. Normal coronary origin with left dominance. 4. Normal coronary arteries. RECOMMENDATIONS: 1. CAD-RADS 0: No evidence of CAD (0%). Consider non-atherosclerotic causes of chest pain. 2. CAD-RADS 1: Minimal non-obstructive CAD (0-24%). Consider non-atherosclerotic causes of chest pain. Consider preventive therapy and risk factor modification. 3. CAD-RADS 2: Mild non-obstructive CAD (25-49%). Consider non-atherosclerotic causes of chest pain. Consider preventive therapy and risk factor modification. 4. CAD-RADS 3: Moderate stenosis. Consider symptom-guided anti-ischemic pharmacotherapy as well as risk factor modification per guideline directed care. Additional analysis with CT FFR will be submitted. 5.  CAD-RADS 4: Severe stenosis. (70-99% or > 50% left main). Cardiac catheterization or CT FFR is recommended. Consider symptom-guided anti-ischemic pharmacotherapy as well as risk factor modification per guideline directed care. Invasive coronary angiography recommended with revascularization per published guideline statements. 6. CAD-RADS 5: Total coronary occlusion (100%). Consider cardiac catheterization or viability assessment. Consider symptom-guided anti-ischemic pharmacotherapy as well as risk factor modification per guideline directed care. 7. CAD-RADS N: Non-diagnostic study. Obstructive CAD can't be  excluded. Alternative evaluation is recommended. Wilbert Bihari, MD Electronically Signed   By: Wilbert Bihari M.D.   On: 03/12/2024 15:08   ECHOCARDIOGRAM COMPLETE Result Date: 03/12/2024    ECHOCARDIOGRAM REPORT   Patient Name:   Joshua Stanton Date of Exam: 03/12/2024 Medical Rec #:  [id number]            Height:       67.0 in Accession #:    [id number]           Weight:       271.1 lb Date of Birth:  1961/06/29           BSA:          2.301 m Patient Age:    62 years             BP:           138/77 mmHg Patient Gender: M                    HR:           56 bpm. Exam Location:  Inpatient Procedure: 2D Echo (Both Spectral and Color Flow Doppler were utilized during            procedure). Indications:    NSTEMI  History:        Patient has prior history of Echocardiogram examinations, most                 recent 02/28/2021. Signs/Symptoms:Edema; Risk Factors:Sleep                 Apnea, Dyslipidemia, Hypertension and Diabetes.  Sonographer:    Tinnie Barefoot RDCS Referring Phys: [id number] OZELL BUSHMAN  Sonographer Comments: Patient is obese. Image acquisition challenging due to patient body habitus. IMPRESSIONS  1. Left ventricular ejection fraction, by estimation, is 50 to 55%. Left ventricular ejection fraction by PLAX is 68 %. The left ventricle has low normal function. The left ventricle demonstrates global hypokinesis. There is moderate concentric left ventricular hypertrophy. Left ventricular diastolic parameters are consistent with Grade I diastolic dysfunction (impaired relaxation).  2. Right ventricular systolic function is normal. The right ventricular size is normal. There is normal pulmonary artery systolic pressure. The estimated right ventricular systolic pressure is 17.4 mmHg.  3. The mitral valve is grossly normal. Trivial mitral valve regurgitation.  4. The aortic valve is tricuspid. Aortic valve regurgitation is not visualized. No aortic stenosis is present.  5. Aortic dilatation noted.  There is borderline dilatation of the ascending aorta, measuring 38 mm.  6. The inferior vena cava is normal in size with greater than 50% respiratory variability, suggesting right atrial pressure of 3 mmHg. Comparison(s): Changes from prior study are noted. 02/28/2021: LVEF 60-65%. FINDINGS  Left Ventricle: Left ventricular ejection fraction, by estimation, is 50 to 55%. Left ventricular ejection fraction by PLAX is 68 %. The left ventricle has low normal function. The left ventricle demonstrates global hypokinesis. The left ventricular internal cavity size was normal in size. There  is moderate concentric left ventricular hypertrophy. Left ventricular diastolic parameters are consistent with Grade I diastolic dysfunction (impaired relaxation). Indeterminate filling pressures. Right Ventricle: The right ventricular size is normal. No increase in right ventricular wall thickness. Right ventricular systolic function is normal. There is normal pulmonary artery systolic pressure. The tricuspid regurgitant velocity is 1.90 m/s, and  with an assumed right atrial pressure of 3 mmHg, the estimated right ventricular systolic pressure is 17.4 mmHg. Left Atrium: Left atrial size was normal in size. Right Atrium: Right atrial size was normal in size. Pericardium: There is no evidence of pericardial effusion. Mitral Valve: The mitral valve is grossly normal. Trivial mitral valve regurgitation. Tricuspid Valve: The tricuspid valve is grossly normal. Tricuspid valve regurgitation is trivial. Aortic Valve: The aortic valve is tricuspid. Aortic valve regurgitation is not visualized. No aortic stenosis is present. Pulmonic Valve: The pulmonic valve was grossly normal. Pulmonic valve regurgitation is trivial. Aorta: Aortic dilatation noted. There is borderline dilatation of the ascending aorta, measuring 38 mm. Venous: The inferior vena cava is normal in size with greater than 50% respiratory variability, suggesting right atrial  pressure of 3 mmHg. IAS/Shunts: No atrial level shunt detected by color flow Doppler.  LEFT VENTRICLE PLAX 2D LV EF:         Left            Diastology                ventricular     LV e' medial:    7.83 cm/s                ejection        LV E/e' medial:  8.8                fraction by     LV e' lateral:   8.05 cm/s                PLAX is 68      LV E/e' lateral: 8.5                %. LVIDd:         5.20 cm LVIDs:         3.20 cm LV PW:         1.30 cm LV IVS:        1.50 cm LVOT diam:     2.20 cm LV SV:         82 LV SV Index:   36 LVOT Area:     3.80 cm  LV Volumes (MOD) LV vol d, MOD    109.0 ml A4C: LV vol s, MOD    54.4 ml A4C: LV SV MOD A4C:   109.0 ml RIGHT VENTRICLE             IVC RV Basal diam:  3.30 cm     IVC diam: 1.80 cm RV S prime:     13.30 cm/s TAPSE (M-mode): 2.4 cm LEFT ATRIUM           Index        RIGHT ATRIUM           Index LA diam:      4.00 cm 1.74 cm/m   RA Area:     21.60 cm LA Vol (A4C): 71.7 ml 31.16 ml/m  RA Volume:   65.90 ml  28.64 ml/m  AORTIC VALVE LVOT Vmax:   92.50 cm/s LVOT Vmean:  57.700 cm/s LVOT  VTI:    0.215 m  AORTA Ao Root diam: 3.20 cm Ao Asc diam:  3.80 cm MITRAL VALVE               TRICUSPID VALVE MV Area (PHT): 2.87 cm    TR Peak grad:   14.4 mmHg MV Decel Time: 264 msec    TR Vmax:        190.00 cm/s MV E velocity: 68.60 cm/s MV A velocity: 80.20 cm/s  SHUNTS MV E/A ratio:  0.86        Systemic VTI:  0.22 m                            Systemic Diam: 2.20 cm Vinie Maxcy MD Electronically signed by Vinie Maxcy MD Signature Date/Time: 03/12/2024/2:06:17 PM    Final    DG Chest 2 View Result Date: 03/11/2024 CLINICAL DATA:  Chest pain. EXAM: CHEST - 2 VIEW COMPARISON:  Chest radiograph dated 03/25/2020. FINDINGS: No focal consolidation, pleural effusion, or pneumothorax. Stable cardiac silhouette. No acute osseous pathology. IMPRESSION: No active cardiopulmonary disease. Electronically Signed   By: Vanetta Chou M.D.   On: 03/11/2024 20:18    Disposition Pt is being discharged home today in good condition per MD.  Follow-up Plans & Appointments  Discharge Instructions     Discharge instructions   Complete by: As directed    Starting you on two new medications at discharge. Empagliflozin  (Jardiance )  and losartan  (Cozaar )      Future Appointments  Date Time Provider Department Center  03/19/2024  9:00 AM [facility] CT 1 [facility]V  03/29/2024 10:30 AM Fenton, Clint R, PA [facility] H[facility]V   Discharge Medications Allergies as of 03/12/2024       Reactions   Ms Contin  [morphine ] Nausea And Vomiting   Coreg  [carvedilol ] Other (See Comments)   Makes patient feel like his moving in slow motion   Lortab [hydrocodone -acetaminophen ] Other (See Comments)   General malaise    Roxicodone  [oxycodone ] Nausea Only   Trazodone Hcl Other (See Comments)   Priapism         Medication List     TAKE these medications    acetaminophen  500 MG tablet Commonly known as: TYLENOL  Take 1,000 mg by mouth 2 (two) times daily as needed for headache, moderate pain (pain score 4-6) or fever.   amiodarone  200 MG tablet Commonly known as: PACERONE  Take 1 tablet (200 mg total) by mouth 2 (two) times daily for 30 days, THEN 1 tablet (200 mg total) daily. Start taking on: January 16, 2024 What changed: See the new instructions.   ASHWAGANDHA PO Take 1 tablet by mouth at bedtime.   atorvastatin  40 MG tablet Commonly known as: LIPITOR  Take 40 mg by mouth every evening.   diltiazem  120 MG 24 hr capsule Commonly known as: CARDIZEM  CD TAKE 1 CAPSULE(120 MG) BY MOUTH DAILY   empagliflozin  10 MG Tabs tablet Commonly known as: Jardiance  Take 1 tablet (10 mg total) by mouth daily.   gabapentin  300 MG capsule Commonly known as: NEURONTIN  Take 300-600 mg by mouth 2 (two) times daily as needed (neuropathy).   losartan  25 MG tablet Commonly known as: Cozaar  Take 1 tablet (25 mg total) by mouth daily.   MAGNESIUM GLYCINATE PO Take 1 tablet by mouth  every evening.   melatonin 3 MG Tabs tablet Take 1 tablet by mouth at bedtime.   pantoprazole  20 MG tablet Commonly known as: PROTONIX   Take 20 mg by mouth daily.   propranolol  20 MG tablet Commonly known as: INDERAL  TAKE 1/2- 1 TABLET BY MOUTH AS NEEDED FOR PALPITATIONS What changed: See the new instructions.   rivaroxaban  20 MG Tabs tablet Commonly known as: XARELTO  Take 1 tablet (20 mg total) by mouth daily with supper.   tadalafil 20 MG tablet Commonly known as: CIALIS Take 20 mg by mouth daily as needed (ED).   valsartan  320 MG tablet Commonly known as: DIOVAN  TAKE 1 TABLET(320 MG) BY MOUTH DAILY      Outstanding Labs/Studies N/A  Duration of Discharge Encounter: APP Time: 20 minutes   Signed, Waddell DELENA Donath, PA-C 03/12/2024, 3:12 PM   ATTENDING ATTESTATION:  After conducting a review of all available clinical information with the care team, interviewing the patient, and performing a physical exam, I agree with the findings and plan described in this note.   GEN: No acute distress, AO x 3 HEENT:  MMM, no JVD, no scleral icterus Cardiac: RRR, no murmurs, rubs, or gallops.  Respiratory: Clear to auscultation bilaterally. GI: Soft, nontender, non-distended  MS: No edema; No deformity. Neuro:  Nonfocal  Vasc:  +2 radial pulses  Patient is doing well and remains chest pain-free and without dyspnea.  Coronary CTA demonstrated no high-grade obstructive disease.  His echocardiogram demonstrated preserved LV function with grade 1 diastolic dysfunction and left ventricular hypertrophy.  For this reason we will continue valsartan  320 mg and start SGLT2 inhibitor as an outpatient.  Patient will resume Xarelto .  Discharge today with close hospital follow-up.  I did discuss the need for an ARB and SGLT2 inhibitor given the patient's diastolic dysfunction.  APP discharge time:20 MD discharge time:22  Lurena Red, MD Pager [phone]

## 2024-03-12 NOTE — Telephone Encounter (Signed)
   Reached out to patient after seeing that valsartan  was on his home med list and he was sent home with losartan . Instructed him to only take the valsartan . He expresses understanding. All questions were answered.  Waddell DELENA Donath, PA-C 03/12/2024 4:09 PM

## 2024-03-12 NOTE — H&P (Addendum)
 Cardiology Admission History and Physical   Patient ID: Joshua Stanton MRN: [id number]; DOB: March 02, 1961   Admission date: 03/11/2024  PCP:  Clinic, [facility] Providers Cardiologist:  Alm Clay, MD  Electrophysiologist:  Will Gladis Norton, MD      Chief Complaint:  Chest pain, elevated troponin  Patient Profile: Joshua Stanton is a 63 y.o. male with severe obesity, HTN, OSA, insulin resistance and paroxysmal A.Fib who is being seen 03/12/2024 for the evaluation of ED.  History of Present Illness: Joshua Stanton is a 63 year old male with severe obesity, hypertension, OSA, insulin resistance, and paroxysmal A-fib who presents with chest pain.  Briefly, follows with A-fib clinic for symptomatic paroxysmal A-fib and has trialed multi antiarrhythmic therapies but has remained symptomatic; therefore, is planned to undergo ablation at the end of September.  He was transitioned to amiodarone  with vast improvement in his A-fib burden and symptoms.  Today around 11 AM he began having substernal chest pressure radiating to left arm which is different than his palpitations from his A-fib.  He did note on his device that he was in some sort of SVT with an tachycardic which improved with propranolol  but he states he was having residual chest pressure radiating to the left arm after restoration of normal rhythm.  He presented to urgent care and then was transferred to outside ED and by that time his chest pain had resolved.  Of note he had a normal left heart cath back in 2017 for chest pain symptoms.  There he was noted to be afebrile and hemodynamically stable.  In sinus rhythm.  EKG demonstrated sinus bradycardia with first-degree AV block and inferior lateral T wave inversions that were somewhat dynamic but have been previously seen on old EKGs as well.  Troponin 31-25.  Was given aspirin  and started on heparin  and transferred for further care.   Past  Medical History:  Diagnosis Date   Arthritis    lower back (09/28/2015)   Atypical angina (HCC) dx'd 09/2015   GERD (gastroesophageal reflux disease)    High cholesterol    History of hiatal hernia    Hypertension    OSA on CPAP    PAF (paroxysmal atrial fibrillation) (HCC) 01/04/2021   Prediabetes    Seasonal allergies    Past Surgical History:  Procedure Laterality Date   ANKLE HARDWARE REMOVAL Left 2006   left    BIOPSY  03/09/2019   Procedure: BIOPSY;  Surgeon: Dianna Specking, MD;  Location: [facility];  Service: Endoscopy;;   CARDIAC CATHETERIZATION N/A 09/28/2015   Procedure: Left Heart Cath and Coronary Angiography;  Surgeon: Deatrice DELENA Cage, MD;  Location: [facility];  Service: Cardiovascular;: For possible atypical angina, mildly elevated troponins.  Angiographically normal coronary arteries   CARDIAC EVENT MONITOR  09/2018   Heart rate range 47 bpm - 190 bpm.  Average heart rate 70 bpm.  Low burden of PACs and PVCs.  3 short runs of SVT/PAT with aberrancy versus V. tach.  25 bursts of PAT 3 episodes of what appear to be atrial tach versus atrial fib with a rate of 190 bpm    COLONOSCOPY WITH PROPOFOL  N/A 03/09/2019   Procedure: COLONOSCOPY WITH PROPOFOL ;  Surgeon: Dianna Specking, MD;  Location: [facility];  Service: Endoscopy;  Laterality: N/A;   ESOPHAGOGASTRODUODENOSCOPY (EGD) WITH PROPOFOL  N/A 03/09/2019   Procedure: ESOPHAGOGASTRODUODENOSCOPY (EGD) WITH PROPOFOL ;  Surgeon: Dianna Specking, MD;  Location: [facility];  Service: Endoscopy;  Laterality:  N/A;   KNEE ARTHROSCOPY  09/20/2011   Procedure: ARTHROSCOPY KNEE;  Surgeon: Dempsey LULLA Moan, MD;  Location: [facility];  Service: Orthopedics;  Laterality: Right;  Medial meniscal DEBRIDEMENT with chondroplasty, right knee   KNEE ARTHROSCOPY Left 04/07/2013   Procedure: LEFT KNEE ARTHROSCOPY WITH MEDIAL MENISCUS  DEBRIDEMENT ;  Surgeon: Dempsey LULLA Moan, MD;  Location: [facility];  Service:  Orthopedics;  Laterality: Left;   LIPOMA EXCISION Right 2004   neck   LYMPHADENECTOMY Bilateral 08/30/2021   Procedure: LYMPHADENECTOMY, PELVIC;  Surgeon: Renda Glance, MD;  Location: [facility];  Service: Urology;  Laterality: Bilateral;   NASAL SEPTUM SURGERY  1992   ORIF ANKLE FRACTURE Left 1981   ROBOT ASSISTED LAPAROSCOPIC RADICAL PROSTATECTOMY N/A 08/30/2021   Procedure: XI ROBOTIC ASSISTED LAPAROSCOPIC RADICAL PROSTATECTOMY LEVEL 3;  Surgeon: Renda Glance, MD;  Location: [facility];  Service: Urology;  Laterality: N/A;   TONSILLECTOMY  2008   TRANSTHORACIC ECHOCARDIOGRAM  02/28/2021   EF 60 to 65%.  No R WMA.  Diastolic function, but unable to quantify.  Normal RV.  Normal valves.   UVULOPALATOPHARYNGOPLASTY  2008     Medications Prior to Admission: Prior to Admission medications   Medication Sig Start Date End Date Taking? Authorizing Provider  amiodarone  (PACERONE ) 200 MG tablet Take 1 tablet (200 mg total) by mouth 2 (two) times daily for 30 days, THEN 1 tablet (200 mg total) daily. 01/16/24 02/14/25  Fenton, Clint R, PA  ASHWAGANDHA PO Take 3 tablets by mouth in the morning. WITH MAGNESIUM GLYCINATE-    [provider]  atorvastatin  (LIPITOR ) 40 MG tablet Take 40 mg by mouth every other day.    [provider]  diltiazem  (CARDIZEM  CD) 120 MG 24 hr capsule TAKE 1 CAPSULE(120 MG) BY MOUTH DAILY 02/25/22   Anner Alm ORN, MD  gabapentin  (NEURONTIN ) 300 MG capsule Take 300-600 mg by mouth 2 (two) times daily as needed (pain).    [provider]  magnesium gluconate (MAGONATE) 500 MG tablet Take 500 mg by mouth at bedtime.    [provider]  melatonin 3 MG TABS tablet Take 1 tablet by mouth at bedtime.    [provider]  Multiple Vitamin (MULTIVITAMIN ADULT PO) Take 1 tablet by mouth every morning.    [provider]  pantoprazole  (PROTONIX ) 20 MG tablet Take 20 mg by mouth daily. 03/07/23   [provider]  propranolol  (INDERAL )  20 MG tablet TAKE 1/2- 1 TABLET BY MOUTH AS NEEDED FOR PALPITATIONS Patient taking differently: Take 10 mg by mouth as needed. 01/29/22   Anner Alm ORN, MD  rivaroxaban  (XARELTO ) 20 MG TABS tablet Take 1 tablet (20 mg total) by mouth daily with supper. 02/02/24   Fenton, Clint R, PA  sildenafil (VIAGRA) 100 MG tablet Take 100 mg by mouth daily as needed. Patient taking differently: Take 100 mg by mouth as needed. 08/12/22   [provider]  tadalafil (CIALIS) 5 MG tablet Take 5 mg by mouth every evening.    [provider]  valsartan  (DIOVAN ) 320 MG tablet TAKE 1 TABLET(320 MG) BY MOUTH DAILY 01/29/23   Camnitz, Soyla Lunger, MD     Allergies:    Allergies  Allergen Reactions   Morphine  Nausea And Vomiting   Carvedilol  Other (See Comments)    Makes patient feel like his moving in slow motion   Hydrocodone -Acetaminophen      Other reaction(s): feels like a hang over   Oxycodone  Nausea Only  Trazodone Hcl     Other reaction(s): priapism    Social History:   Social History   Socioeconomic History   Marital status: Married    Spouse name: Not on file   Number of children: Not on file   Years of education: Not on file   Highest education level: Not on file  Occupational History   Not on file  Tobacco Use   Smoking status: Never   Smokeless tobacco: Former    Types: Chew    Quit date: 09/16/2001   Tobacco comments:    Former Chew 03/21/2021  Vaping Use   Vaping status: Never Used  Substance and Sexual Activity   Alcohol use: Not Currently   Drug use: Not Currently    Types: Marijuana    Comment: quit in the 1990s   Sexual activity: Yes  Other Topics Concern   Not on file  Social History Narrative   Not on file   Social Drivers of Health   Financial Resource Strain: Not on file  Food Insecurity: No Food Insecurity (03/12/2024)   Hunger Vital Sign    Worried About Running Out of Food in the Last Year: Never true    Ran Out of Food in the Last Year:  Never true  Transportation Needs: No Transportation Needs (03/12/2024)   PRAPARE - Administrator, Civil Service (Medical): No    Lack of Transportation (Non-Medical): No  Physical Activity: Not on file  Stress: Not on file  Social Connections: Moderately Integrated (03/12/2024)   Social Connection and Isolation Panel    Frequency of Communication with Friends and Family: More than three times a week    Frequency of Social Gatherings with Friends and Family: Three times a week    Attends Religious Services: More than 4 times per year    Active Member of Clubs or Organizations: No    Attends Banker Meetings: Never    Marital Status: Married  Catering manager Violence: Not At Risk (03/12/2024)   Humiliation, Afraid, Rape, and Kick questionnaire    Fear of Current or Ex-Partner: No    Emotionally Abused: No    Physically Abused: No    Sexually Abused: No    Family History:   The patient's family history includes Dementia in his mother; Hypertension in his mother.    ROS:  Please see the history of present illness.  All other ROS reviewed and negative.     Physical Exam/Data: Vitals:   03/11/24 2315 03/12/24 0129 03/12/24 0209 03/12/24 0255  BP: (!) 148/86   (!) 158/86  Pulse: (!) 52   60  Resp: 10   15  Temp:  98.5 F (36.9 C) 98.5 F (36.9 C) 97.9 F (36.6 C)  TempSrc:  Oral Oral Oral  SpO2: 99%   97%  Weight:    123 kg  Height:        Intake/Output Summary (Last 24 hours) at 03/12/2024 0345 Last data filed at 03/12/2024 0300 Gross per 24 hour  Intake 23.36 ml  Output --  Net 23.36 ml      03/12/2024    2:55 AM 03/11/2024   11:00 PM 03/11/2024    6:09 PM  Last 3 Weights  Weight (lbs) 271 lb 1.6 oz 276 lb 14.4 oz 276 lb 14.4 oz  Weight (kg) 122.97 kg 125.6 kg 125.6 kg     Body mass index is 42.46 kg/m.  General:  Well nourished, well developed, in no acute  distress, obese HEENT: normal Neck: no JVD Vascular: No carotid bruits; Distal  pulses 2+ bilaterally   Cardiac:  normal S1, S2; +S4, RRR; no murmur  Lungs:  clear to auscultation bilaterally, no wheezing, rhonchi or rales  Abd: soft, nontender, no hepatomegaly  Ext: trace edema Musculoskeletal:  No deformities, BUE and BLE strength normal and equal Skin: warm and dry  Neuro:  CNs 2-12 intact, no focal abnormalities noted Psych:  Normal affect   EKG:  The ECG that was done was personally reviewed and demonstrates sinus bradycardia with first degree block, dynamic TWI inferior lateral  Relevant CV Studies:  2022 TTE IMPRESSIONS    1. Left ventricular ejection fraction, by estimation, is 60 to 65%. The  left ventricle has normal function. The left ventricle has no regional  wall motion abnormalities. The left ventricular internal cavity size was  mildly dilated. Left ventricular  diastolic parameters are indeterminate. Elevated left ventricular  end-diastolic pressure.   2. Right ventricular systolic function is normal. The right ventricular  size is normal. Tricuspid regurgitation signal is inadequate for assessing  PA pressure.   3. The mitral valve is normal in structure. Mild mitral valve  regurgitation. No evidence of mitral stenosis.   4. The aortic valve is normal in structure. Aortic valve regurgitation is  not visualized. No aortic stenosis is present.   5. Aortic dilatation noted. There is mild dilatation of the ascending  aorta, measuring 40 mm.   6. The inferior vena cava is normal in size with greater than 50%  respiratory variability, suggesting right atrial pressure of 3 mmHg.   LHC 2017 1. Normal coronary arteries. 2. Normal LV systolic function with an ejection fraction of 55-60%. 3. Moderate systemic hypertension and moderately elevated left ventricular end-diastolic pressure.   Recommendations: The patient likely has hypertensive heart disease. Recommend blood pressure control. I added losartan . He is already on  carvedilol .   Laboratory Data: High Sensitivity Troponin:  No results for input(s): TROPONINIHS in the last 720 hours.    Chemistry Recent Labs  Lab 03/11/24 2008  NA 142  K 3.9  CL 104  CO2 27  GLUCOSE 92  BUN 14  CREATININE 1.24  CALCIUM  9.4  GFRNONAA >60  ANIONGAP 11    No results for input(s): PROT, ALBUMIN, AST, ALT, ALKPHOS, BILITOT in the last 168 hours. Lipids No results for input(s): CHOL, TRIG, HDL, LABVLDL, LDLCALC, CHOLHDL in the last 168 hours. Hematology Recent Labs  Lab 03/11/24 2008  WBC 7.7  RBC 4.71  HGB 13.7  HCT 42.6  MCV 90.4  MCH 29.1  MCHC 32.2  RDW 13.2  PLT 228   Thyroid No results for input(s): TSH, FREET4 in the last 168 hours. BNPNo results for input(s): BNP, PROBNP in the last 168 hours.  DDimer No results for input(s): DDIMER in the last 168 hours.  Radiology/Studies:  DG Chest 2 View Result Date: 03/11/2024 CLINICAL DATA:  Chest pain. EXAM: CHEST - 2 VIEW COMPARISON:  Chest radiograph dated 03/25/2020. FINDINGS: No focal consolidation, pleural effusion, or pneumothorax. Stable cardiac silhouette. No acute osseous pathology. IMPRESSION: No active cardiopulmonary disease. Electronically Signed   By: Vanetta Chou M.D.   On: 03/11/2024 20:18   Assessment and Plan:  NSTEMI Metabolic syndrome  Patient presents with unstable anginal type symptoms with minimally elevated troponin consistent with NSTEMI versus myocardial injury.  He has ongoing risk factors of obesity, hypertension, likely diabetes at this point, which places him at higher risk for developing  coronary artery disease since 2017.  Given his chest pain was different than his palpitations and persisted despite sinus rhythm; felt higher risk and treated as unstable angina/NSTEMI.  It is possible that his chest pain was related to paroxysm of A-fib.  He is due for CT scan prior to his ablation; if availability could do coronary CT in addition  tomorrow vs. left heart catheterization.  Last Xarelto  dose was 8/28 at 6 PM. - Heparin  per pharmacy - TTE - ASA - Lipids and A1c - Coronary CT versus left heart cath, n.p.o. in case - Continue atorvastatin   Paroxysmal A.Fib Sinus rhythm at this time. -Continue amiodarone , diltiazem  -Continue telemetry -Heparin  as above  4.  OSA Not using CPAP  Risk Assessment/Risk Scores:   TIMI Risk Score for Unstable Angina or Non-ST Elevation MI:   The patient's TIMI risk score is 3, which indicates a 13% risk of all cause mortality, new or recurrent myocardial infarction or need for urgent revascularization in the next 14 days.    CHA2DS2-VASc Score = 1   This indicates a 0.6% annual risk of stroke. The patient's score is based upon: CHF History: 0 HTN History: 1 Diabetes History: 0 Stroke History: 0 Vascular Disease History: 0 Age Score: 0 Gender Score: 0     Code Status: Full Code  Severity of Illness: The appropriate patient status for this patient is INPATIENT. Inpatient status is judged to be reasonable and necessary in order to provide the required intensity of service to ensure the patient's safety. The patient's presenting symptoms, physical exam findings, and initial radiographic and laboratory data in the context of their chronic comorbidities is felt to place them at high risk for further clinical deterioration. Furthermore, it is not anticipated that the patient will be medically stable for discharge from the hospital within 2 midnights of admission.   * I certify that at the point of admission it is my clinical judgment that the patient will require inpatient hospital care spanning beyond 2 midnights from the point of admission due to high intensity of service, high risk for further deterioration and high frequency of surveillance required.*  For questions or updates, please contact [facility] Please consult [email or web address] for contact info under      Signed, Ozell Bushman, MD  03/12/2024 3:45 AM    ATTENDING ATTESTATION:  After conducting a review of all available clinical information with the care team, interviewing the patient, and performing a physical exam, I agree with the findings and plan described in this note.   GEN: No acute distress, AO x 3 HEENT:  MMM, no JVD, no scleral icterus Cardiac: RRR, no murmurs, rubs, or gallops.  Respiratory: Clear to auscultation bilaterally. GI: Soft, nontender, non-distended  MS: No edema; No deformity. Neuro:  Nonfocal  Vasc:  +2 radial pulses  The patient is a 63 year old male with a history of paroxysmal atrial fibrillation who is scheduled for ablation next month, prediabetes, hypertension, obstructive sleep apnea, and BMI 42 who was admitted due to chest pain and elevated troponin.  The patient was in his normal state of health up until yesterday when he developed chest pressure.  He noted that he was tachycardic.  He sought urgent medical attention but by the time he was evaluated in the emergency department he had converted to an sinus bradycardia.  Overnight he remains chest pain-free..  Will obtain coronary CTA and echocardiogram to evaluate further.  Patient tells me he has no exertional angina at home.  Further  recommendations will be informed by the results of these tests.  Lurena Red, MD Pager [phone]

## 2024-03-12 NOTE — Addendum Note (Signed)
 Addended by: NATALIA BIRMINGHAM on: 03/12/2024 04:12 PM   Modules accepted: Orders

## 2024-03-12 NOTE — Progress Notes (Addendum)
 Rounding Note   Patient Name: Joshua Stanton Date of Encounter: 03/12/2024  [facility] Cardiologist: Alm Clay, MD   Subjective No further chest pain since about 5:30pm last evening. Last dose of xarelto  was in the evening 8/27.  He recounts this chest pain was similar to CP he had in 2017 that lead to a heart cath that showed normal coronaries - this was also prior to Afib.   Scheduled Meds:  amiodarone   200 mg Oral Daily   aspirin  EC  81 mg Oral Daily   atorvastatin   40 mg Oral QODAY   diltiazem   120 mg Oral Daily   pantoprazole   20 mg Oral Daily   Continuous Infusions:  heparin  1,400 Units/hr (03/12/24 0300)   PRN Meds: acetaminophen , gabapentin , ondansetron  (ZOFRAN ) IV   Vital Signs  Vitals:   03/12/24 0129 03/12/24 0209 03/12/24 0255 03/12/24 0738  BP:   (!) 158/86 138/77  Pulse:   60 (!) 52  Resp:   15 16  Temp: 98.5 F (36.9 C) 98.5 F (36.9 C) 97.9 F (36.6 C) 97.9 F (36.6 C)  TempSrc: Oral Oral Oral Oral  SpO2:   97% 99%  Weight:   123 kg   Height:        Intake/Output Summary (Last 24 hours) at 03/12/2024 0901 Last data filed at 03/12/2024 0300 Gross per 24 hour  Intake 23.36 ml  Output --  Net 23.36 ml      03/12/2024    2:55 AM 03/11/2024   11:00 PM 03/11/2024    6:09 PM  Last 3 Weights  Weight (lbs) 271 lb 1.6 oz 276 lb 14.4 oz 276 lb 14.4 oz  Weight (kg) 122.97 kg 125.6 kg 125.6 kg      Telemetry Sinus bradycardia with HR 40s-50s - Personally Reviewed  ECG  No new tracings - Personally Reviewed  Physical Exam  GEN: obese male in no acute distress.   Neck: No JVD Cardiac: RRR, no murmurs, rubs, or gallops.  Respiratory: Clear to auscultation bilaterally. GI: Soft, nontender, non-distended  MS: No edema; No deformity. Neuro:  Nonfocal  Psych: Normal affect   Labs High Sensitivity Troponin:  No results for input(s): TROPONINIHS in the last 720 hours.   Chemistry Recent Labs  Lab 03/11/24 2008  NA 142   K 3.9  CL 104  CO2 27  GLUCOSE 92  BUN 14  CREATININE 1.24  CALCIUM  9.4  GFRNONAA >60  ANIONGAP 11    Lipids No results for input(s): CHOL, TRIG, HDL, LABVLDL, LDLCALC, CHOLHDL in the last 168 hours.  Hematology Recent Labs  Lab 03/11/24 2008 03/12/24 0815  WBC 7.7 6.0  RBC 4.71 4.70  HGB 13.7 13.6  HCT 42.6 41.9  MCV 90.4 89.1  MCH 29.1 28.9  MCHC 32.2 32.5  RDW 13.2 13.0  PLT 228 230   Thyroid No results for input(s): TSH, FREET4 in the last 168 hours.  BNPNo results for input(s): BNP, PROBNP in the last 168 hours.  DDimer No results for input(s): DDIMER in the last 168 hours.   Radiology  DG Chest 2 View Result Date: 03/11/2024 CLINICAL DATA:  Chest pain. EXAM: CHEST - 2 VIEW COMPARISON:  Chest radiograph dated 03/25/2020. FINDINGS: No focal consolidation, pleural effusion, or pneumothorax. Stable cardiac silhouette. No acute osseous pathology. IMPRESSION: No active cardiopulmonary disease. Electronically Signed   By: Vanetta Chou M.D.   On: 03/11/2024 20:18    Cardiac Studies  Echo pending  Echo 2022: 1. Left  ventricular ejection fraction, by estimation, is 60 to 65%. The  left ventricle has normal function. The left ventricle has no regional  wall motion abnormalities. The left ventricular internal cavity size was  mildly dilated. Left ventricular  diastolic parameters are indeterminate. Elevated left ventricular  end-diastolic pressure.   2. Right ventricular systolic function is normal. The right ventricular  size is normal. Tricuspid regurgitation signal is inadequate for assessing  PA pressure.   3. The mitral valve is normal in structure. Mild mitral valve  regurgitation. No evidence of mitral stenosis.   4. The aortic valve is normal in structure. Aortic valve regurgitation is  not visualized. No aortic stenosis is present.   5. Aortic dilatation noted. There is mild dilatation of the ascending  aorta, measuring 40 mm.   6.  The inferior vena cava is normal in size with greater than 50%  respiratory variability, suggesting right atrial pressure of 3 mmHg.   Patient Profile   63 y.o. male with severe obesity, HTN, OSA, insulin resistance and paroxysmal A.Fib who is being seen 03/12/2024 for the evaluation of ED.   Assessment & Plan   Chest pain - presented with CP concerning for angina - chest pain in the center of his chest radiated to left arm without diaphoresis and nausea, some mild DOE while in RVR - states palpitations started first, then CP - CP persisted after conversion to SB with propranolol   - mild troponin elevation: 31 --> 25 - EKG with TWI lateral leads (old) and new TWI inferior leads - he took propranolol  for RVR and had resolution of chest pain about 2 hours later - he states he had chest pain even after conversion back to SB - given presentation, recommend ischemic evaluation - consider CT coronary in tandem with already scheduled pre-ablation CT vs definitive angiography  Afib with RVR Paroxysmal Afib - has done well on PO amiodarone , PO cardizem , and propranolol  25 mg PRN prior to this admission - has failed other anti-arrhythmics in the past and is now scheduled for Afib ablation 04/09/24 - presented with Afib RVR  Chronic anticoagulation - xarelto  for stroke PPX - currently on hold  - currently running heparin  gtt  Hx of normal coronaries  - by heart cath 2017  Hypertension - controlled with cardizem   Hyperlipidemia with LDL goal < 70 - continue lipitor        For questions or updates, please contact [facility] Please consult [email or web address] for contact info under     Signed, Jon Nat Hails, PA  03/12/2024, 9:01 AM    ATTENDING ATTESTATION:  After conducting a review of all available clinical information with the care team, interviewing the patient, and performing a physical exam, I agree with the findings and plan described in this note.   GEN: No acute  distress, AO x 3 HEENT:  MMM, no JVD, no scleral icterus Cardiac: RRR, no murmurs, rubs, or gallops.  Respiratory: Clear to auscultation bilaterally. GI: Soft, nontender, non-distended  MS: No edema; No deformity. Neuro:  Nonfocal  Vasc:  +2 radial pulses  The patient remains stable this morning without cardiovascular complaints.  Remains in normal sinus rhythm.  His troponins are only mildly elevated.  I believe this likely represents a type II myocardial infarction from rapid atrial fibrillation.  Agree with obtaining coronary CTA to evaluate further.  Will follow-up echocardiogram and hemoglobin A1c.  If hemoglobin A1c is consistent with diabetes or patient has diastolic dysfunction we will plan on starting SGLT2  inhibitor.  Coronary CTA and echo results will inform further therapy.  If both of these are reassuring then we will plan on discharge later today on Xarelto  with planned PVI next month.   Lurena Red, MD Pager [phone]

## 2024-03-12 NOTE — Plan of Care (Signed)

## 2024-03-12 NOTE — Progress Notes (Signed)
  Echocardiogram 2D Echocardiogram has been performed.  Joshua Stanton 03/12/2024, 10:43 AM

## 2024-03-12 NOTE — Progress Notes (Addendum)
 PHARMACY - ANTICOAGULATION CONSULT NOTE  Pharmacy Consult for heparin  Indication: chest pain/ACS, atrial fibrillation  Allergies  Allergen Reactions   Morphine  Nausea And Vomiting   Carvedilol  Other (See Comments)    Makes patient feel like his moving in slow motion   Hydrocodone -Acetaminophen      Other reaction(s): feels like a hang over   Oxycodone  Nausea Only   Trazodone Hcl     Other reaction(s): priapism    Patient Measurements: Height: 5' 7 (170.2 cm) Weight: 123 kg (271 lb 1.6 oz) IBW/kg (Calculated) : 66.1 HEPARIN  DW (KG): 95.5  Vital Signs: Temp: 97.9 F (36.6 C) (08/29 0738) Temp Source: Oral (08/29 0738) BP: 138/77 (08/29 0738) Pulse Rate: 52 (08/29 0738)  Labs: Recent Labs    03/11/24 2008 03/12/24 0815  HGB 13.7 13.6  HCT 42.6 41.9  PLT 228 230  APTT  --  63*  HEPARINUNFRC  --  0.69  CREATININE 1.24  --     Estimated Creatinine Clearance: 78.7 mL/min (by C-G formula based on SCr of 1.24 mg/dL).   Medical History: Past Medical History:  Diagnosis Date   Arthritis    lower back (09/28/2015)   Atypical angina (HCC) dx'd 09/2015   GERD (gastroesophageal reflux disease)    High cholesterol    History of hiatal hernia    Hypertension    OSA on CPAP    PAF (paroxysmal atrial fibrillation) (HCC) 01/04/2021   Prediabetes    Seasonal allergies    Assessment: 5 yoM presented to ED with chest pain. Pharmacy consulted to dose heparin  for ACS. PMH includes atrial fibrillation (PTA Xarelto ).  -Last dose of Xarelto  8/27 PM -HL 0.69 (recent DOAC), aPTT 63 sec below goal on 1400 units/hr.  CBC stable - no infusion issues/bleeding noted. -Planning cCTA today, keeping heparin  with possible cath later on   Goal of Therapy:  Heparin  level 0.3-0.7 units/ml aPTT 66-102 seconds Monitor platelets by anticoagulation protocol: Yes   Plan:  Increase heparin  IV to 1500 units/hr. Check aPTT and anti-Xa level in 6 hours and daily while on heparin  Monitor  with aPTTs until correlates with heparin  level Continue to monitor H&H and platelets F/u cCTA  Post rounds update - no plans for cath, OK to switch back to PTA Xarelto .  Maurilio Fila, PharmD Clinical Pharmacist 03/12/2024  9:02 AM

## 2024-03-12 NOTE — TOC CM/SW Note (Addendum)
 Transition of Care [facility]) - Inpatient Brief Assessment   Patient Details  Name: Joshua Stanton MRN: [id number] Date of Birth: 1960-08-14  Transition of Care [facility]) CM/SW Contact:    Sudie Erminio Deems, RN Phone Number: 03/12/2024, 1:53 PM   Clinical Narrative: Patient presented for chest pain-Nstemi. PTA patient was from home with spouse. Patient has a PCP at the [facility] he has transportation to appointments. Case Manager did call the [facility] Fees Coordinator to make them aware of hospitalization. Patient has DME CPAP and 02 via the [place]. No home needs identified at this time. Case Manager will continue to follow for additional needs as the patient progresses.    1401 Case Manager received call back from [employer]  and the PCP at the [facility] is Cintron-Lorenzo and the CSW is Nat Bird @ [phone] ext [phone].  Transition of Care Asessment: Insurance and Status: Insurance coverage has been reviewed Patient has primary care physician: Yes [facility]) Home environment has been reviewed: reviewed Prior level of function:: independent Prior/Current Home Services: No current home services Social Drivers of Health Review: SDOH reviewed no interventions necessary Readmission risk has been reviewed: Yes Transition of care needs: no transition of care needs at this time

## 2024-03-13 LAB — HEMOGLOBIN A1C
Hgb A1c MFr Bld: 6.4 % — ABNORMAL HIGH (ref 4.8–5.6)
Mean Plasma Glucose: 137 mg/dL

## 2024-03-19 ENCOUNTER — Ambulatory Visit (HOSPITAL_COMMUNITY)

## 2024-03-29 ENCOUNTER — Ambulatory Visit (HOSPITAL_COMMUNITY)
Admit: 2024-03-29 | Discharge: 2024-03-29 | Disposition: A | Discharge status: Home / Self Care | Attending: Physician Assistant | Admitting: Physician Assistant

## 2024-03-29 VITALS — BP 152/78 | HR 81 | Ht 67.0 in | Wt 271.0 lb

## 2024-03-29 DIAGNOSIS — I4891 Unspecified atrial fibrillation: Secondary | ICD-10-CM | POA: Diagnosis not present

## 2024-03-29 DIAGNOSIS — I480 Paroxysmal atrial fibrillation: Secondary | ICD-10-CM

## 2024-03-29 DIAGNOSIS — Z5181 Encounter for therapeutic drug level monitoring: Secondary | ICD-10-CM | POA: Diagnosis not present

## 2024-03-29 DIAGNOSIS — Z79899 Other long term (current) drug therapy: Secondary | ICD-10-CM

## 2024-03-29 NOTE — H&P (View-Only) (Signed)
 Primary Care Physician: [facility] Referring Physician: Dr. Inocencio  Cardiologist: Dr. Anner  Primary EP: Dr Inocencio Bartlett [name] is a 63 y.o. male with a h/o paroxysmal afib, bradycardia, that was recently referred to Dr. Kelsie from Dr. Anner, for monitor showing  predominate sinus rhythm, bursts of PAT as well as SVT/afib. He had a minimal  heart rate of 43 bpm, maximum 130 bpm, with an average of 64 bpm.   Pt is using flecainide  and metoprolol  for afib episodes which responds to the drugs pretty fast. Dr. Kelsie suggested to use diltiazem  daily as pt  did not tolerate the metoprolol  which  would make him feel very fatigued.   Pt took the diltiazem  x 3 days but then had a afib episode which lasted for most of the day, so he stopped drug. He was not aware that he could still use the prn flecainide  with it. He is in SR today at 56 bpm. He states that when his back pain flares he notices more heart rhythm irregularity.   Follow up in the AF clinic 05/24/22. Patient was seen by Dr Inocencio 02/2022 and started on propafenone . Patient reports that when he takes the medication BID his afib is well controlled. If he ever misses a dose, he notices tachypalpitations. He does report a significant increase in his anxiety about his health recently.   Follow up in the AF clinic 04/15/23. Patient reports that he will have an afib episode 1-3 times per week. Kardia strips personally reviewed today. He admits that he has only been taking his propafenone  once daily. No bleeding issues on anticoagulation.   Follow up 10/09/23. Patient returns for follow up for atrial fibrillation. He reports that he is having more frequent afib for the past 1-2 months. He admits he still misses doses of propafenone . He has several episodes of afib per week, some lasting 30-45 minutes. He is very fatigued after the episodes.   Follow up 01/12/24. Patient returns for follow up for atrial fibrillation and  propafenone  monitoring. He is in SR today with frequent PACs. He reports that he has symptoms of afib almost daily. He is scheduled for an ablation with Dr Inocencio in September.   Follow up 02/02/24. Patient returns for follow up for atrial fibrillation and amiodarone  monitoring. He reports since starting amiodarone  he has had much fewer afib episodes. He remains in SR today. He is tolerating the medication without difficulty.   Follow up 03/29/24. Patient presented to the ED from urgent care 03/11/24 with chest pain. He began having substernal chest pressure radiating to left arm which is different than his palpitations from his A-fib. He did note on his device that he was in some sort of SVT with an tachycardic which improved with propranolol  but he states he was having residual chest pressure radiating to the left arm after restoration of normal rhythm. He presented to urgent care and then was transferred to outside ED and by that time his chest pain had resolved. A cardiac CTA was ordered which showed 0 calcium  score. He is in rate controlled afib today feeling fatigued. Fortunately no further chest pain. No bleeding issues on anticoagulation.   Today, he  denies symptoms of chest pain, shortness of breath, orthopnea, PND, lower extremity edema, dizziness, presyncope, syncope, snoring, daytime somnolence, bleeding, or neurologic sequela. The patient is tolerating medications without difficulties and is otherwise without complaint today.    Past Medical History:  Diagnosis Date  Arthritis    lower back (09/28/2015)   Atypical angina (HCC) dx'd 09/2015   GERD (gastroesophageal reflux disease)    High cholesterol    History of hiatal hernia    Hypertension    OSA on CPAP    PAF (paroxysmal atrial fibrillation) (HCC) 01/04/2021   Prediabetes    Seasonal allergies     Current Outpatient Medications  Medication Sig Dispense Refill   acetaminophen  (TYLENOL ) 500 MG tablet Take 1,000 mg by mouth 2  (two) times daily as needed for headache, moderate pain (pain score 4-6) or fever. (Patient taking differently: Take 1,000 mg by mouth as needed for headache, moderate pain (pain score 4-6) or fever.)     amiodarone  (PACERONE ) 200 MG tablet Take 1 tablet (200 mg total) by mouth 2 (two) times daily for 30 days, THEN 1 tablet (200 mg total) daily. 60 tablet 3   ASHWAGANDHA PO Take 1 tablet by mouth at bedtime.     atorvastatin  (LIPITOR ) 40 MG tablet Take 40 mg by mouth every evening.     diltiazem  (CARDIZEM  CD) 120 MG 24 hr capsule TAKE 1 CAPSULE(120 MG) BY MOUTH DAILY 90 capsule 3   empagliflozin  (JARDIANCE ) 10 MG TABS tablet Take 1 tablet (10 mg total) by mouth daily. 30 tablet 0   gabapentin  (NEURONTIN ) 300 MG capsule Take 300-600 mg by mouth 2 (two) times daily as needed (neuropathy).     MAGNESIUM GLYCINATE PO Take 1 tablet by mouth every evening.     melatonin 3 MG TABS tablet Take 1 tablet by mouth at bedtime.     pantoprazole  (PROTONIX ) 20 MG tablet Take 20 mg by mouth daily.     propranolol  (INDERAL ) 20 MG tablet TAKE 1/2- 1 TABLET BY MOUTH AS NEEDED FOR PALPITATIONS 90 tablet 3   rivaroxaban  (XARELTO ) 20 MG TABS tablet Take 1 tablet (20 mg total) by mouth daily with supper. 30 tablet 3   tadalafil (CIALIS) 20 MG tablet Take 20 mg by mouth daily as needed (ED). (Patient taking differently: Take 5 mg by mouth daily as needed (ED).)     valsartan  (DIOVAN ) 320 MG tablet TAKE 1 TABLET(320 MG) BY MOUTH DAILY 90 tablet 3   Multiple Vitamin (MULTI VITAMIN) TABS Take 1 tablet by mouth every morning.     No current facility-administered medications for this encounter.    ROS- All systems are reviewed and negative except as per the HPI above  Physical Exam: Vitals:   03/29/24 1029  BP: (!) 152/78  Pulse: 81  Weight: 122.9 kg  Height: 5' 7 (1.702 m)     Wt Readings from Last 3 Encounters:  03/29/24 122.9 kg  03/12/24 123 kg  03/11/24 125.6 kg    GEN: Well nourished, well developed  in no acute distress CARDIAC: Irregularly irregular rate and rhythm, no murmurs, rubs, gallops RESPIRATORY:  Clear to auscultation without rales, wheezing or rhonchi  ABDOMEN: Soft, non-tender, non-distended EXTREMITIES:  No edema; No deformity     EKG today demonstrates Afib Vent. rate 81 BPM PR interval * ms QRS duration 108 ms QT/QTcB 372/432 ms   CHA2DS2-VASc Score = 1  The patient's score is based upon: CHF History: 0 HTN History: 1 Diabetes History: 0 Stroke History: 0 Vascular Disease History: 0 Age Score: 0 Gender Score: 0       ASSESSMENT AND PLAN: Paroxysmal Atrial Fibrillation/atrial flutter/atrial tachycardia (ICD10:  I48.0) The patient's CHA2DS2-VASc score is 1, indicating a 0.6% annual risk of stroke.   Patient scheduled  for afib ablation 04/09/24 He is in rate controlled afib today.  Will have him take amiodarone  200 mg BID until ablation then decrease to once daily. Continue Xarelto  20 mg daily Continue diltiazem  120 mg daily Continue propranolol  25 mg PRN for heart racing.   High Risk Medication Monitoring (ICD 10: J342684) Patient requires ongoing monitoring for anti-arrhythmic medication which has the potential to cause life threatening arrhythmias. Intervals on ECG acceptable for amiodarone  monitoring.   HTN Mildly elevated today, will reassess in SR.   Obesity Body mass index is 42.44 kg/m.  Encouraged lifestyle modification  OSA  Encouraged nightly CPAP   Follow up for afib ablation as scheduled. AF clinic one month post ablation.    Daril Kicks PA-C [facility] [address] [place], [place] [address] [phone]

## 2024-03-29 NOTE — Patient Instructions (Signed)
 Amiodarone  200mg  twice a day until ablation

## 2024-03-29 NOTE — Progress Notes (Signed)
 Primary Care Physician: [facility] Referring Physician: Dr. Inocencio  Cardiologist: Dr. Anner  Primary EP: Dr Inocencio [name] is a 63 y.o. male with a h/o paroxysmal afib, bradycardia, that was recently referred to Dr. Kelsie from Dr. Anner, for monitor showing  predominate sinus rhythm, bursts of PAT as well as SVT/afib. He had a minimal  heart rate of 43 bpm, maximum 130 bpm, with an average of 64 bpm.   Pt is using flecainide  and metoprolol  for afib episodes which responds to the drugs pretty fast. Dr. Kelsie suggested to use diltiazem  daily as pt  did not tolerate the metoprolol  which  would make him feel very fatigued.   Pt took the diltiazem  x 3 days but then had a afib episode which lasted for most of the day, so he stopped drug. He was not aware that he could still use the prn flecainide  with it. He is in SR today at 56 bpm. He states that when his back pain flares he notices more heart rhythm irregularity.   Follow up in the AF clinic 05/24/22. Patient was seen by Dr Inocencio 02/2022 and started on propafenone . Patient reports that when he takes the medication BID his afib is well controlled. If he ever misses a dose, he notices tachypalpitations. He does report a significant increase in his anxiety about his health recently.   Follow up in the AF clinic 04/15/23. Patient reports that he will have an afib episode 1-3 times per week. Kardia strips personally reviewed today. He admits that he has only been taking his propafenone  once daily. No bleeding issues on anticoagulation.   Follow up 10/09/23. Patient returns for follow up for atrial fibrillation. He reports that he is having more frequent afib for the past 1-2 months. He admits he still misses doses of propafenone . He has several episodes of afib per week, some lasting 30-45 minutes. He is very fatigued after the episodes.   Follow up 01/12/24. Patient returns for follow up for atrial fibrillation and  propafenone  monitoring. He is in SR today with frequent PACs. He reports that he has symptoms of afib almost daily. He is scheduled for an ablation with Dr Inocencio in September.   Follow up 02/02/24. Patient returns for follow up for atrial fibrillation and amiodarone  monitoring. He reports since starting amiodarone  he has had much fewer afib episodes. He remains in SR today. He is tolerating the medication without difficulty.   Follow up 03/29/24. Patient presented to the ED from urgent care 03/11/24 with chest pain. He began having substernal chest pressure radiating to left arm which is different than his palpitations from his A-fib. He did note on his device that he was in some sort of SVT with an tachycardic which improved with propranolol  but he states he was having residual chest pressure radiating to the left arm after restoration of normal rhythm. He presented to urgent care and then was transferred to outside ED and by that time his chest pain had resolved. A cardiac CTA was ordered which showed 0 calcium  score. He is in rate controlled afib today feeling fatigued. Fortunately no further chest pain. No bleeding issues on anticoagulation.   Today, he  denies symptoms of chest pain, shortness of breath, orthopnea, PND, lower extremity edema, dizziness, presyncope, syncope, snoring, daytime somnolence, bleeding, or neurologic sequela. The patient is tolerating medications without difficulties and is otherwise without complaint today.    Past Medical History:  Diagnosis Date  Arthritis    lower back (09/28/2015)   Atypical angina (HCC) dx'd 09/2015   GERD (gastroesophageal reflux disease)    High cholesterol    History of hiatal hernia    Hypertension    OSA on CPAP    PAF (paroxysmal atrial fibrillation) (HCC) 01/04/2021   Prediabetes    Seasonal allergies     Current Outpatient Medications  Medication Sig Dispense Refill   acetaminophen  (TYLENOL ) 500 MG tablet Take 1,000 mg by mouth 2  (two) times daily as needed for headache, moderate pain (pain score 4-6) or fever. (Patient taking differently: Take 1,000 mg by mouth as needed for headache, moderate pain (pain score 4-6) or fever.)     amiodarone  (PACERONE ) 200 MG tablet Take 1 tablet (200 mg total) by mouth 2 (two) times daily for 30 days, THEN 1 tablet (200 mg total) daily. 60 tablet 3   ASHWAGANDHA PO Take 1 tablet by mouth at bedtime.     atorvastatin  (LIPITOR ) 40 MG tablet Take 40 mg by mouth every evening.     diltiazem  (CARDIZEM  CD) 120 MG 24 hr capsule TAKE 1 CAPSULE(120 MG) BY MOUTH DAILY 90 capsule 3   empagliflozin  (JARDIANCE ) 10 MG TABS tablet Take 1 tablet (10 mg total) by mouth daily. 30 tablet 0   gabapentin  (NEURONTIN ) 300 MG capsule Take 300-600 mg by mouth 2 (two) times daily as needed (neuropathy).     MAGNESIUM GLYCINATE PO Take 1 tablet by mouth every evening.     melatonin 3 MG TABS tablet Take 1 tablet by mouth at bedtime.     pantoprazole  (PROTONIX ) 20 MG tablet Take 20 mg by mouth daily.     propranolol  (INDERAL ) 20 MG tablet TAKE 1/2- 1 TABLET BY MOUTH AS NEEDED FOR PALPITATIONS 90 tablet 3   rivaroxaban  (XARELTO ) 20 MG TABS tablet Take 1 tablet (20 mg total) by mouth daily with supper. 30 tablet 3   tadalafil (CIALIS) 20 MG tablet Take 20 mg by mouth daily as needed (ED). (Patient taking differently: Take 5 mg by mouth daily as needed (ED).)     valsartan  (DIOVAN ) 320 MG tablet TAKE 1 TABLET(320 MG) BY MOUTH DAILY 90 tablet 3   Multiple Vitamin (MULTI VITAMIN) TABS Take 1 tablet by mouth every morning.     No current facility-administered medications for this encounter.    ROS- All systems are reviewed and negative except as per the HPI above  Physical Exam: Vitals:   03/29/24 1029  BP: (!) 152/78  Pulse: 81  Weight: 122.9 kg  Height: 5' 7 (1.702 m)     Wt Readings from Last 3 Encounters:  03/29/24 122.9 kg  03/12/24 123 kg  03/11/24 125.6 kg    GEN: Well nourished, well developed  in no acute distress CARDIAC: Irregularly irregular rate and rhythm, no murmurs, rubs, gallops RESPIRATORY:  Clear to auscultation without rales, wheezing or rhonchi  ABDOMEN: Soft, non-tender, non-distended EXTREMITIES:  No edema; No deformity     EKG today demonstrates Afib Vent. rate 81 BPM PR interval * ms QRS duration 108 ms QT/QTcB 372/432 ms   CHA2DS2-VASc Score = 1  The patient's score is based upon: CHF History: 0 HTN History: 1 Diabetes History: 0 Stroke History: 0 Vascular Disease History: 0 Age Score: 0 Gender Score: 0       ASSESSMENT AND PLAN: Paroxysmal Atrial Fibrillation/atrial flutter/atrial tachycardia (ICD10:  I48.0) The patient's CHA2DS2-VASc score is 1, indicating a 0.6% annual risk of stroke.   Patient scheduled  for afib ablation 04/09/24 He is in rate controlled afib today.  Will have him take amiodarone  200 mg BID until ablation then decrease to once daily. Continue Xarelto  20 mg daily Continue diltiazem  120 mg daily Continue propranolol  25 mg PRN for heart racing.   High Risk Medication Monitoring (ICD 10: [id number]) Patient requires ongoing monitoring for anti-arrhythmic medication which has the potential to cause life threatening arrhythmias. Intervals on ECG acceptable for amiodarone  monitoring.   HTN Mildly elevated today, will reassess in SR.   Obesity Body mass index is 42.44 kg/m.  Encouraged lifestyle modification  OSA  Encouraged nightly CPAP   Follow up for afib ablation as scheduled. AF clinic one month post ablation.    Daril Kicks PA-C [facility] [address] [place], [place] [address] [phone]

## 2024-04-08 NOTE — Pre-Procedure Instructions (Signed)
 Instructed patient on the following items: Arrival time 1000 Nothing to eat or drink after midnight No meds AM of procedure Responsible person to drive you home and stay with you for 24 hrs  Have you missed any doses of anti-coagulant Xarelto - takes once a day, hasn't missed any doses in 4 weeks.

## 2024-04-09 ENCOUNTER — Encounter (HOSPITAL_COMMUNITY)
Admission: RE | Disposition: A | Discharge status: Home / Self Care | Payer: Self-pay | Source: Home / Self Care | Attending: Cardiology

## 2024-04-09 ENCOUNTER — Ambulatory Visit (HOSPITAL_COMMUNITY)
Admission: RE | Admit: 2024-04-09 | Discharge: 2024-04-09 | Disposition: A | Discharge status: Home / Self Care | Attending: Cardiology | Admitting: Cardiology

## 2024-04-09 ENCOUNTER — Ambulatory Visit (HOSPITAL_BASED_OUTPATIENT_CLINIC_OR_DEPARTMENT_OTHER): Admitting: Anesthesiology

## 2024-04-09 ENCOUNTER — Ambulatory Visit (HOSPITAL_COMMUNITY): Admitting: Anesthesiology

## 2024-04-09 ENCOUNTER — Other Ambulatory Visit: Payer: Self-pay

## 2024-04-09 DIAGNOSIS — K219 Gastro-esophageal reflux disease without esophagitis: Secondary | ICD-10-CM | POA: Diagnosis not present

## 2024-04-09 DIAGNOSIS — Z8546 Personal history of malignant neoplasm of prostate: Secondary | ICD-10-CM | POA: Insufficient documentation

## 2024-04-09 DIAGNOSIS — I10 Essential (primary) hypertension: Secondary | ICD-10-CM | POA: Insufficient documentation

## 2024-04-09 DIAGNOSIS — F419 Anxiety disorder, unspecified: Secondary | ICD-10-CM | POA: Diagnosis not present

## 2024-04-09 DIAGNOSIS — Z6841 Body Mass Index (BMI) 40.0 and over, adult: Secondary | ICD-10-CM | POA: Insufficient documentation

## 2024-04-09 DIAGNOSIS — E119 Type 2 diabetes mellitus without complications: Secondary | ICD-10-CM | POA: Insufficient documentation

## 2024-04-09 DIAGNOSIS — E669 Obesity, unspecified: Secondary | ICD-10-CM | POA: Insufficient documentation

## 2024-04-09 DIAGNOSIS — I483 Typical atrial flutter: Secondary | ICD-10-CM | POA: Insufficient documentation

## 2024-04-09 DIAGNOSIS — Z79899 Other long term (current) drug therapy: Secondary | ICD-10-CM | POA: Diagnosis not present

## 2024-04-09 DIAGNOSIS — G4733 Obstructive sleep apnea (adult) (pediatric): Secondary | ICD-10-CM | POA: Insufficient documentation

## 2024-04-09 DIAGNOSIS — I480 Paroxysmal atrial fibrillation: Secondary | ICD-10-CM | POA: Insufficient documentation

## 2024-04-09 DIAGNOSIS — Z7901 Long term (current) use of anticoagulants: Secondary | ICD-10-CM | POA: Diagnosis not present

## 2024-04-09 DIAGNOSIS — I4891 Unspecified atrial fibrillation: Secondary | ICD-10-CM | POA: Diagnosis not present

## 2024-04-09 LAB — BASIC METABOLIC PANEL WITH GFR
Anion gap: 12 (ref 5–15)
BUN: 11 mg/dL (ref 8–23)
CO2: 26 mmol/L (ref 22–32)
Calcium: 8.6 mg/dL — ABNORMAL LOW (ref 8.9–10.3)
Chloride: 107 mmol/L (ref 98–111)
Creatinine, Ser: 1.34 mg/dL — ABNORMAL HIGH (ref 0.61–1.24)
GFR, Estimated: >60 mL/min (ref >60)
Glucose, Bld: 105 mg/dL — ABNORMAL HIGH (ref 70–99)
Potassium: 3.1 mmol/L — ABNORMAL LOW (ref 3.5–5.1)
Sodium: 145 mmol/L (ref 135–145)

## 2024-04-09 LAB — POCT ACTIVATED CLOTTING TIME: Activated Clotting Time: 366 s

## 2024-04-09 MED ORDER — PHENYLEPHRINE 80 MCG/ML (10ML) SYRINGE FOR IV PUSH (FOR BLOOD PRESSURE SUPPORT)
PREFILLED_SYRINGE | INTRAVENOUS | Status: DC | PRN
  Administered 2024-04-09: 80 ug via INTRAVENOUS

## 2024-04-09 MED ORDER — PROPOFOL 10 MG/ML IV BOLUS
INTRAVENOUS | Status: DC | PRN
  Administered 2024-04-09: 120 mg via INTRAVENOUS
  Administered 2024-04-09: 30 mg via INTRAVENOUS

## 2024-04-09 MED ORDER — FENTANYL CITRATE (PF) 100 MCG/2ML IJ SOLN
INTRAMUSCULAR | Status: AC
  Filled 2024-04-09: qty 2

## 2024-04-09 MED ORDER — SODIUM CHLORIDE 0.9 % IV SOLN
INTRAVENOUS | Status: DC
Start: 2024-04-09 — End: 2024-04-09

## 2024-04-09 MED ORDER — SODIUM CHLORIDE 0.9 % IV SOLN: INTRAVENOUS | Status: DC | PRN

## 2024-04-09 MED ORDER — ROCURONIUM BROMIDE 10 MG/ML (PF) SYRINGE
PREFILLED_SYRINGE | INTRAVENOUS | Status: DC | PRN
  Administered 2024-04-09: 50 mg via INTRAVENOUS

## 2024-04-09 MED ORDER — ATROPINE SULFATE 1 MG/ML IV SOLN
INTRAVENOUS | Status: DC | PRN
Start: 2024-04-09 — End: 2024-04-09
  Administered 2024-04-09: 1 mg via INTRAVENOUS

## 2024-04-09 MED ORDER — PROTAMINE SULFATE 10 MG/ML IV SOLN
INTRAVENOUS | Status: DC | PRN
  Administered 2024-04-09: 40 mg via INTRAVENOUS

## 2024-04-09 MED ORDER — ONDANSETRON HCL 4 MG/2ML IJ SOLN: 4.0000 mg | Freq: Four times a day (QID) | INTRAMUSCULAR | Status: DC | PRN

## 2024-04-09 MED ORDER — ONDANSETRON HCL 4 MG/2ML IJ SOLN
INTRAMUSCULAR | Status: DC | PRN
  Administered 2024-04-09: 4 mg via INTRAVENOUS

## 2024-04-09 MED ORDER — DEXAMETHASONE SODIUM PHOSPHATE 10 MG/ML IJ SOLN
INTRAMUSCULAR | Status: DC | PRN
  Administered 2024-04-09: 10 mg via INTRAVENOUS

## 2024-04-09 MED ORDER — PHENYLEPHRINE HCL-NACL 20-0.9 MG/250ML-% IV SOLN
INTRAVENOUS | Status: DC | PRN
  Administered 2024-04-09: 25 ug/min via INTRAVENOUS

## 2024-04-09 MED ORDER — HEPARIN (PORCINE) IN NACL 1000-0.9 UT/500ML-% IV SOLN
INTRAVENOUS | Status: DC | PRN
  Administered 2024-04-09 (×3): 500 mL

## 2024-04-09 MED ORDER — HEPARIN SODIUM (PORCINE) 1000 UNIT/ML IJ SOLN
INTRAMUSCULAR | Status: DC | PRN
  Administered 2024-04-09: 15000 [IU] via INTRAVENOUS

## 2024-04-09 MED ORDER — LIDOCAINE 2% (20 MG/ML) 5 ML SYRINGE
INTRAMUSCULAR | Status: DC | PRN
  Administered 2024-04-09: 100 mg via INTRAVENOUS

## 2024-04-09 MED ORDER — SUGAMMADEX SODIUM 200 MG/2ML IV SOLN
INTRAVENOUS | Status: DC | PRN
  Administered 2024-04-09: 400 mg via INTRAVENOUS

## 2024-04-09 MED ORDER — FENTANYL CITRATE (PF) 250 MCG/5ML IJ SOLN
INTRAMUSCULAR | Status: DC | PRN
  Administered 2024-04-09: 100 ug via INTRAVENOUS

## 2024-04-09 MED ORDER — PROPOFOL 500 MG/50ML IV EMUL: INTRAVENOUS | Status: DC | PRN

## 2024-04-09 NOTE — Anesthesia Postprocedure Evaluation (Signed)
 Anesthesia Post Note  Patient: Joshua Stanton  Procedure(s) Performed: ATRIAL FIBRILLATION ABLATION     Patient location during evaluation: PACU Anesthesia Type: General Level of consciousness: awake and alert Pain management: pain level controlled Vital Signs Assessment: post-procedure vital signs reviewed and stable Respiratory status: spontaneous breathing, nonlabored ventilation and respiratory function stable Cardiovascular status: blood pressure returned to baseline Postop Assessment: no apparent nausea or vomiting Anesthetic complications: no   There were no known notable events for this encounter.  Last Vitals:  Vitals:   04/09/24 1500 04/09/24 1515  BP: (!) 145/81 136/81  Pulse: 68 73  Resp: 11 (!) 21  Temp:    SpO2: 94% 95%    Last Pain:  Vitals:   04/09/24 1348  TempSrc:   PainSc: 0-No pain   Pain Goal:                   Vertell Row

## 2024-04-09 NOTE — Progress Notes (Signed)
 Patient ambulated to the bathroom and was able to void.Bilateral groin dressings clean, dry, intact.No hematoma or bleeding noted. Discharge instructions given to the patient with no concerns voiced. Patient has seen the provider.

## 2024-04-09 NOTE — Transfer of Care (Signed)
 Immediate Anesthesia Transfer of Care Note  Patient: Joshua Stanton  Procedure(s) Performed: ATRIAL FIBRILLATION ABLATION  Patient Location: PACU  Anesthesia Type:General  Level of Consciousness: awake, alert , oriented, and patient cooperative  Airway & Oxygen Therapy: Patient Spontanous Breathing and Patient connected to face mask oxygen  Post-op Assessment: Report given to RN, Post -op Vital signs reviewed and stable, and Patient moving all extremities  Post vital signs: Reviewed and stable  Last Vitals:  Vitals Value Taken Time  BP 129/79 04/09/24 12:54  Temp    Pulse 72 04/09/24 12:58  Resp 10 04/09/24 12:58  SpO2 98 % 04/09/24 12:58  Vitals shown include unfiled device data.  Last Pain:  Vitals:   04/09/24 1254  TempSrc:   PainSc: 0-No pain         Complications: There were no known notable events for this encounter.

## 2024-04-09 NOTE — Interval H&P Note (Signed)
 History and Physical Interval Note:  04/09/2024 10:44 AM  Joshua Stanton  has presented today for surgery, with the diagnosis of afib.  The various methods of treatment have been discussed with the patient and family. After consideration of risks, benefits and other options for treatment, the patient has consented to  Procedure(s): ATRIAL FIBRILLATION ABLATION (N/A) as a surgical intervention.  The patient's history has been reviewed, patient examined, no change in status, stable for surgery.  I have reviewed the patient's chart and labs.  Questions were answered to the patient's satisfaction.     Jayanth Szczesniak [employer]

## 2024-04-09 NOTE — Anesthesia Procedure Notes (Signed)
 Procedure Name: Intubation Date/Time: 04/09/2024 11:25 AM  Performed by: Arvell Edsel HERO, CRNAPre-anesthesia Checklist: Patient identified, Emergency Drugs available, Suction available, Patient being monitored and Timeout performed Patient Re-evaluated:Patient Re-evaluated prior to induction Oxygen Delivery Method: Circle system utilized Preoxygenation: Pre-oxygenation with 100% oxygen Induction Type: IV induction Ventilation: Two handed mask ventilation required and Oral airway inserted - appropriate to patient size Laryngoscope Size: Glidescope and 4 Grade View: Grade II Tube size: 7.5 mm Number of attempts: 1 Airway Equipment and Method: Patient positioned with wedge pillow, Stylet and Video-laryngoscopy Placement Confirmation: ETT inserted through vocal cords under direct vision, positive ETCO2, CO2 detector and breath sounds checked- equal and bilateral Secured at: 24 cm Tube secured with: Tape

## 2024-04-09 NOTE — Anesthesia Preprocedure Evaluation (Addendum)
 Anesthesia Evaluation  Patient identified by MRN, date of birth, ID band Patient awake    Reviewed: Allergy  & Precautions, H&P , NPO status , Patient's Chart, lab work & pertinent test results  History of Anesthesia Complications Negative for: history of anesthetic complications  Airway Mallampati: III  TM Distance: >3 FB Neck ROM: Full    Dental no notable dental hx.    Pulmonary shortness of breath and with exertion, sleep apnea and Continuous Positive Airway Pressure Ventilation    Pulmonary exam normal breath sounds clear to auscultation       Cardiovascular hypertension, (-) angina + Past MI and + DOE  Normal cardiovascular exam+ dysrhythmias Atrial Fibrillation  Rhythm:Regular Rate:Normal  TTE 02/2024  IMPRESSIONS     1. Left ventricular ejection fraction, by estimation, is 50 to 55%. Left  ventricular ejection fraction by PLAX is 68 %. The left ventricle has low  normal function. The left ventricle demonstrates global hypokinesis. There  is moderate concentric left  ventricular hypertrophy. Left ventricular diastolic parameters are  consistent with Grade I diastolic dysfunction (impaired relaxation).   2. Right ventricular systolic function is normal. The right ventricular  size is normal. There is normal pulmonary artery systolic pressure. The  estimated right ventricular systolic pressure is 17.4 mmHg.   3. The mitral valve is grossly normal. Trivial mitral valve  regurgitation.   4. The aortic valve is tricuspid. Aortic valve regurgitation is not  visualized. No aortic stenosis is present.   5. Aortic dilatation noted. There is borderline dilatation of the  ascending aorta, measuring 38 mm.   6. The inferior vena cava is normal in size with greater than 50%  respiratory variability, suggesting right atrial pressure of 3 mmHg     Neuro/Psych  PSYCHIATRIC DISORDERS Anxiety     negative neurological ROS      GI/Hepatic Neg liver ROS, hiatal hernia,GERD  ,,  Endo/Other  diabetes, Type 2    Renal/GU negative Renal ROS   Prostate cancer    Musculoskeletal  (+) Arthritis ,    Abdominal   Peds negative pediatric ROS (+)  Hematology negative hematology ROS (+)   Anesthesia Other Findings   Reproductive/Obstetrics negative OB ROS                              Anesthesia Physical Anesthesia Plan  ASA: 3  Anesthesia Plan: General   Post-op Pain Management: Minimal or no pain anticipated   Induction: Intravenous  PONV Risk Score and Plan: 2 and Ondansetron , Dexamethasone , Treatment may vary due to age or medical condition and Midazolam   Airway Management Planned: Oral ETT and Video Laryngoscope Planned  Additional Equipment: None  Intra-op Plan:   Post-operative Plan: Extubation in OR  Informed Consent: I have reviewed the patients History and Physical, chart, labs and discussed the procedure including the risks, benefits and alternatives for the proposed anesthesia with the patient or authorized representative who has indicated his/her understanding and acceptance.     Dental advisory given  Plan Discussed with: CRNA  Anesthesia Plan Comments:          Anesthesia Quick Evaluation

## 2024-04-09 NOTE — Discharge Instructions (Signed)

## 2024-04-11 ENCOUNTER — Encounter (HOSPITAL_COMMUNITY): Payer: Self-pay | Admitting: Cardiology

## 2024-04-12 ENCOUNTER — Telehealth (HOSPITAL_COMMUNITY): Payer: Self-pay

## 2024-04-12 MED FILL — Fentanyl Citrate Preservative Free (PF) Inj 100 MCG/2ML: INTRAMUSCULAR | Qty: 2 | Status: AC

## 2024-04-12 NOTE — Telephone Encounter (Signed)
 Attempted to reach patient to follow up with procedure completed on 04/09/24, no answer. Left VM for patient to return call.

## 2024-04-16 ENCOUNTER — Encounter: Payer: Self-pay | Admitting: Cardiology

## 2024-04-20 ENCOUNTER — Ambulatory Visit: Payer: Self-pay | Admitting: Cardiology

## 2024-05-06 ENCOUNTER — Telehealth: Payer: Self-pay | Admitting: Cardiology

## 2024-05-06 NOTE — Telephone Encounter (Signed)
 We received a [facility] Disability form.  Patient has been informed that he needs to come in to sign the Release of Information and pay the $29 form fee.  I gave the form to Maeola Domino, RN who will pass it to Dr. Inocencio.

## 2024-05-07 ENCOUNTER — Ambulatory Visit (HOSPITAL_COMMUNITY)
Admission: RE | Admit: 2024-05-07 | Discharge: 2024-05-07 | Disposition: A | Discharge status: Home / Self Care | Source: Ambulatory Visit | Attending: Physician Assistant | Admitting: Physician Assistant

## 2024-05-07 VITALS — BP 124/70 | HR 54 | Ht 67.0 in | Wt 265.8 lb

## 2024-05-07 DIAGNOSIS — Z5181 Encounter for therapeutic drug level monitoring: Secondary | ICD-10-CM | POA: Diagnosis not present

## 2024-05-07 DIAGNOSIS — Z79899 Other long term (current) drug therapy: Secondary | ICD-10-CM | POA: Diagnosis not present

## 2024-05-07 DIAGNOSIS — I480 Paroxysmal atrial fibrillation: Secondary | ICD-10-CM | POA: Diagnosis not present

## 2024-05-07 DIAGNOSIS — I4891 Unspecified atrial fibrillation: Secondary | ICD-10-CM

## 2024-05-07 DIAGNOSIS — Z0279 Encounter for issue of other medical certificate: Secondary | ICD-10-CM

## 2024-05-07 NOTE — Progress Notes (Signed)
 Primary Care Physician: [facility] Referring Physician: Dr. Inocencio  Cardiologist: Dr. Anner  Primary EP: Dr Inocencio Bartlett Joshua Stanton is a 63 y.o. male with a h/o paroxysmal afib, bradycardia, that was recently referred to Dr. Kelsie from Dr. Anner, for monitor showing  predominate sinus rhythm, bursts of PAT as well as SVT/afib. He had a minimal  heart rate of 43 bpm, maximum 130 bpm, with an average of 64 bpm.   Pt is using flecainide  and metoprolol  for afib episodes which responds to the drugs pretty fast. Dr. Kelsie suggested to use diltiazem  daily as pt  did not tolerate the metoprolol  which  would make him feel very fatigued.   Pt took the diltiazem  x 3 days but then had a afib episode which lasted for most of the day, so he stopped drug. He was not aware that he could still use the prn flecainide  with it. He is in SR today at 56 bpm. He states that when his back pain flares he notices more heart rhythm irregularity.   Follow up in the AF clinic 05/24/22. Patient was seen by Dr Inocencio 02/2022 and started on propafenone . Patient reports that when he takes the medication BID his afib is well controlled. If he ever misses a dose, he notices tachypalpitations. He does report a significant increase in his anxiety about his health recently.   Follow up in the AF clinic 04/15/23. Patient reports that he will have an afib episode 1-3 times per week. Kardia strips personally reviewed today. He admits that he has only been taking his propafenone  once daily. No bleeding issues on anticoagulation.   Follow up 10/09/23. Patient returns for follow up for atrial fibrillation. He reports that he is having more frequent afib for the past 1-2 months. He admits he still misses doses of propafenone . He has several episodes of afib per week, some lasting 30-45 minutes. He is very fatigued after the episodes.   Follow up 01/12/24. Patient returns for follow up for atrial fibrillation and  propafenone  monitoring. He is in SR today with frequent PACs. He reports that he has symptoms of afib almost daily. He is scheduled for an ablation with Dr Inocencio in September.   Follow up 02/02/24. Patient returns for follow up for atrial fibrillation and amiodarone  monitoring. He reports since starting amiodarone  he has had much fewer afib episodes. He remains in SR today. He is tolerating the medication without difficulty.   Follow up 03/29/24. Patient presented to the ED from urgent care 03/11/24 with chest pain. He began having substernal chest pressure radiating to left arm which is different than his palpitations from his A-fib. He did note on his device that he was in some sort of SVT with an tachycardic which improved with propranolol  but he states he was having residual chest pressure radiating to the left arm after restoration of normal rhythm. He presented to urgent care and then was transferred to outside ED and by that time his chest pain had resolved. A cardiac CTA was ordered which showed 0 calcium  score. He is in rate controlled afib today feeling fatigued. Fortunately no further chest pain. No bleeding issues on anticoagulation.   Follow up 05/07/24. Patient returns for follow up for atrial fibrillation. Patient is s/p afib and flutter ablation on 04/09/24. He remains in SR today and feels well. He denies any significant chest pain or groin issues. No interim symptoms of afib. No bleeding issues on anticoagulation.  Today, he  denies symptoms of palpitations, chest pain, shortness of breath, orthopnea, PND, lower extremity edema, dizziness, presyncope, syncope, bleeding, or neurologic sequela. The patient is tolerating medications without difficulties and is otherwise without complaint today.    Past Medical History:  Diagnosis Date   Arthritis    lower back (09/28/2015)   Atypical angina dx'd 09/2015   GERD (gastroesophageal reflux disease)    High cholesterol    History of hiatal  hernia    Hypertension    OSA on CPAP    PAF (paroxysmal atrial fibrillation) (HCC) 01/04/2021   Prediabetes    Seasonal allergies     Current Outpatient Medications  Medication Sig Dispense Refill   acetaminophen  (TYLENOL ) 500 MG tablet Take 1,000 mg by mouth 2 (two) times daily as needed for headache, moderate pain (pain score 4-6) or fever.     amiodarone  (PACERONE ) 200 MG tablet Take 1 tablet (200 mg total) by mouth 2 (two) times daily for 30 days, THEN 1 tablet (200 mg total) daily. 60 tablet 3   atorvastatin  (LIPITOR ) 40 MG tablet Take 40 mg by mouth every evening.     diltiazem  (CARDIZEM  CD) 120 MG 24 hr capsule TAKE 1 CAPSULE(120 MG) BY MOUTH DAILY 90 capsule 3   empagliflozin  (JARDIANCE ) 25 MG TABS tablet Take 12.5 mg by mouth daily.     gabapentin  (NEURONTIN ) 300 MG capsule Take 300-600 mg by mouth 2 (two) times daily as needed (neuropathy).     MAGNESIUM GLYCINATE PO Take 1 tablet by mouth every evening.     melatonin 3 MG TABS tablet Take 1 tablet by mouth at bedtime.     Multiple Vitamin (MULTI VITAMIN) TABS Take 1 tablet by mouth every morning.     pantoprazole  (PROTONIX ) 20 MG tablet Take 20 mg by mouth daily.     propranolol  (INDERAL ) 20 MG tablet TAKE 1/2- 1 TABLET BY MOUTH AS NEEDED FOR PALPITATIONS 90 tablet 3   rivaroxaban  (XARELTO ) 20 MG TABS tablet Take 1 tablet (20 mg total) by mouth daily with supper. 30 tablet 3   tadalafil (CIALIS) 20 MG tablet Take 20 mg by mouth daily as needed (ED).     triamcinolone  ointment (KENALOG ) 0.1 % Apply topically as needed.     valsartan  (DIOVAN ) 320 MG tablet TAKE 1 TABLET(320 MG) BY MOUTH DAILY 90 tablet 3   No current facility-administered medications for this encounter.    ROS- All systems are reviewed and negative except as per the HPI above  Physical Exam: Vitals:   05/07/24 1031  BP: 124/70  Pulse: (!) 54  Weight: 120.6 kg  Height: 5' 7 (1.702 m)    Wt Readings from Last 3 Encounters:  05/07/24 120.6 kg   04/09/24 121.6 kg  03/29/24 122.9 kg    GEN: Well nourished, well developed in no acute distress CARDIAC: Regular rate and rhythm, no murmurs, rubs, gallops RESPIRATORY:  Clear to auscultation without rales, wheezing or rhonchi  ABDOMEN: Soft, non-tender, non-distended EXTREMITIES:  No edema; No deformity    EKG today demonstrates SB Vent. rate 54 BPM PR interval 204 ms QRS duration 106 ms QT/QTcB 456/432 ms   CHA2DS2-VASc Score = 1  The patient's score is based upon: CHF History: 0 HTN History: 1 Diabetes History: 0 Stroke History: 0 Vascular Disease History: 0 Age Score: 0 Gender Score: 0       ASSESSMENT AND PLAN: Paroxysmal Atrial Fibrillation/atrial flutter/atrial tachycardia (ICD10:  I48.0) The patient's CHA2DS2-VASc score is 1, indicating a  0.6% annual risk of stroke.   S/p afib and flutter ablation 04/09/24 Patient appears to be maintaining SR Continue amiodarone  200 mg daily for now.  Continue Xarelto  20 mg daily with no missed doses for 3 months post ablation.  Continue diltiazem  120 mg daily Continue propranolol  25 mg PRN for heart racing.   High Risk Medication Monitoring (ICD 10: [id number]) Patient requires ongoing monitoring for anti-arrhythmic medication which has the potential to cause life threatening arrhythmias. Intervals on ECG acceptable for amiodarone  monitoring.   HTN Stable on current regimen  Obesity Body mass index is 41.63 kg/m.  Encouraged lifestyle modification  OSA  Encouraged nightly CPAP   Follow up in the AF clinic in 2 months.     Daril Kicks PA-C [facility] [address] [place], [place] [address] [phone]

## 2024-05-11 NOTE — Telephone Encounter (Signed)
 Paper work completed & signed, placed in Joshua Stanton's box

## 2024-05-12 NOTE — Telephone Encounter (Signed)
 Disability form faxed to [employer] and scanned into chart. Patient and billing notified.

## 2024-05-12 NOTE — Telephone Encounter (Signed)
 Disability form for [facility] scanned to chart.  Patient and billing notified.

## 2024-05-14 ENCOUNTER — Telehealth: Payer: Self-pay | Admitting: Cardiology

## 2024-05-14 NOTE — Telephone Encounter (Signed)
 FMLA payment posted to pt's chart.  JB, 05-14-24

## 2024-05-19 ENCOUNTER — Encounter: Payer: Self-pay | Admitting: Cardiology

## 2024-05-24 ENCOUNTER — Encounter: Payer: Self-pay | Admitting: Cardiology

## 2024-07-01 ENCOUNTER — Ambulatory Visit (HOSPITAL_COMMUNITY)
Admission: RE | Admit: 2024-07-01 | Discharge: 2024-07-01 | Attending: Physician Assistant | Admitting: Physician Assistant

## 2024-07-01 VITALS — BP 160/82 | HR 67 | Ht 67.0 in | Wt 273.0 lb

## 2024-07-01 DIAGNOSIS — I480 Paroxysmal atrial fibrillation: Secondary | ICD-10-CM

## 2024-07-01 DIAGNOSIS — I4891 Unspecified atrial fibrillation: Secondary | ICD-10-CM | POA: Diagnosis not present

## 2024-07-01 DIAGNOSIS — I4819 Other persistent atrial fibrillation: Secondary | ICD-10-CM | POA: Diagnosis not present

## 2024-07-01 DIAGNOSIS — I10 Essential (primary) hypertension: Secondary | ICD-10-CM | POA: Diagnosis not present

## 2024-07-01 NOTE — Progress Notes (Signed)
 Primary Care Physician: [facility] Referring Physician: Dr. Inocencio  Cardiologist: Dr. Anner  Primary EP: Dr Inocencio [name] is a 63 y.o. male with a h/o paroxysmal afib, bradycardia, that was recently referred to Dr. Kelsie from Dr. Anner, for monitor showing  predominate sinus rhythm, bursts of PAT as well as SVT/afib. He had a minimal  heart rate of 43 bpm, maximum 130 bpm, with an average of 64 bpm.   Pt is using flecainide  and metoprolol  for afib episodes which responds to the drugs pretty fast. Dr. Kelsie suggested to use diltiazem  daily as pt  did not tolerate the metoprolol  which  would make him feel very fatigued.   Pt took the diltiazem  x 3 days but then had a afib episode which lasted for most of the day, so he stopped drug. He was not aware that he could still use the prn flecainide  with it. He is in SR today at 56 bpm. He states that when his back pain flares he notices more heart rhythm irregularity.   Follow up in the AF clinic 05/24/22. Patient was seen by Dr Inocencio 02/2022 and started on propafenone . Patient reports that when he takes the medication BID his afib is well controlled. If he ever misses a dose, he notices tachypalpitations. He does report a significant increase in his anxiety about his health recently.   Follow up in the AF clinic 04/15/23. Patient reports that he will have an afib episode 1-3 times per week. Kardia strips personally reviewed today. He admits that he has only been taking his propafenone  once daily. No bleeding issues on anticoagulation.   Follow up 10/09/23. Patient returns for follow up for atrial fibrillation. He reports that he is having more frequent afib for the past 1-2 months. He admits he still misses doses of propafenone . He has several episodes of afib per week, some lasting 30-45 minutes. He is very fatigued after the episodes.   Follow up 01/12/24. Patient returns for follow up for atrial fibrillation and  propafenone  monitoring. He is in SR today with frequent PACs. He reports that he has symptoms of afib almost daily. He is scheduled for an ablation with Dr Inocencio in September.   Follow up 02/02/24. Patient returns for follow up for atrial fibrillation and amiodarone  monitoring. He reports since starting amiodarone  he has had much fewer afib episodes. He remains in SR today. He is tolerating the medication without difficulty.   Follow up 03/29/24. Patient presented to the ED from urgent care 03/11/24 with chest pain. He began having substernal chest pressure radiating to left arm which is different than his palpitations from his A-fib. He did note on his device that he was in some sort of SVT with an tachycardic which improved with propranolol  but he states he was having residual chest pressure radiating to the left arm after restoration of normal rhythm. He presented to urgent care and then was transferred to outside ED and by that time his chest pain had resolved. A cardiac CTA was ordered which showed 0 calcium  score. He is in rate controlled afib today feeling fatigued. Fortunately no further chest pain. No bleeding issues on anticoagulation.   Follow up 05/07/24. Patient returns for follow up for atrial fibrillation. Patient is s/p afib and flutter ablation on 04/09/24. He remains in SR today and feels well. He denies any significant chest pain or groin issues. No interim symptoms of afib. No bleeding issues on anticoagulation.  Follow up 07/01/24. Patient returns for follow up for atrial fibrillation. He remains in SR today and feels well. He has not had any symptoms of afib. He has had some atypical chest pain which has been chronic for him for years. [facility] discontinued amiodarone  6 days ago. No bleeding issues on anticoagulation.   Today, he  denies symptoms of palpitations, shortness of breath, orthopnea, PND, lower extremity edema, dizziness, presyncope, syncope, bleeding, or  neurologic sequela. The patient is tolerating medications without difficulties and is otherwise without complaint today.    Past Medical History:  Diagnosis Date   Arthritis    lower back (09/28/2015)   Atypical angina dx'd 09/2015   GERD (gastroesophageal reflux disease)    High cholesterol    History of hiatal hernia    Hypertension    OSA on CPAP    PAF (paroxysmal atrial fibrillation) (HCC) 01/04/2021   Prediabetes    Seasonal allergies     Current Outpatient Medications  Medication Sig Dispense Refill   acetaminophen  (TYLENOL ) 500 MG tablet Take 1,000 mg by mouth 2 (two) times daily as needed for headache, moderate pain (pain score 4-6) or fever. (Patient taking differently: Take 1,000 mg by mouth as needed for headache, moderate pain (pain score 4-6) or fever.)     atorvastatin  (LIPITOR ) 40 MG tablet Take 40 mg by mouth every evening.     diltiazem  (CARDIZEM  CD) 120 MG 24 hr capsule TAKE 1 CAPSULE(120 MG) BY MOUTH DAILY 90 capsule 3   empagliflozin  (JARDIANCE ) 25 MG TABS tablet Take 12.5 mg by mouth daily.     gabapentin  (NEURONTIN ) 300 MG capsule Take 300-600 mg by mouth 2 (two) times daily as needed (neuropathy). (Patient taking differently: Take 300-600 mg by mouth as needed (neuropathy).)     MAGNESIUM GLYCINATE PO Take 1 tablet by mouth every evening.     melatonin 3 MG TABS tablet Take 1 tablet by mouth at bedtime.     Multiple Vitamin (MULTI VITAMIN) TABS Take 1 tablet by mouth every morning.     pantoprazole  (PROTONIX ) 20 MG tablet Take 20 mg by mouth daily.     propranolol  (INDERAL ) 20 MG tablet TAKE 1/2- 1 TABLET BY MOUTH AS NEEDED FOR PALPITATIONS 90 tablet 3   rivaroxaban  (XARELTO ) 20 MG TABS tablet Take 1 tablet (20 mg total) by mouth daily with supper. 30 tablet 3   tadalafil (CIALIS) 20 MG tablet Take 20 mg by mouth daily as needed (ED). (Patient taking differently: Take 5 mg by mouth every morning.)     valsartan  (DIOVAN ) 320 MG tablet TAKE 1 TABLET(320 MG) BY  MOUTH DAILY 90 tablet 3   amiodarone  (PACERONE ) 200 MG tablet Take 1 tablet (200 mg total) by mouth 2 (two) times daily for 30 days, THEN 1 tablet (200 mg total) daily. (Patient not taking: No sig reported) 60 tablet 3   No current facility-administered medications for this encounter.    ROS- All systems are reviewed and negative except as per the HPI above  Physical Exam: Vitals:   07/01/24 1445  Pulse: 67  Weight: 123.8 kg  Height: 5' 7 (1.702 m)     Wt Readings from Last 3 Encounters:  07/01/24 123.8 kg  05/07/24 120.6 kg  04/09/24 121.6 kg    GEN: Well nourished, well developed in no acute distress CARDIAC: Regular rate and rhythm, no murmurs, rubs, gallops RESPIRATORY:  Clear to auscultation without rales, wheezing or rhonchi  ABDOMEN: Soft, non-tender, non-distended EXTREMITIES:  No  edema; No deformity    EKG Interpretation Date/Time:  Thursday July 01 2024 14:55:49 EST Ventricular Rate:  67 PR Interval:  208 QRS Duration:  108 QT Interval:  414 QTC Calculation: 437 R Axis:   -26  Text Interpretation: Normal sinus rhythm Low voltage QRS ST & T wave abnormality, consider lateral ischemia Abnormal ECG When compared with ECG of 07-May-2024 10:40, No significant change was found Confirmed by Jocelin Schuelke ([id number]) on 07/01/2024 2:58:53 PM    CHA2DS2-VASc Score = 1  The patient's score is based upon: CHF History: 0 HTN History: 1 Diabetes History: 0 Stroke History: 0 Vascular Disease History: 0 Age Score: 0 Gender Score: 0       ASSESSMENT AND PLAN: Paroxysmal Atrial Fibrillation/atrial flutter/atrial tachycardia (ICD10:  I48.0) The patient's CHA2DS2-VASc score is 1, indicating a 0.6% annual risk of stroke.   S/p afib and flutter ablation 04/09/24 Patient appears to be maintaining SR Will stop Xarelto  today with low CV score. Amiodarone  already discontinued.  Continue diltiazem  120 mg daily Continue propranolol  25 mg PRN for heart racing.    HTN Elevated today, typically better controlled at previous visits.  He will keep track of BP at home and reach out to [place] if readings are persistently elevated.   Obesity Body mass index is 42.76 kg/m.  Encouraged lifestyle modification  OSA  Encouraged nightly CPAP   Follow up with Dr Inocencio in 6 months.    Daril Kicks PA-C Afib Clinic [facility] [address] [place], [place] [address] [phone]

## 2024-08-11 ENCOUNTER — Encounter: Payer: Self-pay | Admitting: Cardiology

## 2024-08-11 NOTE — Telephone Encounter (Signed)
 Attempted to call patient. Unable to leave voicemail.

## 2024-08-12 ENCOUNTER — Ambulatory Visit

## 2024-08-12 ENCOUNTER — Encounter: Payer: Self-pay | Admitting: Physician Assistant

## 2024-08-12 VITALS — BP 148/90 | HR 71 | Ht 67.0 in | Wt 272.4 lb

## 2024-08-12 DIAGNOSIS — I480 Paroxysmal atrial fibrillation: Secondary | ICD-10-CM | POA: Diagnosis not present

## 2024-08-12 DIAGNOSIS — I10 Essential (primary) hypertension: Secondary | ICD-10-CM | POA: Diagnosis not present

## 2024-08-12 DIAGNOSIS — G4733 Obstructive sleep apnea (adult) (pediatric): Secondary | ICD-10-CM

## 2024-08-12 DIAGNOSIS — R072 Precordial pain: Secondary | ICD-10-CM

## 2024-08-12 MED ORDER — CHLORTHALIDONE 25 MG PO TABS: 25.0000 mg | ORAL_TABLET | Freq: Every day | ORAL | 3 refills | Status: AC

## 2024-08-12 NOTE — Patient Instructions (Addendum)
 Medication Instructions:  START CHLORTHALIDONE  25 MG DAILY *If you need a refill on your cardiac medications before your next appointment, please call your pharmacy*  Lab Work: BMET IN 1-2 WEEKS If you have labs (blood work) drawn today and your tests are completely normal, you will receive your results only by: MyChart Message (if you have MyChart) OR A paper copy in the mail If you have any lab test that is abnormal or we need to change your treatment, we will call you to review the results.  Testing/Procedures: NO TESTING  Follow-Up: At [facility], you and your health needs are our priority.  As part of our continuing mission to provide you with exceptional heart care, our providers are all part of one team.  This team includes your primary Cardiologist (physician) and Advanced Practice Providers or APPs (Physician Assistants and Nurse Practitioners) who all work together to provide you with the care you need, when you need it.  Your next appointment:   6 month(s)  Provider:   Soyla Gladis Norton, MD

## 2024-08-12 NOTE — Progress Notes (Signed)
 " Cardiology Office Note   Date: 08/12/2024  ID:  Joshua Stanton 1960-10-24 [id number] PCP: Clinic, Joshua [facility] Cardiologist: Joshua Clay, MD Electrophysiologist:  Joshua Gladis Norton, MD     Chief Complaint: Joshua Stanton is a 63 y.o.male with PMH of PAF, OSA on CPAP, mild MR, dilation of ascending aorta that was stable on echo 02/2024, pre-diabetes, obesity who presents to the clinic for evaluation of chest discomfort, hypertension, and bradycardia.    Ms. Joshua Stanton has been followed for hypertension, palpitations, and atrial fibrillation for many years. Had cardiac catheterization 09/2015 that showed normal coronaries. Echo showed LVEF 60-65%, G1DD. Monitor in 2020 showed episodes of possible atrial fibrillation and he was referred to electrophysiology for evaluation. Repeat monitor in 2022 showed possible recurrent atrial fibrillation versus atrial tachycardia. Echo showed LVEF 60-65%, mild MR, dilation of the ascending aorta to 40 mm.   He was seen by Dr. Norton this past year and had worsening symptoms with atrial fibrillation. He began work-up for ablation and ultimately underwent this on 04/09/2024. Work-up remarkable for coronary CTA with calcium  score of 0. Echo showed LVEF 50-55%, moderate LVH, G1DD, ascending aorta dilation stable at 38 mm. [facility]en [facility]/2024 and was maintaining NSR. Xarelto  was discontinued. He did note chronic atypical chest pain. His amiodarone  was discontinued by his [place] cardiologist a few days prior to this visit.  He contacted the office 08/11/2024 with chest pain, elevated blood pressure readings, and low heart rate readings.     History of Present Illness: Today he is concerned about a pinching in his left chest. Episodes like this have happened before, but they were more prominent yesterday. They self-resolve after a couple of seconds.  He does not have associated dyspnea and this symptom does not  worsen with exertion.  Blood pressure readings have been 160s-180s/80s-90s.  Has also noted that his HR was 55-56 on his Kardia mobile device, lower on his BP cuff.  He has not taken propranolol  since prior to his ablation.  He skipped his dose of diltiazem  last night to see if it helped his HR.  He is pleased that he is maintaining NSR on his EKG today.  ROS: Denies lower extremity edema, palpitations, syncope.   Studies Reviewed: The following studies were reviewed today: Cardiac Studies & Procedures   ______________________________________________________________________________________________ CARDIAC CATHETERIZATION  CARDIAC CATHETERIZATION 09/28/2015  Conclusion  The left ventricular systolic function is normal.  1. Normal coronary arteries. 2. Normal LV systolic function with an ejection fraction of 55-60%. 3. Moderate systemic hypertension and moderately elevated left ventricular end-diastolic pressure.  Recommendations: The patient likely has hypertensive heart disease. Recommend blood pressure control. I added losartan . He is already on carvedilol .  Findings Coronary Findings Diagnostic  Dominance: Co-dominant  Left Main . Vessel is angiographically normal.  Left Anterior Descending . Vessel is angiographically normal.  First Diagonal Branch The vessel is angiographically normal.  Second Diagonal Branch The vessel is moderate in size and is angiographically normal.  Third Diagonal Branch The vessel is small in size and is angiographically normal.  Left Circumflex . Vessel is angiographically normal.  First Obtuse Marginal Branch The vessel is small in size and is angiographically normal.  Second Obtuse Marginal Branch The vessel is angiographically normal.  Third Obtuse Marginal Branch The vessel is angiographically normal.  Fourth Obtuse Marginal Branch The vessel is angiographically normal.  Right Coronary Artery . Vessel is angiographically  normal.  Acute Marginal Branch  The vessel is angiographically normal.  Intervention  No interventions have been documented.   STRESS TESTS  NM MYOCAR MULTI W/SPECT W 09/22/2015  Narrative  T wave inversion was noted during stress.  This is a low risk study.  The left ventricular ejection fraction is moderately decreased (30-44%).  Nuclear stress EF: 44%.  1. Fixed, small mild basal to mid inferolateral defect.  Possible attenuation.  No ischemia. 2. EF 44% with diffuse hypokinesis. 3. Overall low risk study.   ECHOCARDIOGRAM  ECHOCARDIOGRAM COMPLETE 03/12/2024  Narrative ECHOCARDIOGRAM REPORT    Patient Name:   [name] Date of Exam: 03/12/2024 Medical Rec #:  [id number]            Height:       67.0 in Accession #:    [id number]           Weight:       271.1 lb Date of Birth:  04-10-1961           BSA:          2.301 m Patient Age:    62 years             BP:           138/77 mmHg Patient Gender: M                    HR:           56 bpm. Exam Location:  Inpatient  Procedure: 2D Echo (Both Spectral and Color Flow Doppler were utilized during procedure).  Indications:    NSTEMI  History:        Patient has prior history of Echocardiogram examinations, most recent 02/28/2021. Signs/Symptoms:Edema; Risk Factors:Sleep Apnea, Dyslipidemia, Hypertension and Diabetes.  Sonographer:    Tinnie Barefoot RDCS Referring Phys: [id number] OZELL BUSHMAN   Sonographer Comments: Patient is obese. Image acquisition challenging due to patient body habitus. IMPRESSIONS   1. Left ventricular ejection fraction, by estimation, is 50 to 55%. Left ventricular ejection fraction by PLAX is 68 %. The left ventricle has low normal function. The left ventricle demonstrates global hypokinesis. There is moderate concentric left ventricular hypertrophy. Left ventricular diastolic parameters are consistent with Grade I diastolic dysfunction (impaired relaxation). 2. Right  ventricular systolic function is normal. The right ventricular size is normal. There is normal pulmonary artery systolic pressure. The estimated right ventricular systolic pressure is 17.4 mmHg. 3. The mitral valve is grossly normal. Trivial mitral valve regurgitation. 4. The aortic valve is tricuspid. Aortic valve regurgitation is not visualized. No aortic stenosis is present. 5. Aortic dilatation noted. There is borderline dilatation of the ascending aorta, measuring 38 mm. 6. The inferior vena cava is normal in size with greater than 50% respiratory variability, suggesting right atrial pressure of 3 mmHg.  Comparison(s): Changes from prior study are noted. 02/28/2021: LVEF 60-65%.  FINDINGS Left Ventricle: Left ventricular ejection fraction, by estimation, is 50 to 55%. Left ventricular ejection fraction by PLAX is 68 %. The left ventricle has low normal function. The left ventricle demonstrates global hypokinesis. The left ventricular internal cavity size was normal in size. There is moderate concentric left ventricular hypertrophy. Left ventricular diastolic parameters are consistent with Grade I diastolic dysfunction (impaired relaxation). Indeterminate filling pressures.  Right Ventricle: The right ventricular size is normal. No increase in right ventricular wall thickness. Right ventricular systolic function is normal. There is normal pulmonary artery systolic pressure. The tricuspid regurgitant velocity is 1.90 m/s, and with  an assumed right atrial pressure of 3 mmHg, the estimated right ventricular systolic pressure is 17.4 mmHg.  Left Atrium: Left atrial size was normal in size.  Right Atrium: Right atrial size was normal in size.  Pericardium: There is no evidence of pericardial effusion.  Mitral Valve: The mitral valve is grossly normal. Trivial mitral valve regurgitation.  Tricuspid Valve: The tricuspid valve is grossly normal. Tricuspid valve regurgitation is trivial.  Aortic  Valve: The aortic valve is tricuspid. Aortic valve regurgitation is not visualized. No aortic stenosis is present.  Pulmonic Valve: The pulmonic valve was grossly normal. Pulmonic valve regurgitation is trivial.  Aorta: Aortic dilatation noted. There is borderline dilatation of the ascending aorta, measuring 38 mm.  Venous: The inferior vena cava is normal in size with greater than 50% respiratory variability, suggesting right atrial pressure of 3 mmHg.  IAS/Shunts: No atrial level shunt detected by color flow Doppler.   LEFT VENTRICLE PLAX 2D LV EF:         Left            Diastology ventricular     LV e' medial:    7.83 cm/s ejection        LV E/e' medial:  8.8 fraction by     LV e' lateral:   8.05 cm/s PLAX is 68      LV E/e' lateral: 8.5 %. LVIDd:         5.20 cm LVIDs:         3.20 cm LV PW:         1.30 cm LV IVS:        1.50 cm LVOT diam:     2.20 cm LV SV:         82 LV SV Index:   36 LVOT Area:     3.80 cm  LV Volumes (MOD) LV vol d, MOD    109.0 ml A4C: LV vol s, MOD    54.4 ml A4C: LV SV MOD A4C:   109.0 ml  RIGHT VENTRICLE             IVC RV Basal diam:  3.30 cm     IVC diam: 1.80 cm RV S prime:     13.30 cm/s TAPSE (M-mode): 2.4 cm  LEFT ATRIUM           Index        RIGHT ATRIUM           Index LA diam:      4.00 cm 1.74 cm/m   RA Area:     21.60 cm LA Vol (A4C): 71.7 ml 31.16 ml/m  RA Volume:   65.90 ml  28.64 ml/m AORTIC VALVE LVOT Vmax:   92.50 cm/s LVOT Vmean:  57.700 cm/s LVOT VTI:    0.215 m  AORTA Ao Root diam: 3.20 cm Ao Asc diam:  3.80 cm  MITRAL VALVE               TRICUSPID VALVE MV Area (PHT): 2.87 cm    TR Peak grad:   14.4 mmHg MV Decel Time: 264 msec    TR Vmax:        190.00 cm/s MV E velocity: 68.60 cm/s MV A velocity: 80.20 cm/s  SHUNTS MV E/A ratio:  0.86        Systemic VTI:  0.22 m Systemic Diam: 2.20 cm  Vinie Maxcy MD Electronically signed by Vinie Maxcy MD Signature Date/Time: 03/12/2024/2:06:17  PM  Final    MONITORS  LONG TERM MONITOR (3-14 DAYS) 11/15/2020  Narrative  Predominant rhythm is still sinus rhythm. Minimum rate 43 bpm, maximum 130 bpm with an average of 64 bpm.  There were several (95 total) short burst episodes of Paroxysmal Atrial Tachycardia (PAT)/Supraventricular Tachycardia (SVT)-fastest was 5 beats at a rate at 2018 beats minute. Longest was for 25 seconds with an average rate of 122 bpm.  There was one short episode of what appeared to be atrial fibrillation/flutter lasting 25 minutes with an average rate of 98 beats a minute. The range was from 6992 bpm. There did appear to be some aberrancy in the conduction.  There were 15 episodes called ventricular tachycardia which appear to be more consistent with SVT or PAT conducted with aberrancy. Rates ranged from 113 to 114 bpm. The fastest and longest interval was 10 beats.  Besides these short bursts of arrhythmias, there is really rare premature atrial or premature ventricular beats. Somnolent couplets and triplets as far as PVCs.  Interestingly, symptoms were noted with normal heartbeat but normal rate. As well as some of the short fast heart rate spells.   Patch Wear Time:  13 days and 23 hours (2022-04-07T00:09:34-0400 to 2022-04-20T23:42:51-0400)   Monitor shows quite a bit of relatively short bursts of abnormal rhythms.  I suspect that the major Findings Are Paroxysmal Atrial Tachycardia as a harbinger of possible A. fib but the one prolonged episode of 25 minutes A. fib.  The episodes are suggestive as Ventricular Tachycardia appear to be aberrantly conducted Paroxysmal Atrial Tachycardia.  This is not consistent with POTS syndrome.  It is consistent with frequent bursts of fast heart rate spells. None of these options are significantly concerning from a risk standpoint.  We can discuss treatment options and clinic follow-up -> potentially recommend referral to electrophysiology to discuss other  treatment options if current plans next do not work.SABRA Joshua Clay, MD   CT SCANS  CT CORONARY MORPH W/CTA COR W/SCORE 03/12/2024  Addendum 03/12/2024  3:30 PM ADDENDUM REPORT: 03/12/2024 15:28  EXAM: OVER-READ INTERPRETATION  CT CHEST  The following report is an over-read performed by radiologist Dr. Andrea Gasman of [facility] on 03/12/2024. This over-read does not include interpretation of cardiac or coronary anatomy or pathology. The coronary CTA interpretation by the cardiologist is attached.  COMPARISON:  None.  FINDINGS: Vascular: No aortic atherosclerosis. The included aorta is normal in caliber.  Mediastinum/nodes: No adenopathy or mass. High-density material in the esophagus consistent with ingested material.  Lungs: No focal airspace disease. No pulmonary nodule. No pleural fluid. The included airways are patent.  Upper abdomen: No acute or unexpected findings.  Musculoskeletal: There are no acute or suspicious osseous abnormalities. Thoracic spondylosis.  IMPRESSION: No acute or significant extracardiac findings.   Electronically Signed By: Andrea Gasman M.D. On: 03/12/2024 15:28  Narrative CLINICAL DATA:  Chest pain  EXAM: Cardiac/Coronary CTA  TECHNIQUE: A non-contrast, gated CT scan was obtained with axial slices of 3 mm through the heart for calcium  scoring. Calcium  scoring was performed using the Agatston method. A 120 kV prospective, gated, contrast cardiac scan was obtained. Gantry rotation speed was 250 msecs and collimation was 0.6 mm. Two sublingual nitroglycerin  tablets (0.8 mg) were given. The 3D data set was reconstructed in 5% intervals of the 35-75% of the R-R cycle. Diastolic phases were analyzed on a dedicated workstation using MPR, MIP, and VRT modes. The patient received 95 cc of contrast.  FINDINGS: Image  quality: Good  Noise artifact is: Moderate  Coronary Arteries:  Normal coronary origin.  Left  dominance.  Left main: The left main is a large caliber vessel with a normal take off from the left coronary cusp that bifurcates to form a left anterior descending artery and a left circumflex artery. There is no plaque or stenosis.  Left anterior descending artery: The LAD is patent without evidence of plaque or stenosis. The LAD gives off 2 patent diagonal branches.  Left circumflex artery: The LCX is dominant and patent with no evidence of plaque or stenosis. The LCX gives off 2 patent obtuse marginal branches as well as an LPLA and LPDA branches with no stenosis.  Right coronary artery: The RCA is non dominant with normal take off from the right coronary cusp. There is no evidence of plaque or stenosis.  Right Atrium: Right atrial size is within normal limits.  Right Ventricle: The right ventricular cavity is within normal limits.  Left Atrium: Left atrial size is normal in size with no left atrial appendage filling defect.  Left Ventricle: The ventricular cavity size is within normal limits.  Pulmonary arteries: Normal in size.  Pulmonary veins: Normal pulmonary venous drainage.  Pericardium: Normal thickness without significant effusion or calcium  present.  Cardiac valves: The aortic valve is trileaflet without significant calcification. The mitral valve is normal without significant calcification.  Aorta: Normal caliber without significant disease.  Extra-cardiac findings: See attached radiology report for non-cardiac structures.  CLINICAL DATA:  Atrial fibrillation scheduled for ablation.  EXAM: Cardiac CTA  TECHNIQUE: A non-contrast, gated CT scan was obtained with axial slices of 3 mm through the heart for calcium  scoring. Calcium  scoring was performed using the Agatston method. A 120 kV retrospective, gated, contrast cardiac scan was obtained. Gantry rotation speed was 250 msecs and collimation was 0.6 mm. Nitroglycerin  was not given. A delayed  scan was obtained to exclude left atrial appendage thrombus. The 3D dataset was reconstructed in 5% intervals of the 0-95% of the R-R cycle. Late systolic phases were analyzed on a dedicated workstation using MPR, MIP, and VRT modes. The patient received 80 cc of contrast.  FINDINGS: Image quality: Average.  Slab artifact.  Noise artifact is: Limited.  Pulmonary Veins: There is normal pulmonary vein drainage into the left atrium (2 on the right and 2 on the left) with ostial measurements as follows:  RUPV:  Ostium 26.35mm x 16.34mm  area 3.30cm2  RLPV:  Ostium 19.60mm x 14.36mm  area 2.13cm2  LUPV:  Ostium  22.19mm x 13mm area 2.20cm2  LLPV:  Ostium 14.76mm x 8.76mm  Area 0.99cm2  Left Atrium: The left atrial size is normal. There is no PFO/ASD. The left atrial appendage is large broccoli type with two lobes and ostial size 19.5 mm and length 40.3 mm. There is no thrombus in the left atrial appendage on contrast or delayed imaging. The esophagus runs to the left of the atrial midline and is in proximity to the Left pulmonary vein ostia.  Coronary Arteries: CAC score of 0, which is 0 percentile for age-, race-, and sex-matched controls. Normal coronary origin. Left dominance. The study was performed without use of NTG and is insufficient for plaque evaluation.  Right Atrium: Right atrial size is within normal limits.  Right Ventricle: The right ventricular cavity is within normal limits.  Left Ventricle: The ventricular cavity size is within normal limits. There are no stigmata of prior infarction. There is no abnormal filling defect.  Pericardium: Normal  thickness with no significant effusion or calcium  present.  Pulmonary Artery: Normal caliber without proximal filling defect.  Cardiac valves: The aortic valve is trileaflet without significant calcification. The mitral valve is normal structure without significant calcification.  Aorta: Normal caliber with no  significant disease.  Extra-cardiac findings: See attached radiology report for non-cardiac structures.  IMPRESSION: 1. There is normal pulmonary vein drainage into the left atrium with ostial measurements above.  2. There is no thrombus in the left atrial appendage.  3. The esophagus runs to the left of the atrial midline and is in proximity to the Left pulmonary vein ostia.  4. No PFO/ASD.  5. Normal coronary origin. Left dominance.  6. CAC score of 0 which is 0 percentile for age-, race-, and sex-matched controls.  Wilbert Bihari, MD  1. Coronary calcium  score of 0. This was 0 percentile for age-, sex, and race-matched controls.  2. Plaque data not available.  3. Normal coronary origin with left dominance.  4. Normal coronary arteries.  RECOMMENDATIONS: 1. CAD-RADS 0: No evidence of CAD (0%). Consider non-atherosclerotic causes of chest pain.  2. CAD-RADS 1: Minimal non-obstructive CAD (0-24%). Consider non-atherosclerotic causes of chest pain. Consider preventive therapy and risk factor modification.  3. CAD-RADS 2: Mild non-obstructive CAD (25-49%). Consider non-atherosclerotic causes of chest pain. Consider preventive therapy and risk factor modification.  4. CAD-RADS 3: Moderate stenosis. Consider symptom-guided anti-ischemic pharmacotherapy as well as risk factor modification per guideline directed care. Additional analysis with CT FFR will be submitted.  5. CAD-RADS 4: Severe stenosis. (70-99% or > 50% left main). Cardiac catheterization or CT FFR is recommended. Consider symptom-guided anti-ischemic pharmacotherapy as well as risk factor modification per guideline directed care. Invasive coronary angiography recommended with revascularization per published guideline statements.  6. CAD-RADS 5: Total coronary occlusion (100%). Consider cardiac catheterization or viability assessment. Consider symptom-guided anti-ischemic pharmacotherapy as well as risk  factor modification per guideline directed care.  7. CAD-RADS N: Non-diagnostic study. Obstructive CAD can't be excluded. Alternative evaluation is recommended.  Wilbert Bihari, MD  Electronically Signed: By: Wilbert Bihari M.D. On: 03/12/2024 15:08     ______________________________________________________________________________________________      EKG Interpretation Date/Time:  Thursday August 12 2024 14:03:02 EST Ventricular Rate:  71 PR Interval:  216 QRS Duration:  108 QT Interval:  408 QTC Calculation: 443 R Axis:   -27  Text Interpretation: Sinus rhythm with 1st degree A-V block Left ventricular hypertrophy No significant change was found Confirmed by Nola Numbers ([id number]) on 08/12/2024 2:11:48 PM            Physical Exam: VS: BP (!) 148/90 (BP Location: Left Arm, Patient Position: Sitting, Cuff Size: Large)   Pulse 71   Ht 5' 7 (1.702 m)   Wt 272 lb 6.4 oz (123.6 kg)   SpO2 96%   BMI 42.66 kg/m  Wt Readings from Last 3 Encounters:  08/12/24 272 lb 6.4 oz (123.6 kg)  07/01/24 273 lb (123.8 kg)  05/07/24 265 lb 12.8 oz (120.6 kg)    GEN: Well nourished, in NAD HEENT: Normal NECK: No JVD CARDIAC: RRR, one PVC RESPIRATORY: Clear to auscultation bilaterally ABDOMEN: Soft, non-tender, non-distended MUSCULOSKELETAL: No edema SKIN: Warm and dry NEUROLOGIC:  Alert and oriented x 3 PSYCHIATRIC:  Normal affect   Assessment & Plan: 1. Hypertension: BP today 148/90, slightly lower than home readings. His cuff was verified to be accurate today.  EKG today with evidence of LVH, echo 02/20/2024 showed moderate LVH.  eGFR 68 on  03/08/2024, potassium 4.8 on 07/29/2024. - Start chlorthalidone  25 mg daily - check BMP in 1-2 weeks - Continue valsartan  320 mg daily  2. PAF: He is s/p ablation 03/2024.  EKG today shows NSR 71 bpm.  Noted one possible PVC on auscultation today.  He was reassured that his relative bradycardia on Kardia mobile device was safe given that he is  asymptomatic.  We discussed continuation of diltiazem  at current dose to prevent recurrence of atrial fibrillation, he was agreeable.  Xarelto  was discontinued 06/2024 in the [facility]. - Continue diltiazem  120 mg daily  3. Precordial pain: Had coronary CTA 02/2024 prior to atrial fibrillation ablation that showed calcium  score of 0. EKG today shows NSR without ischemic changes.  His symptoms are atypical, further ischemic evaluation deferred at this time.  4. OSA: He wears CPAP every night. - Recommended continued compliance  Dispo: Follow-up as scheduled with Dr. Inocencio in 6 months or sooner if needed.  Signed, Saddie GORMAN Cleaves, NP 08/12/2024 4:21 PM [facility] "
